# Patient Record
Sex: Female | Born: 1950 | Race: White | Hispanic: No | Marital: Married | Smoking: Former smoker
Health system: Southern US, Community
[De-identification: ages and names within clinical notes are randomized; demographics above are authoritative.]

## PROBLEM LIST (undated history)

## (undated) DIAGNOSIS — J90 Pleural effusion, not elsewhere classified: Secondary | ICD-10-CM

## (undated) DIAGNOSIS — I5042 Chronic combined systolic (congestive) and diastolic (congestive) heart failure: Secondary | ICD-10-CM

## (undated) DIAGNOSIS — I340 Nonrheumatic mitral (valve) insufficiency: Secondary | ICD-10-CM

## (undated) DIAGNOSIS — E119 Type 2 diabetes mellitus without complications: Secondary | ICD-10-CM

## (undated) DIAGNOSIS — N182 Chronic kidney disease, stage 2 (mild): Secondary | ICD-10-CM

## (undated) DIAGNOSIS — I10 Essential (primary) hypertension: Secondary | ICD-10-CM

## (undated) DIAGNOSIS — I272 Pulmonary hypertension, unspecified: Secondary | ICD-10-CM

## (undated) DIAGNOSIS — E785 Hyperlipidemia, unspecified: Secondary | ICD-10-CM

## (undated) DIAGNOSIS — I82409 Acute embolism and thrombosis of unspecified deep veins of unspecified lower extremity: Secondary | ICD-10-CM

## (undated) DIAGNOSIS — F32A Depression, unspecified: Secondary | ICD-10-CM

## (undated) DIAGNOSIS — R062 Wheezing: Secondary | ICD-10-CM

## (undated) DIAGNOSIS — I313 Pericardial effusion (noninflammatory): Secondary | ICD-10-CM

## (undated) DIAGNOSIS — I3139 Other pericardial effusion (noninflammatory): Secondary | ICD-10-CM

## (undated) DIAGNOSIS — I071 Rheumatic tricuspid insufficiency: Secondary | ICD-10-CM

## (undated) DIAGNOSIS — I5043 Acute on chronic combined systolic (congestive) and diastolic (congestive) heart failure: Secondary | ICD-10-CM

## (undated) DIAGNOSIS — D509 Iron deficiency anemia, unspecified: Secondary | ICD-10-CM

## (undated) DIAGNOSIS — F329 Major depressive disorder, single episode, unspecified: Secondary | ICD-10-CM

## (undated) HISTORY — PX: TONSILLECTOMY: SUR1361

## (undated) HISTORY — DX: Type 2 diabetes mellitus without complications: E11.9

## (undated) HISTORY — DX: Acute on chronic combined systolic (congestive) and diastolic (congestive) heart failure: I50.43

## (undated) HISTORY — DX: Essential (primary) hypertension: I10

## (undated) HISTORY — PX: APPENDECTOMY: SHX54

## (undated) HISTORY — PX: OTHER SURGICAL HISTORY: SHX169

## (undated) HISTORY — DX: Pleural effusion, not elsewhere classified: J90

---

## 2003-07-03 ENCOUNTER — Encounter (INDEPENDENT_AMBULATORY_CARE_PROVIDER_SITE_OTHER): Payer: Self-pay | Admitting: *Deleted

## 2003-07-03 ENCOUNTER — Encounter (INDEPENDENT_AMBULATORY_CARE_PROVIDER_SITE_OTHER): Payer: Self-pay | Admitting: Specialist

## 2003-07-03 ENCOUNTER — Ambulatory Visit (HOSPITAL_COMMUNITY): Admission: RE | Admit: 2003-07-03 | Discharge: 2003-07-03 | Payer: Self-pay | Admitting: Obstetrics and Gynecology

## 2004-05-17 ENCOUNTER — Other Ambulatory Visit: Admission: RE | Admit: 2004-05-17 | Discharge: 2004-05-17 | Payer: Self-pay | Admitting: Obstetrics and Gynecology

## 2006-07-17 ENCOUNTER — Encounter (INDEPENDENT_AMBULATORY_CARE_PROVIDER_SITE_OTHER): Payer: Self-pay | Admitting: *Deleted

## 2006-07-17 ENCOUNTER — Inpatient Hospital Stay (HOSPITAL_COMMUNITY): Admission: EM | Admit: 2006-07-17 | Discharge: 2006-07-21 | Payer: Self-pay | Admitting: Emergency Medicine

## 2008-04-14 IMAGING — CT CT ABDOMEN W/ CM
2 of 5 series · 13 of 32 positions shown, 18 images · IV contrast ([ID]/WATER & 100 ML OMNI 300)
Comparison: No comparison films available.

CLINICAL DATA: Abdominal and pelvic pain with right lower quadrant pain.  Nausea, 
ABDOMEN CT WITH CONTRAST:
TECHNIQUE: Multidetector CT imaging of the abdomen was performed following the standard protocol during bolus administration of intravenous contrast.
Contrast:  100 cc Omnipaque 300 and oral contrast.
TECHNIQUE: Multidetector CT imaging of the pelvis was performed following the standard protocol during bolus administration of intravenous contrast.

[Series 2: routine abdomen · axial · 0.75mm/px · z∈[-289,+1]mm · 5 of 88 slices shown, 10 images]
[im 15/88  soft-tissue]
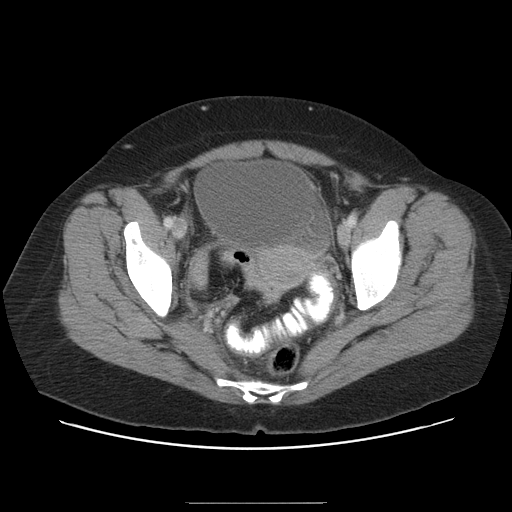
[im 15/88  bone]
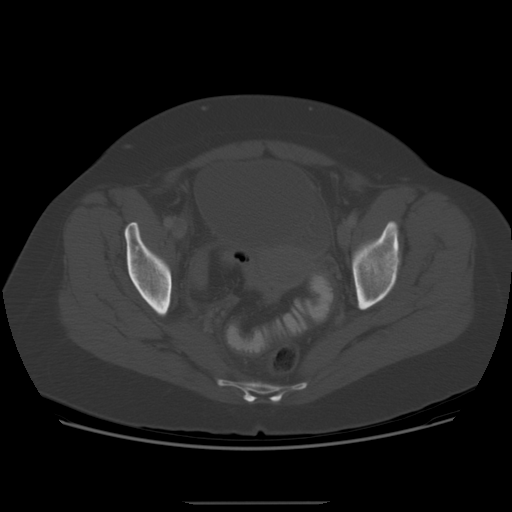
[im 30/88  soft-tissue]
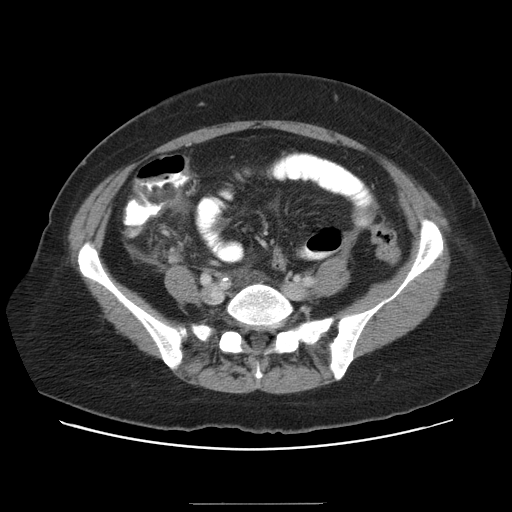
[im 30/88  lung]
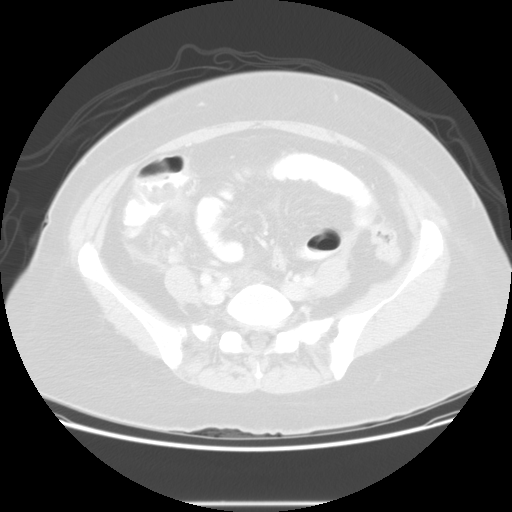
[im 44/88  soft-tissue]
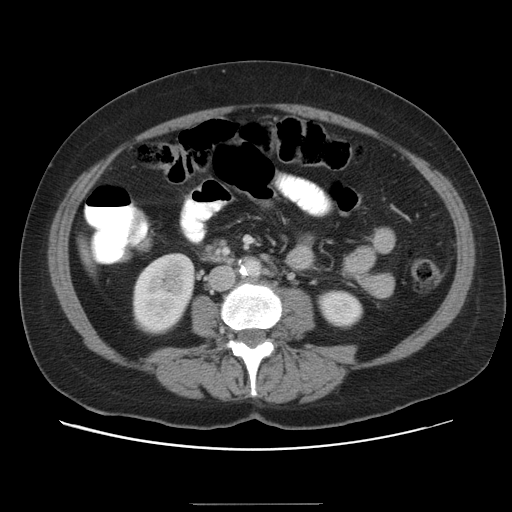
[im 44/88  lung]
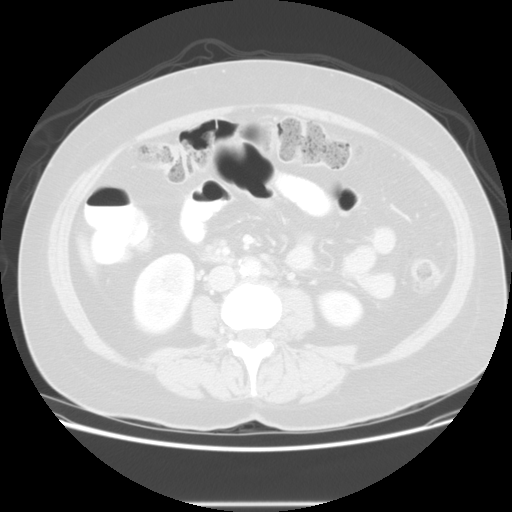
[im 59/88  soft-tissue]
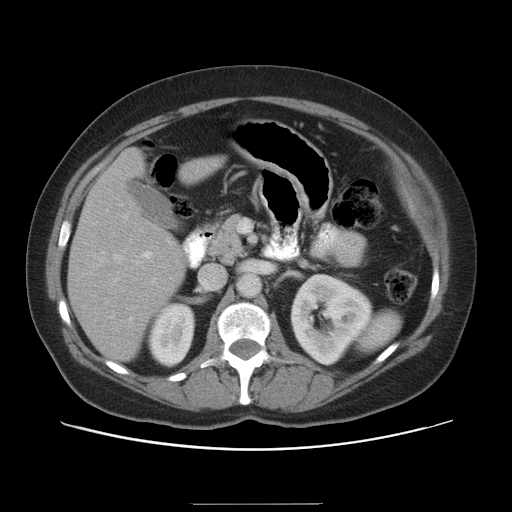
[im 59/88  lung]
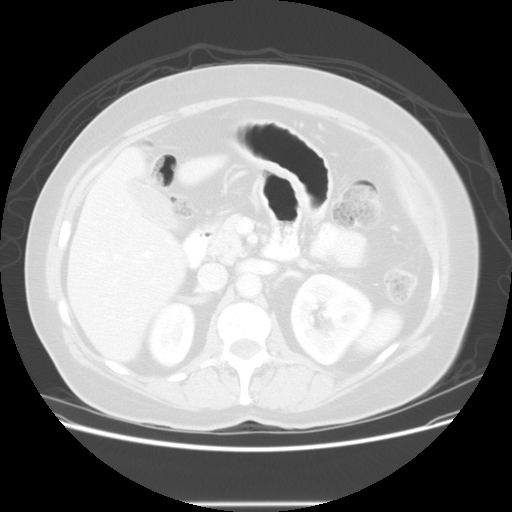
[im 73/88  soft-tissue]
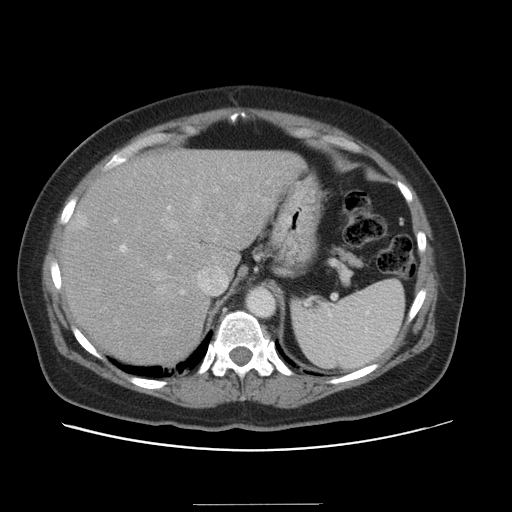
[im 73/88  lung]
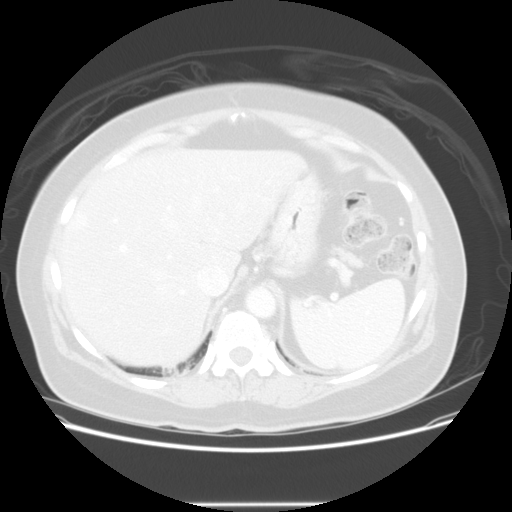

[Series 401: reformatted · sagittal · 0.78mm/px · 8 of 166 slices shown]
[im 16/166  soft-tissue]
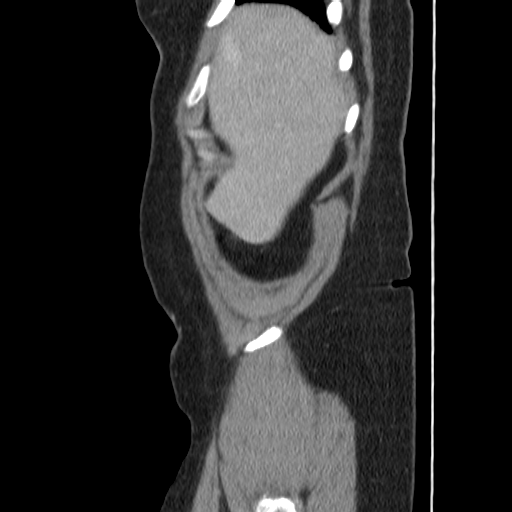
[im 31/166  soft-tissue]
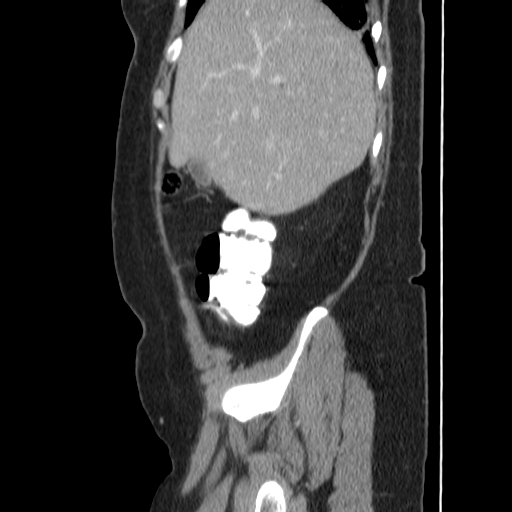
[im 61/166  soft-tissue]
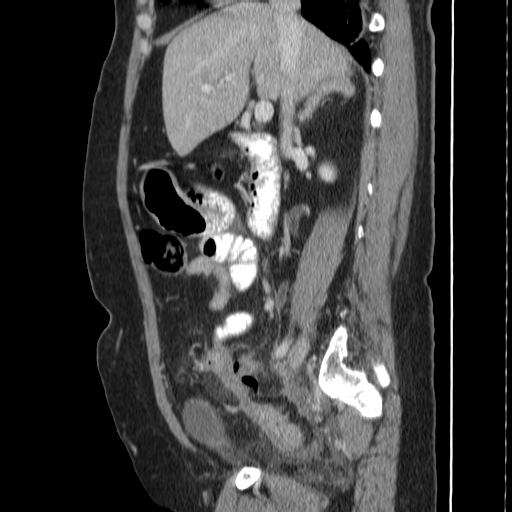
[im 76/166  soft-tissue]
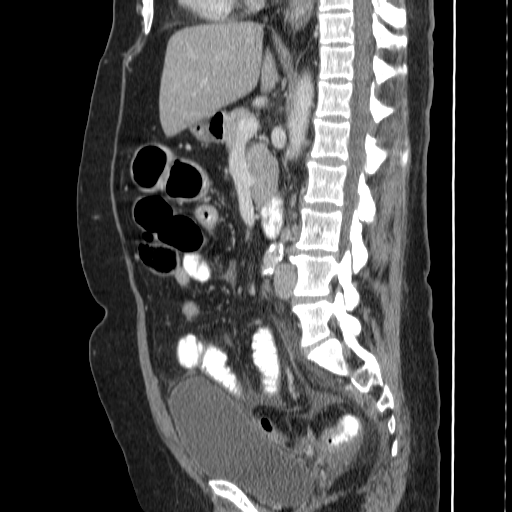
[im 91/166  soft-tissue]
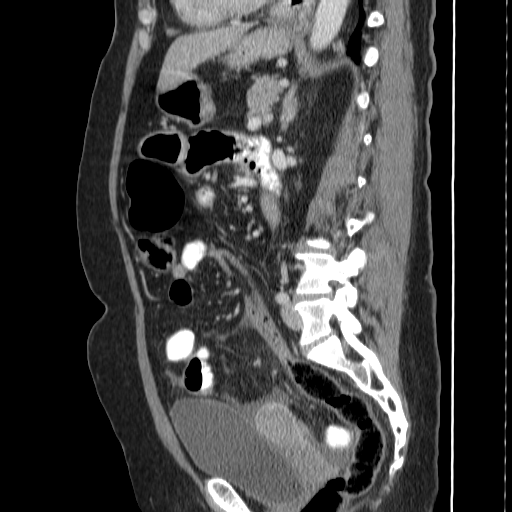
[im 106/166  soft-tissue]
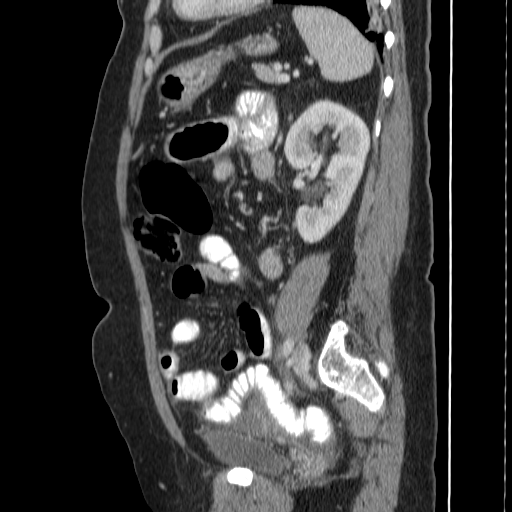
[im 136/166  soft-tissue]
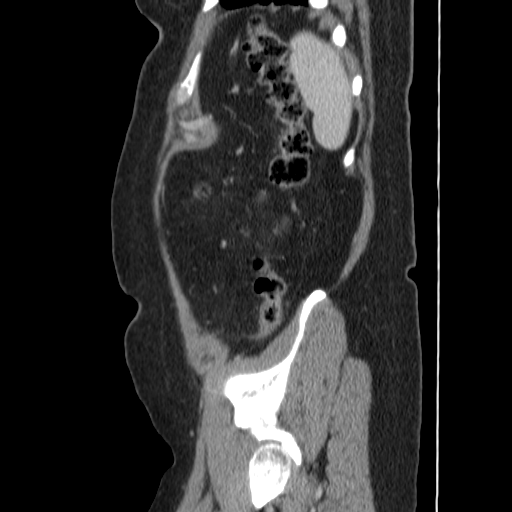
[im 151/166  soft-tissue]
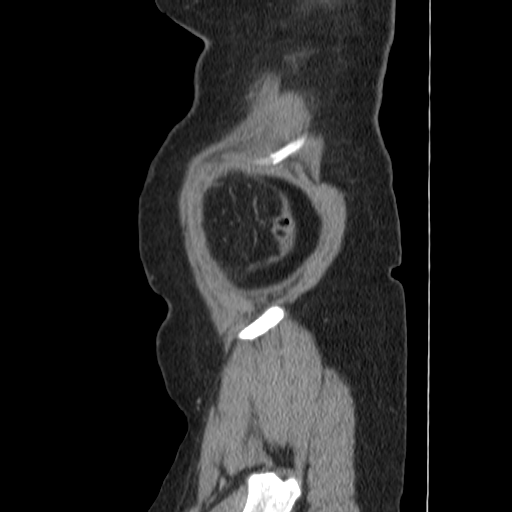

[13 of 32 positions shown; findings below may reference images not displayed]

FINDINGS: A few tiny low density hepatic lesions are too small to characterize, but probably represent cysts.  A 1.2 x 1.4 cm hyperdense/enhancing lesion within the anterior right hepatic segment (image 16) is indeterminate.  The remainder of the liver is unremarkable.  The spleen, adrenal glands, pancreas, gallbladder, and kidneys are unremarkable except for probable bilateral renal cysts.  Visualized bowel within the abdomen is unremarkable.  A small umbilical hernia containing fat is noted.  Moderate hiatal hernia and mild basilar atelectasis is identified.
IMPRESSION: 1.  No acute abnormality within the abdomen. 
2.  Moderate hiatal hernia.  
3.  Hepatic lesions as described.  
PELVIS CT WITH CONTRAST:
FINDINGS: There is inflammation within the right pelvis with enhancement of the appendiceal wall.  There is a small amount of free fluid in the right pelvis as well as a probable focus of pneumoperitoneum.  There is wall thickening of the cecum and distal ileum which may be reactive but nonspecific.  The bowel is within normal limits.
IMPRESSION: Findings compatible with appendicitis with probable perforation.  Wall thickening of the distal ileum and cecum may be reactive.  Dr. Toriso notified of these results at [DATE] a.m. on 07/17/06.

## 2008-04-14 IMAGING — CR DG CHEST 2V
2 series · 2 of 2 positions shown · non-contrast
Comparison: None.

CLINICAL DATA: Abdominal pain.  Preop appendectomy.
 CHEST ? 2 VIEW:

[w chest pa]
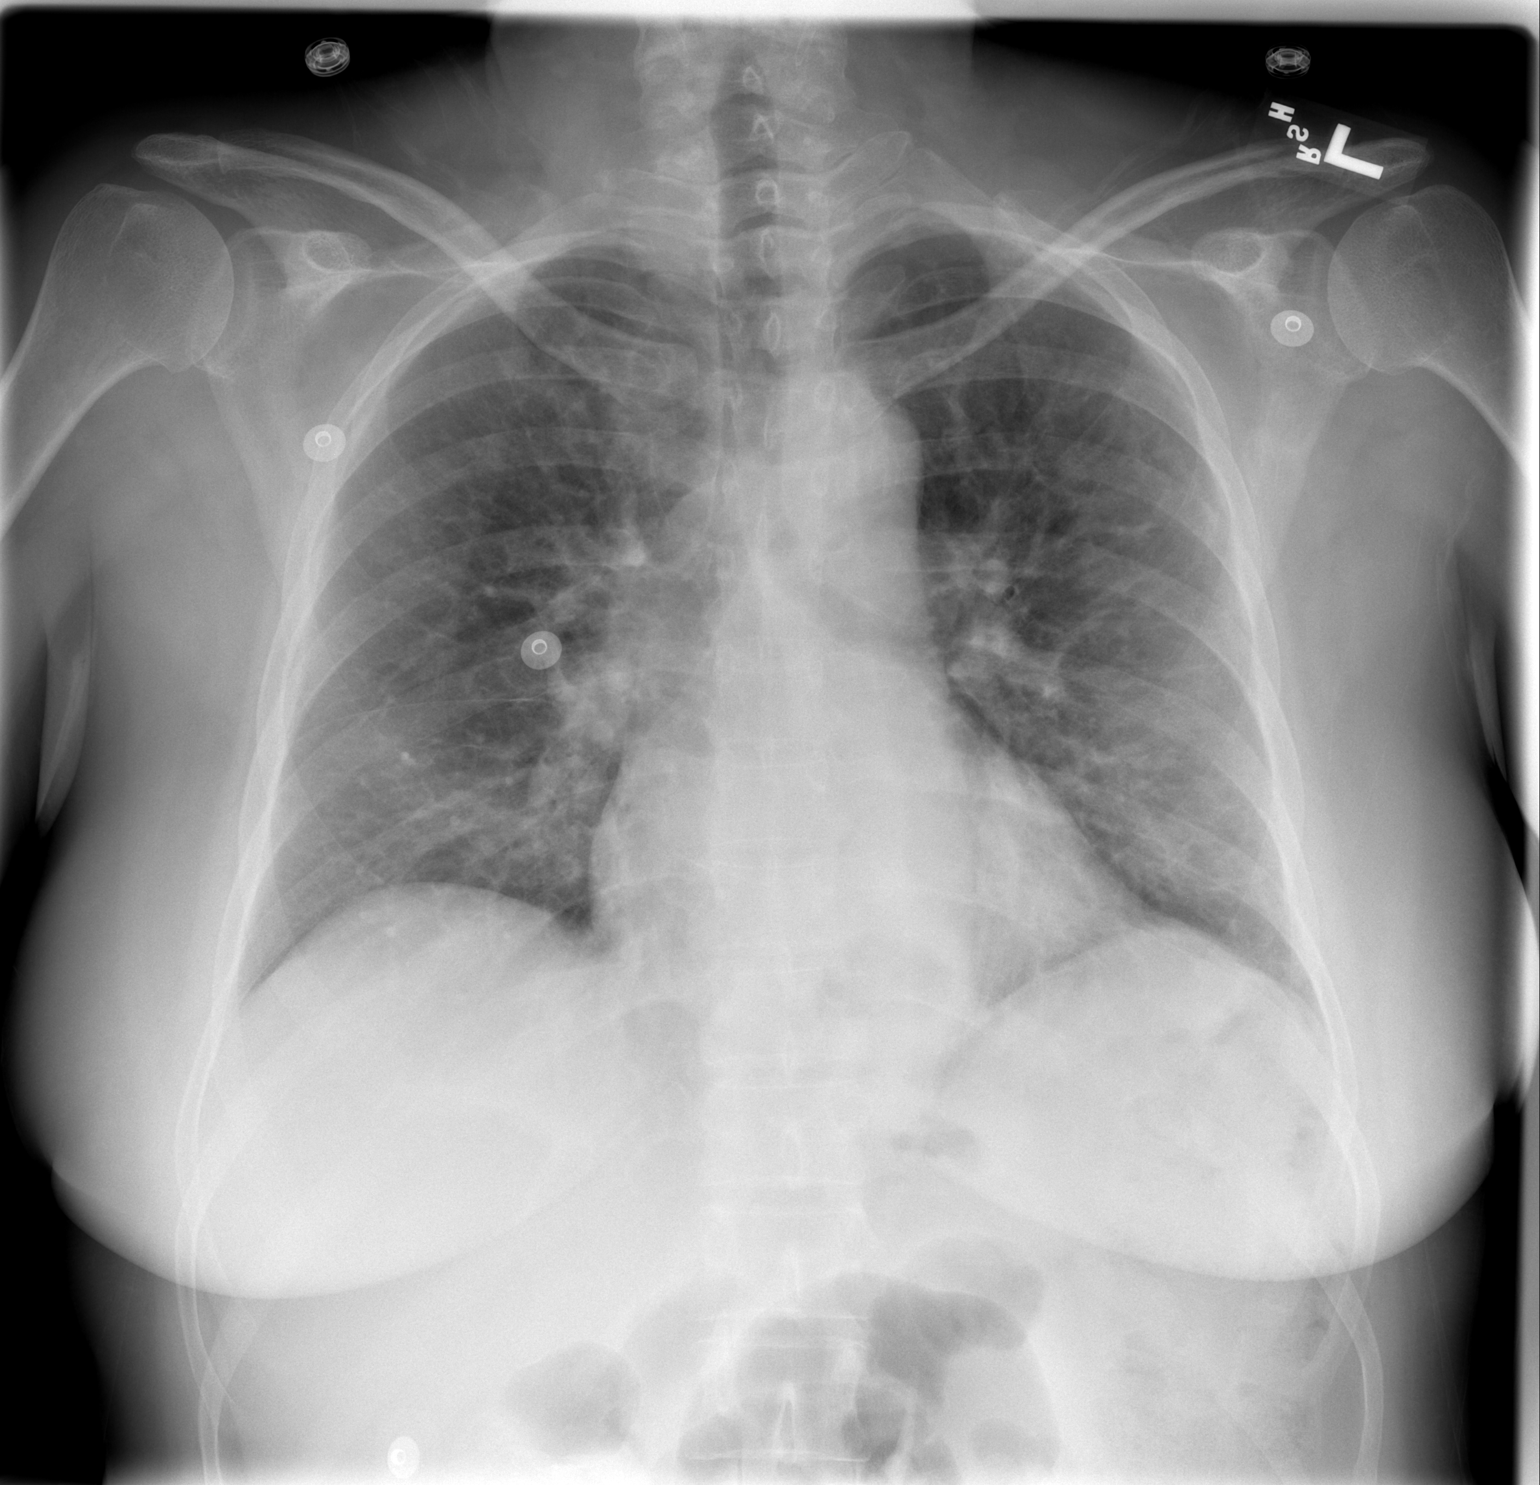

[w chest lat]
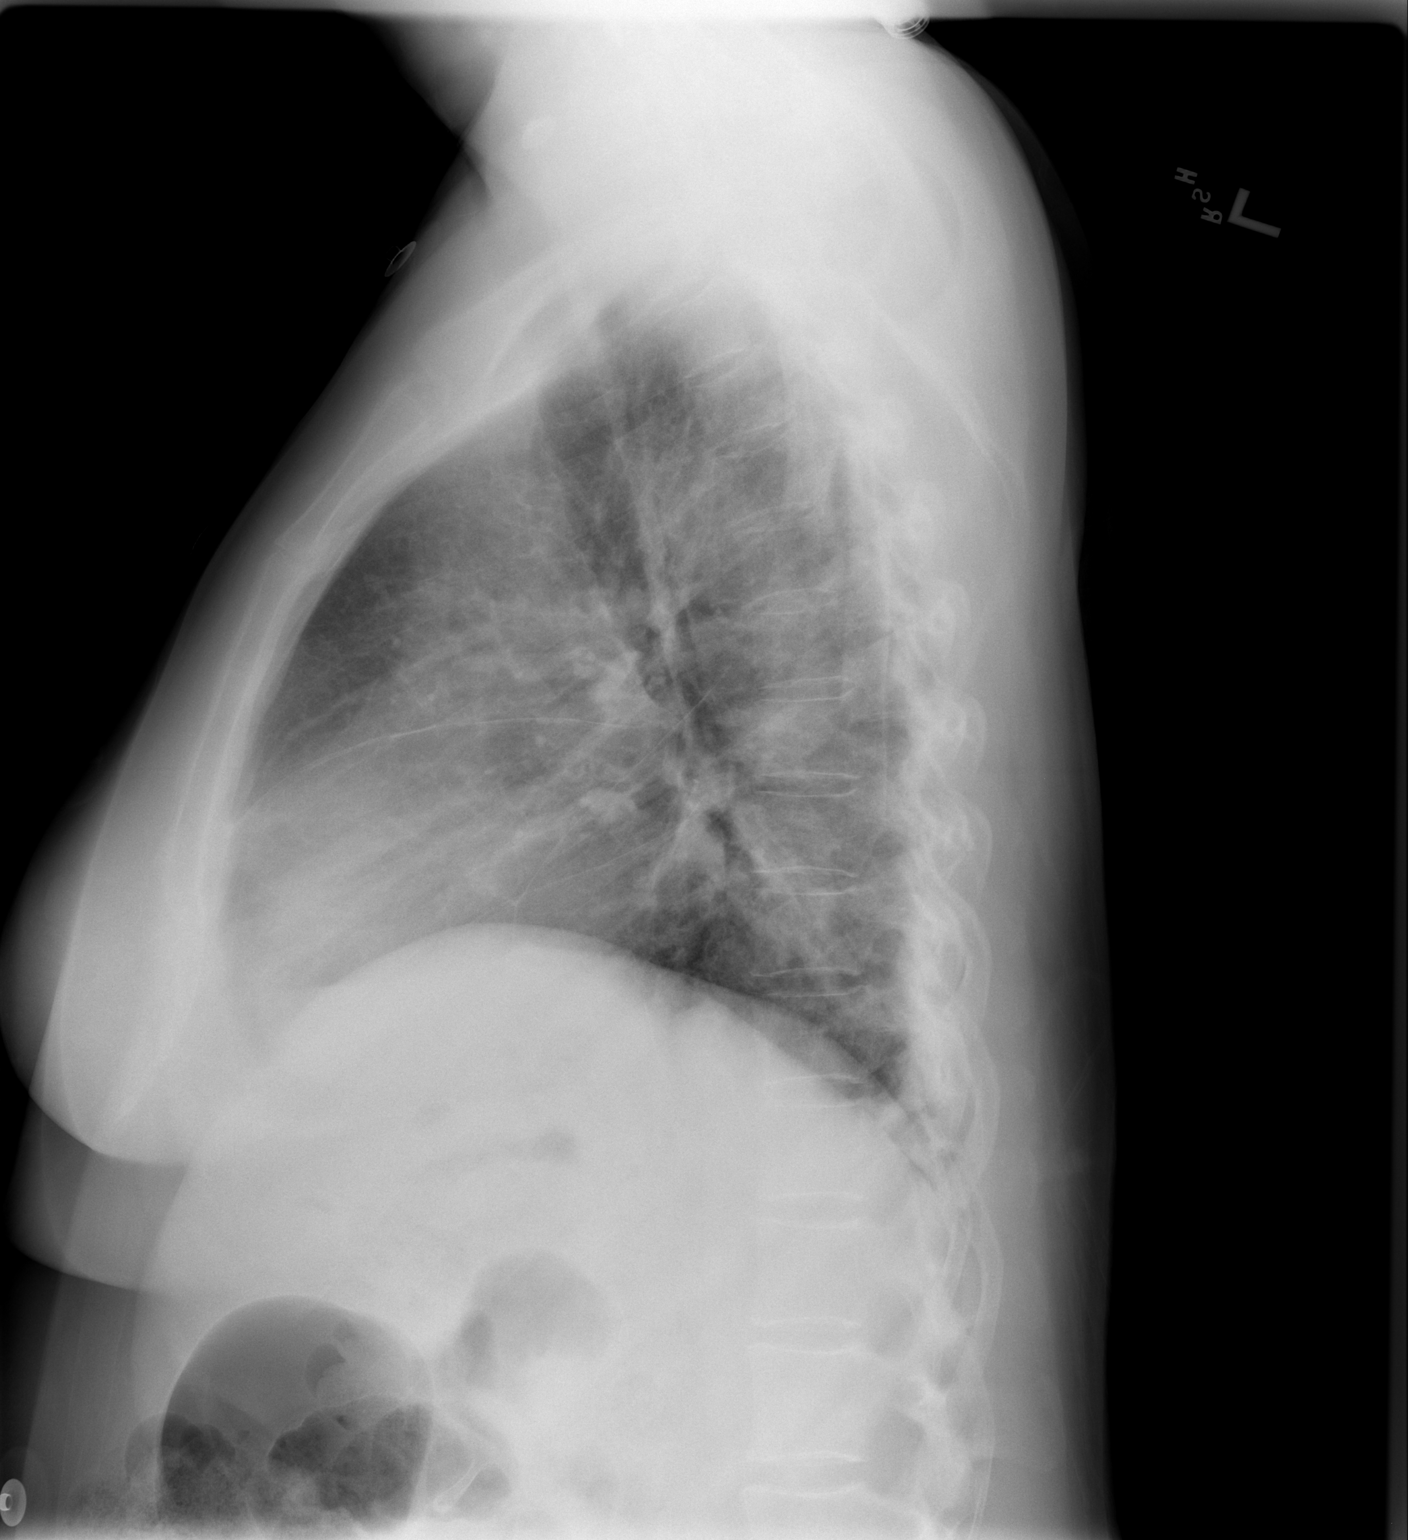

[2 of 2 positions shown; findings below may reference images not displayed]

FINDINGS: Heart size is normal.  Lung markings are prominent.  This could be due to chronic lung disease or possibly acute lung disease such as bronchitis.  No definite heart failure.  There is no effusion.  There is some mild left lower lobe atelectasis or infiltrate.
IMPRESSION: Prominent lung markings with some peribronchial thickening possibly chronic but no prior studies are available.  There is some mild left lower lobe airspace disease.

## 2010-03-23 ENCOUNTER — Encounter (INDEPENDENT_AMBULATORY_CARE_PROVIDER_SITE_OTHER): Payer: Self-pay | Admitting: *Deleted

## 2010-03-24 ENCOUNTER — Encounter: Payer: Self-pay | Admitting: Gastroenterology

## 2010-03-24 ENCOUNTER — Telehealth (INDEPENDENT_AMBULATORY_CARE_PROVIDER_SITE_OTHER): Payer: Self-pay | Admitting: *Deleted

## 2010-03-25 ENCOUNTER — Ambulatory Visit: Payer: Self-pay | Admitting: Gastroenterology

## 2010-03-25 ENCOUNTER — Encounter: Payer: Self-pay | Admitting: Gastroenterology

## 2010-03-25 ENCOUNTER — Encounter (INDEPENDENT_AMBULATORY_CARE_PROVIDER_SITE_OTHER): Payer: Self-pay | Admitting: *Deleted

## 2010-03-25 DIAGNOSIS — D649 Anemia, unspecified: Secondary | ICD-10-CM | POA: Insufficient documentation

## 2010-03-28 LAB — CONVERTED CEMR LAB
Basophils Relative: 0.4 % (ref 0.0–3.0)
Eosinophils Absolute: 0.1 10*3/uL (ref 0.0–0.7)
Eosinophils Relative: 1 % (ref 0.0–5.0)
Ferritin: 3 ng/mL — ABNORMAL LOW (ref 10.0–291.0)
IgA: 118 mg/dL (ref 68–378)
Lymphocytes Relative: 19.7 % (ref 12.0–46.0)
Monocytes Relative: 5.4 % (ref 3.0–12.0)
Neutrophils Relative %: 73.5 % (ref 43.0–77.0)
RBC: 3.84 M/uL — ABNORMAL LOW (ref 3.87–5.11)
Saturation Ratios: 3.3 % — ABNORMAL LOW (ref 20.0–50.0)
WBC: 12.9 10*3/uL — ABNORMAL HIGH (ref 4.5–10.5)

## 2010-03-29 ENCOUNTER — Ambulatory Visit (HOSPITAL_COMMUNITY)
Admission: RE | Admit: 2010-03-29 | Discharge: 2010-03-29 | Payer: Self-pay | Source: Home / Self Care | Attending: Gastroenterology | Admitting: Gastroenterology

## 2010-03-29 ENCOUNTER — Encounter: Payer: Self-pay | Admitting: Gastroenterology

## 2010-05-16 ENCOUNTER — Ambulatory Visit
Admission: RE | Admit: 2010-05-16 | Discharge: 2010-05-16 | Payer: Self-pay | Source: Home / Self Care | Attending: Gastroenterology | Admitting: Gastroenterology

## 2010-05-16 ENCOUNTER — Other Ambulatory Visit: Payer: Self-pay | Admitting: Gastroenterology

## 2010-05-16 LAB — CBC WITH DIFFERENTIAL/PLATELET
Basophils Relative: 0.7 % (ref 0.0–3.0)
HCT: 39.9 % (ref 36.0–46.0)
Hemoglobin: 12.7 g/dL (ref 12.0–15.0)
Lymphocytes Relative: 28 % (ref 12.0–46.0)
Lymphs Abs: 2.6 10*3/uL (ref 0.7–4.0)
Monocytes Relative: 6.3 % (ref 3.0–12.0)
Neutro Abs: 5.9 10*3/uL (ref 1.4–7.7)
RBC: 5.04 Mil/uL (ref 3.87–5.11)

## 2010-05-17 ENCOUNTER — Ambulatory Visit
Admission: RE | Admit: 2010-05-17 | Discharge: 2010-05-17 | Payer: Self-pay | Source: Home / Self Care | Attending: Gastroenterology | Admitting: Gastroenterology

## 2010-05-17 NOTE — Letter (Signed)
Summary: [facility] Instructions  [facility]  [address] [place], [place] [address]   Phone: [phone]  Fax: [phone]       [name]    [birth year]    MRN: [id number]        Procedure Day /Date:03/29/10 TUE     Arrival Time:730 am     Procedure Time:830 am     Location of Procedure:                     X  [facility] ( Outpatient Registration)                        PREPARATION FOR COLONOSCOPY WITH MOVIPREP   Starting 5 days prior to your procedure 12/8/11do not eat nuts, seeds, popcorn, corn, beans, peas,  salads, or any raw vegetables.  Do not take any fiber supplements (e.g. Metamucil, Citrucel, and Benefiber).  THE DAY BEFORE YOUR PROCEDURE         DATE: 03/28/10  DAY: MON  1.  Drink clear liquids the entire day-NO SOLID FOOD  2.  Do not drink anything colored red or purple.  Avoid juices with pulp.  No orange juice.  3.  Drink at least 64 oz. (8 glasses) of fluid/clear liquids during the day to prevent dehydration and help the prep work efficiently.  CLEAR LIQUIDS INCLUDE: Water Jello Ice Popsicles Tea (sugar ok, no milk/cream) Powdered fruit flavored drinks Coffee (sugar ok, no milk/cream) Gatorade Juice: apple, white grape, white cranberry  Lemonade Clear bullion, consomm, broth Carbonated beverages (any kind) Strained chicken noodle soup Hard Candy                             4.  In the morning, mix first dose of MoviPrep solution:    Empty 1 Pouch A and 1 Pouch B into the disposable container    Add lukewarm drinking water to the top line of the container. Mix to dissolve    Refrigerate (mixed solution should be used within 24 hrs)  5.  Begin drinking the prep at 5:00 p.m. The MoviPrep container is divided by 4 marks.   Every 15 minutes drink the solution down to the next mark (approximately 8 oz) until the full liter is complete.   6.  Follow completed prep with 16 oz of clear liquid of your choice (Nothing  red or purple).  Continue to drink clear liquids until bedtime.  7.  Before going to bed, mix second dose of MoviPrep solution:    Empty 1 Pouch A and 1 Pouch B into the disposable container    Add lukewarm drinking water to the top line of the container. Mix to dissolve    Refrigerate  THE DAY OF YOUR PROCEDURE      DATE: TUE DAY:03/29/10  Beginning at 330 a.m. (5 hours before procedure):         1. Every 15 minutes, drink the solution down to the next mark (approx 8 oz) until the full liter is complete.  2. Follow completed prep with 16 oz. of clear liquid of your choice.    3. You may drink clear liquids until 430 am (4 HOURS BEFORE PROCEDURE).   MEDICATION INSTRUCTIONS  Unless otherwise instructed, you should take regular prescription medications with a small sip of water   as early as possible the morning of your procedure.  OTHER INSTRUCTIONS  You will need a responsible adult at least 60 years of age to accompany you and drive you home.   This person must remain in the waiting room during your procedure.  Wear loose fitting clothing that is easily removed.  Leave jewelry and other valuables at home.  However, you may wish to bring a book to read or  an iPod/MP3 player to listen to music as you wait for your procedure to start.  Remove all body piercing jewelry and leave at home.  Total time from sign-in until discharge is approximately 2-3 hours.  You should go home directly after your procedure and rest.  You can resume normal activities the  day after your procedure.  The day of your procedure you should not:   Drive   Make legal decisions   Operate machinery   Drink alcohol   Return to work  You will receive specific instructions about eating, activities and medications before you leave.    The above instructions have been reviewed and explained to me by   _______________________    I fully understand and can verbalize these  instructions _____________________________ Date _________

## 2010-05-17 NOTE — Progress Notes (Signed)
Summary: Colon info needed  Phone Note Outgoing Call Call back at Home Phone [phone]   Call placed by: Chales Abrahams CMA Duncan Dull),  March 24, 2010 11:17 AM Summary of Call: in reviewing records pt states she had colon in 2000, placed a call to her to get the info so that we wil have it for her appt on friday with Dr Christella Hartigan.  message left with family member to have her call us. Initial call taken by: Chales Abrahams CMA Duncan Dull),  March 24, 2010 11:18 AM  Follow-up for Phone Call        pt does not remember who did her colon or where it was  Follow-up by: Chales Abrahams CMA Duncan Dull),  March 24, 2010 2:48 PM

## 2010-05-17 NOTE — Assessment & Plan Note (Signed)
History of Present Illness Visit Type: Initial Consult Primary GI MD: Rob Bunting MD Primary Provider: Dr Cheri Rous Requesting Provider: Dr Cheri Rous Chief Complaint: Patient here for further evaluation of low hemoglobin History of Present Illness:       very pleasant 60 year old woman who was found to have low Hb 7.8, mcv 70.  She was "partying" one night about a month ago and then went shopping the next morning, near syncope.  she eventually went to her primary care office and was found to have anemia.  She had vaginal spotting after intercourse recently.  No clear GI bleeding (no obvious hematemesis or red bleeding or black stools). She had a colonoscoyp  "a few polyps were found" and that things were fine.  She was told to have repeat in 5 years but didnt.    No changes in bowel habits.  No recent sob, no chest, + light headedness a  bit lately.    Overall stable weight lately.           Current Medications (verified): 1)  Atenolol 50 Mg Tabs (Atenolol) .... 1/2 Tab Once Daily 2)  Norvasc 10 Mg Tabs (Amlodipine Besylate) .Marland Kitchen.. 1 By Mouth Once Daily 3)  Zocor 20 Mg Tabs (Simvastatin) .Marland Kitchen.. 1 By Mouth At Bedtime 4)  Celexa 20 Mg Tabs (Citalopram Hydrobromide) .Marland Kitchen.. 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  All other review of systems was otherwise negative.   Vital Signs:  Patient profile:   61 year old female Height:      64  inches Weight:      173 pounds BMI:     29.80 BSA:     1.84 Pulse rate:   72 / minute Pulse rhythm:   regular BP sitting:   124 / 70  (left arm)  Vitals Entered By: Lamona Curl CMA Duncan Dull) (March 25, 2010 1:24 PM)  Physical Exam  Additional Exam:  Constitutional: generally well appearing Psychiatric: alert and oriented times 3 Eyes: extraocular movements intact Mouth: oropharynx moist, no lesions Neck: supple, no lymphadenopathy Cardiovascular: heart regular rate and rythm Lungs: CTA bilaterally Abdomen: soft, non-tender, non-distended, no obvious ascites, no peritoneal signs, normal bowel sounds Extremities: no lower extremity edema bilaterally Skin: no lesions on visible extremities    Impression & Recommendations:  Problem # 1:  significant anemia, microcytic she has been a bit lightheaded when she stands and so I offered blood transfusion of one or 2 units and she declined. She will instead begin taking iron supplements, over-the-counter twice daily. We will repeat some blood testing on her today including iron studies, CBC, celiac sprue testing. She really has had no clear GI bleeding however she should undergo colonoscopy and if that is negative we would proceed with EGD. We will do this early next week for her.  Other Orders: TLB-Iron, (Fe) Total (83540-FE) TLB-IBC Pnl (Iron/FE;Transferrin) (83550-IBC) TLB-Ferritin (82728-FER) T-Sprue Panel (Celiac Disease Aby Eval) (83516x3/[address]) TLB-IgA (Immunoglobulin A) (82784-IGA) TLB-CBC Platelet - w/Differential (  85025-CBCD)  Patient Instructions: 1)  You will be scheduled to have a colonoscopy +/- /EGD at [facility] next week. 2)  You will get lab test(s) done today (iron, tibc, ferritin, celiac panel, total IgA level, CBC). 3)  Since you declined blood transfusion, please start iron supplement  325mg  pills take 1 pill twice a day.  If you get worse dizziness or lightheadedness or SOB, or CP you need to go to closest  ER. 4)  A copy of this information will be sent to Dr. Egbert Garibaldi. 5)  The medication list was reviewed and reconciled.  All changed / newly prescribed medications were explained.  A complete medication list was provided to the patient / caregiver.  Appended Document: Orders Update/movi    Clinical Lists Changes  Medications: Added new medication of MOVIPREP 100 GM  SOLR (PEG-KCL-NACL-NASULF-NA ASC-C) As per prep instructions. - Signed Rx of MOVIPREP 100 GM  SOLR (PEG-KCL-NACL-NASULF-NA ASC-C) As per prep instructions.;  #1 x 0;  Signed;  Entered by: Chales Abrahams CMA (AAMA);  Authorized by: Rachael Fee MD;  Method used: Electronically to [facility]  [address] [phone]*, [address] [place], [place], [place]  [address], Ph: [phone] or [phone], Fax: [phone] Orders: Added new Test order of [facility] (Col/End [place]) - Signed    Prescriptions: MOVIPREP 100 GM  SOLR (PEG-KCL-NACL-NASULF-NA ASC-C) As per prep instructions.  #1 x 0   Entered by:   Chales Abrahams CMA (AAMA)   Authorized by:   Rachael Fee MD   Signed by:   Chales Abrahams CMA (AAMA) on 03/25/2010   Method used:   Electronically to        [facility]  [address] [phone]* (retail)       [address]       [place], [place]  [address]       Ph: [phone] or [phone]       Fax: [phone]   RxID:   [id number]

## 2010-05-17 NOTE — Letter (Signed)
Summary: New Patient letter  [facility]  [address] [place], [place] [address]   Phone: [phone]  Fax: [phone]       03/23/2010 MRN: [id number]  [name] [address] [place], [place]  [address]  Dear Ms. Newcombe,  Welcome to the Gastroenterology Division at [facility].    You are scheduled to see Dr. Christella Hartigan on 03/26/2011 at 1:30PM on the 3rd floor at [facility], [address]  We ask that you try to arrive at our office 15 minutes prior to your appointment time to allow for check-in.  We would like you to complete the enclosed self-administered evaluation form prior to your visit and bring it with you on the day of your appointment.  We will review it with you.  Also, please bring a complete list of all your medications or, if you prefer, bring the medication bottles and we will list them.  Please bring your insurance card so that we may make a copy of it.  If your insurance requires a referral to see a specialist, please bring your referral form from your primary care physician.  Co-payments are due at the time of your visit and may be paid by cash, check or credit card.     Your office visit will consist of a consult with your physician (includes a physical exam), any laboratory testing he/she may order, scheduling of any necessary diagnostic testing (e.g. x-ray, ultrasound, CT-scan), and scheduling of a procedure (e.g. Endoscopy, Colonoscopy) if required.  Please allow enough time on your schedule to allow for any/all of these possibilities.    If you cannot keep your appointment, please call [phone] to cancel or reschedule prior to your appointment date.  This allows [place] the opportunity to schedule an appointment for another patient in need of care.  If you do not cancel or reschedule by 5 p.m. the business day prior to your appointment date, you will be charged a $50.00 late cancellation/no-show fee.    Thank you for choosing  [facility] for your medical needs.  We appreciate the opportunity to care for you.  Please visit [place] at our website  to learn more about our practice.                     Sincerely,                                                             The Gastroenterology Division

## 2010-05-19 NOTE — Procedures (Signed)
Summary: Upper Endoscopy  Patient: [name] Note: All result statuses are Final unless otherwise noted.  Tests: (1) Upper Endoscopy (EGD)   EGD Upper Endoscopy       DONE     [facility]     [address] [place], [place]  [address]           ENDOSCOPY PROCEDURE REPORT           PATIENT:  [name], [name]  MR#:  [id number]     BIRTHDATE:  [birth year], 59 yrs. old  GENDER:  female     ENDOSCOPIST:  Rachael Fee, MD     Referred by:  Cheri Rous, M.D.     PROCEDURE DATE:  03/29/2010     PROCEDURE:  EGD with biopsy, [address]     ASA CLASS:  Class II     INDICATIONS:  iron def anemia     MEDICATIONS:   Fentanyl 25 mcg IV, Versed 2 mg IV     TOPICAL ANESTHETIC:  Cetacaine Spray           DESCRIPTION OF PROCEDURE:   After the risks benefits and     alternatives of the procedure were thoroughly explained, informed     consent was obtained.  The  endoscope was introduced through the     mouth and advanced to the second portion of the duodenum, without     limitations.  The instrument was slowly withdrawn as the mucosa     was fully examined.     <<PROCEDUREIMAGES>>     There was a 4cm hiatal hernia and several linear Cameron's type     erosions at "lip" of hernia (see image3, image2, and image9).     Otherwise the examination was normal. The duodenum was biopsied to     check for celiac sprue (sent to pathology) (see image7, image6,     and image5).    Retroflexed views revealed no abnormalities.     The scope was then withdrawn from the patient and the procedure     completed.     COMPLICATIONS:  None           ENDOSCOPIC IMPRESSION:     1) 4cm hiatal hernia with several linear Cameron's erosions     nearby.     2) Otherwise normal examination.  Duodenal mucosa was biopsied     to check for Celiac Sprue.           RECOMMENDATIONS:     Await pathology report.     The Cameron's type erosions MAY account for her iron def anemia.     She will continue  twice daily iron supplements for now and will     also start one daily OTC prilosec           ______________________________     Rachael Fee, MD           n.     eSIGNED:   Rachael Fee at 03/29/2010 09:24 AM           Jordan Hawks, [phone]  Note: An exclamation mark (!) indicates a result that was not dispersed into the flowsheet. Document Creation Date: 03/29/2010 9:24 AM _______________________________________________________________________  (1) Order result status: Final Collection or observation date-time: 03/29/2010 09:17 Requested date-time:  Receipt date-time:  Reported date-time:  Referring Physician:   Ordering Physician: Rob Bunting [phone]) Specimen Source:  Source: Launa Grill Order Number: [id number] Lab site:

## 2010-05-19 NOTE — Procedures (Signed)
Summary: Colonoscopy  Patient: [name] Note: All result statuses are Final unless otherwise noted.  Tests: (1) Colonoscopy (COL)   COL Colonoscopy           DONE     [facility]     [address] [place], [place]  [address]           COLONOSCOPY PROCEDURE REPORT           PATIENT:  [name], [name]  MR#:  [id number]     BIRTHDATE:  06-01-1950, 59 yrs. old  GENDER:  female     ENDOSCOPIST:  Rachael Fee, MD     REF. BY:  Cheri Rous, M.D.     PROCEDURE DATE:  03/29/2010     PROCEDURE:  Colonoscopy with snare polypectomy     ASA CLASS:  Class II     INDICATIONS:  microcytic anemia, no overt GI bleeding     MEDICATIONS:   Fentanyl 75 mcg IV, Versed 8 mg IV           DESCRIPTION OF PROCEDURE:   After the risks benefits and     alternatives of the procedure were thoroughly explained, informed     consent was obtained.  Digital rectal exam was performed and     revealed no rectal masses.   The Pentax Colonoscope [id number]     endoscope was introduced through the anus and advanced to the     terminal ileum which was intubated for a short distance, without     limitations.  The quality of the prep was good, using MoviPrep.     The instrument was then slowly withdrawn as the colon was fully     examined.     <<PROCEDUREIMAGES>>     FINDINGS:  A sessile polyp was found in the mid transverse colon.     This was 1cm across, removed with cold snare in piecemeal fashion     (see image4).  The terminal ileum appeared normal (see image3).     This was otherwise a normal examination of the colon (see image2,     image1, and image5).   Retroflexed views in the rectum revealed no     abnormalities.   The scope was then withdrawn from the patient and     the procedure completed.     COMPLICATIONS:  None           ENDOSCOPIC IMPRESSION:     1) Sessile polyp in the mid transverse colon, removed and sent     to pathology     2) Normal terminal ileum     3) Otherwise  normal examination; these findings do not explain     her IDA           RECOMMENDATIONS:     1) If the polyp removed today is proven to be adenomatous     (pre-cancerous) polyps, you will need a colonoscopy in 1 year     given piecmeal resection. Otherwise you should continue to follow     colorectal cancer screening guidelines for "routine risk" patients     with a colonoscopy in 10 years.     2) You will receive a letter within 1-2 weeks with the results     of your biopsy as well as final recommendations. Please call my     office if you have not received a letter after 3 weeks.  ______________________________     Rachael Fee, MD           n.     eSIGNED:   Rachael Fee at 03/29/2010 09:17 AM           Jordan Hawks, [id number]  Note: An exclamation mark (!) indicates a result that was not dispersed into the flowsheet. Document Creation Date: 03/29/2010 9:17 AM _______________________________________________________________________  (1) Order result status: Final Collection or observation date-time: 03/29/2010 09:00 Requested date-time:  Receipt date-time:  Reported date-time:  Referring Physician:   Ordering Physician: Rob Bunting [phone]) Specimen Source:  Source: Launa Grill Order Number: [id number] Lab site:

## 2010-05-19 NOTE — Procedures (Signed)
Summary: Instructions for procedure/[place]  Instructions for procedure/[place]   Imported By: Sherian Rein 03/30/2010 14:30:31  _____________________________________________________________________  External Attachment:    Type:   Image     Comment:   External Document

## 2010-05-25 NOTE — Assessment & Plan Note (Signed)
  Review of gastrointestinal problems: 1. Adenomatous colon polyps, colonoscopy December 2011, 1 cm, next colonoscopy at 3 year interval 2. iron deficiency anemia 3. hiatal hernia with Cameron's erosions, possibly causing her anemia, anemia improved with iron supplements and proton pump inhibitor    History of Present Illness Visit Type: Follow-up Visit Primary GI MD: Rob Bunting MD Primary Provider: Dr Cheri Rous Requesting Provider: Dr Cheri Rous Chief Complaint: follow-up review labs History of Present Illness:     very pleasant 60 year old woman whom I last saw about 6 weeks ago the time of upper and lower endoscopies. See those results summarized above. She has been on iron once daily and proton pump inhibitor once daily. Her repeat CBC shows normal hemoglobin. She feels better overall as well.           Current Medications (verified): 1)  Atenolol 50 Mg Tabs (Atenolol) .... 1/2 Tab Once Daily 2)  Norvasc 10 Mg Tabs (Amlodipine Besylate) .Marland Kitchen.. 1 By Mouth Once Daily 3)  Zocor 20 Mg Tabs (Simvastatin) .Marland Kitchen.. 1 By Mouth At Bedtime 4)  Celexa 20 Mg Tabs (Citalopram Hydrobromide) .Marland Kitchen.. 1 By Mouth Once Daily 5)  Ferrous Sulfate 325 (65 Fe) Mg Tabs (Ferrous Sulfate) .Marland Kitchen.. 1 By Mouth Once Daily 6)  Prilosec Otc 20 Mg Tbec (Omeprazole Magnesium) .Marland Kitchen.. 1 By Mouth Once Daily  Allergies (verified): No Known Drug Allergies  Vital Signs:  Patient profile:   61 year old female Height:      64 inches Weight:      171 pounds BMI:     29.46 Pulse rate:   84 / minute Pulse rhythm:   regular BP sitting:   108 / 66  (left arm)  Vitals Entered By: Milford Cage NCMA (May 17, 2010 3:55 PM)  Physical Exam  Additional Exam:  Constitutional: generally well appearing Psychiatric: alert and oriented times 3 Abdomen: soft, non-tender, non-distended, normal bowel sounds    Impression & Recommendations:  Problem # 1:  Cameron erosions, iron deficiency anemia her anemia is much  improved on iron replacement and proton pump inhibitor. We will repeat her CBC, iron studies in 3 months time and if her iron stores are normal then I will have her stop iron and observe her only on proton pump inhibitor with a repeat CBC 6 months from that point.  Patient Instructions: 1)  Stay on iron supplements once daily, take it at bedtime. 2)  Stay on prilosec once daily. 3)  Repeat cbc, iron, TIBC, ferritin, in 3 months. 4)  Will change colonoscopy recall to 3 years (not 5). 5)  A copy of this information will be sent to Dr. Egbert Garibaldi. 6)  The medication list was reviewed and reconciled.  All changed / newly prescribed medications were explained.  A complete medication list was provided to the patient / caregiver.

## 2010-07-18 ENCOUNTER — Other Ambulatory Visit: Payer: Self-pay | Admitting: Gastroenterology

## 2010-07-18 ENCOUNTER — Other Ambulatory Visit (INDEPENDENT_AMBULATORY_CARE_PROVIDER_SITE_OTHER): Payer: Self-pay

## 2010-07-18 DIAGNOSIS — D649 Anemia, unspecified: Secondary | ICD-10-CM

## 2010-07-18 LAB — IBC PANEL
Iron: 61 ug/dL (ref 42–145)
Saturation Ratios: 14.1 % — ABNORMAL LOW (ref 20.0–50.0)
Transferrin: 308.5 mg/dL (ref 212.0–360.0)

## 2010-07-18 LAB — FERRITIN: Ferritin: 10.9 ng/mL (ref 10.0–291.0)

## 2010-07-19 ENCOUNTER — Telehealth: Payer: Self-pay

## 2010-07-19 DIAGNOSIS — D649 Anemia, unspecified: Secondary | ICD-10-CM

## 2010-07-19 NOTE — Telephone Encounter (Signed)
Left message with a family member to have her call at her earliest convenience

## 2010-07-19 NOTE — Telephone Encounter (Signed)
Message copied by Chales Abrahams on Tue Jul 19, 2010  4:19 PM ------      Message from: Rob Bunting      Created: Tue Jul 19, 2010 12:53 PM                   Please call the patient.  Iron studies improved but still on low side, she needs to stay on iron once daily.  Should have cbc, iron, ferritin checked in 6 months.

## 2010-07-19 NOTE — Progress Notes (Signed)
See 07/19/10 phone note for additional info

## 2010-07-19 NOTE — Progress Notes (Signed)
See phone note 07/19/10 

## 2010-07-20 ENCOUNTER — Telehealth: Payer: Self-pay

## 2010-07-20 NOTE — Telephone Encounter (Signed)
error 

## 2010-07-20 NOTE — Telephone Encounter (Signed)
Pt aware of labs and recall labs have been entered into EPIC

## 2010-09-02 NOTE — Consult Note (Signed)
[name], STEPPE NO.:  [id number]   MEDICAL RECORD NO.:  [id number]          PATIENT TYPE:  EMS   LOCATION:  MAJO                         FACILITY:  [facility]   PHYSICIAN:  Sandria Bales. Ezzard Standing, M.D.  DATE OF BIRTH:  March 27, 1951   DATE OF CONSULTATION:  DATE OF DISCHARGE:                                 CONSULTATION   HISTORY OF ILLNESS:  Ms. Soler is a 60 year old white female who is a  patient of Dr. Egbert Garibaldi of [place], [place].  She developed abdominal pain,  which began on Saturday, July 14, 2006.  She tried taking some  medicines at home and even went to work yesterday.  However, after going  to work her daughter brought her to the [facility] emergency room where she  has been evaluated for worsening abdominal pain.  She tried taking some  hydrocodone at home, which helped her pain maybe just a little bit.   She denied any history of peptic ulcer disease or liver disease,  gallbladder disease, pancreatic disease, or colon disease.  Her only  prior abdominal surgery was a removal of both ovaries by Dr. Lonell Face approximately 2005.   PAST MEDICAL HISTORY:   ALLERGIES:  She has no allergies.   CURRENT MEDICATIONS:  1. Celexa, she takes 5 mg daily.  2. Norvasc daily.  3. Zocor daily.   REVIEW OF SYSTEMS:  NEUROLOGIC:  There is no seizure or loss of  consciousness.  PULMONARY:  No history of pneumonia.  She has had some bronchitis in the  past. She smokes about a pack of cigarettes a day and knows that this is  bad for her health.  CARDIAC:  She has been hypertensive for about ten years but she has  never had any cardiac evaluation, chest pain, or angina.  GASTROINTESTINAL:  See history of present illness.  UROLOGIC:  No signs of kidney infections.  GYN:  Again, she underwent a bilateral laparoscopic oophorectomy by Dr.  Lonell Face, in March of 2005.   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 She is by herself in the  room.  Her husband apparently  works the 2nd shift and her daughter is  not here either.   PHYSICAL EXAMINATION:  VITAL SIGNS:  Her temperature is 98.5.  Blood  pressure is 114/73.  Pulse is 85.  GENERAL: She is a well-nourished, pleasant, white female, alert and  cooperative.  HEENT:  Unremarkable.  No obvious oral lesions.  NECK:  Supple.  No mass, no thyromegaly.  LYMPH NODES:  She has no supraclavicular or inguinal adenopathy.  BREASTS:  I did not examine her breasts. She has had a mammogram within  the last month.  LUNGS:  Clear to auscultation with symmetric breath sounds.  HEART:  Regular rate and rhythm.  I hear no murmur or rub.  ABDOMEN:  Distended.  She is diffusely tender but more so in the lower  abdomen with guarding and rebound. She actually has her right leg flexed  because it hurts to straighten out her leg.  EXTREMITIES:  She has good strength in her upper and  lower extremities  though again her right leg is flexed because of the pain in her abdomen.   LABORATORY DATA:  Labs that I have show a white blood count of 17,100  with 89% neutrophils.  Her hemoglobin 16.  Hematocrit 46.  Her sodium is  135.  Potassium 3.6.  Chloride of 102.  CO2 of 23.  Glucose of 194.  Creatinine of 0.9.  Her blood alcohol is less than 5.  Her lipase is 14.  Her PT is 14.9 with an INR of 1.1.   Her CT scan shows fluid around her appendix with some questionable free  air consistent with possible rupture appendicitis.   IMPRESSION:  1. Probable rupture appendicitis.  Discussed with the patient and      indications for potential complication of surgery that if she needs      to have an appendectomy, that I can do this laparoscopically.  I discussed that about 50% of the time in the case of ruptured appendix  that she may need open surgery and that will be determined at the time  or surgery.  The risks include bleeding, partial bowel resection, and  open surgery.  1. Smokes cigarettes.  2. Hypertension.  3.  Depression.  4. Hypercholesterolemia.      Sandria Bales. Ezzard Standing, M.D.  Electronically Signed     DHN/MEDQ  D:  07/17/2006  T:  07/17/2006  Job:  [id number]   cc:   Cheri Rous

## 2010-09-02 NOTE — Op Note (Signed)
[name], [name]            ACCOUNT NO.:  [id number]   MEDICAL RECORD NO.:  [id number]          Chung TYPE:  INP   LOCATION:  2550                         FACILITY:  [facility]   PHYSICIAN:  Sandria Bales. Ezzard Standing, M.D.  DATE OF BIRTH:  Apr 11, 1951   DATE OF PROCEDURE:  07/17/2006  DATE OF DISCHARGE:                               OPERATIVE REPORT   PREOP DIAGNOSIS:  Appendicitis   POSTOP DIAGNOSIS:  Ruptured Appendicitis, significant contamination   SURGERY:  Laparoscopic Appendectomy   SURGEON:  Ovidio Kin   ANESTHESIA:  General Endotracheal   ESTIMATED BLOOD LOSS:  Minimal   HISTORY OF ILLNESS:  This is a 60 year old white female who had about a  3-day history of abdominal pain and had presented to [facility]  Emergency Room where a CT scan has ruled a probable ruptured  appendicitis.  Discussion carried out with [name] and her husband  with Carol indication and potential complication of surgery.   Potential complications include bleeding, infection which she already  has, Carol possibility of bowel resection, possibility of open surgery.   OPERATIVE NOTE:  [name] placed in supine position.  She was given  Zosyn at Carol beginning of Carol operation.  She had a Foley catheter in  place, PAS stockings in place.  Her abdomen was prepped with Betadine  solution and sterilely draped.   An infraumbilical incision was made with sharp dissection carried down  to Carol abdominal cavity.  A 0-degree 10-mm laparoscope was inserted  through a 12-mm Hasson trocar.  I then placed a 5-mm trocar in Carol right  upper quadrant and a 5-mm trocar in Carol right lower quadrant, and a 10-  11 in Carol left lower quadrant.  I explored Carol lower abdomen, where I  identified purulent material consistent with a ruptured appendix.   She had had a prior oophorectomy by Dr. Lonell Face approximately 3  years ago and because of this, her cecum was plastered along her right  pelvic brim.  Her small  bowel was plastered along Carol former location of  tubo-ovarian complex around her right pelvis.  However, I was able to  tease this small bowel off Carol pelvic wall and behind this was a  ruptured appendix, which had been walled off in part by Carol appendix  with Carol tip all Carol way down to Carol bottom of Carol pelvis.   After mobilizing Carol small bowel, I was able to free up Carol base of Carol  appendix.  I took Carol mesentery of Carol appendix down with a harmonic  scalpel down to Carol base of Carol appendix, where I fired a Endo GIA 45-mm  vascular stapler and removed Carol appendix and sent it to pathology.   [name] had a lot of pus in Carol pelvis.  I was able to free up all  Carol loops of bowel.  I was able to irrigate this with about 5 liters of  saline.  Most interloop abscesses were down to Carol pelvis, but there  were some more mid abdomen and Carol omentum was sort of stuck  on this  also.  I tried to move and separate each loop of bowel, cleaning it out  with irrigation and trying to leave no loculated abscesses left behind.   Carol right and left lobes of Carol liver were unremarkable.  Carol stomach  was unremarkable.  Carol gallbladder appeared normal.  Most of her upper  abdomen was pretty much spared from this infection.   I did obtain aerobic and anaerobic cultures from Carol pelvic fluid and  these are pending at Carol time of this dictation.  After irrigating Carol  abdomen with 5 liters and rolling [name] in all directions to break  up these interloop abscesses, I then removed Carol trocars in turn.  There  is no bleeding at any trocar site.  Carol umbilical port was closed with 0  Vicryl suture.  Carol appendix port was closed with a 5-0 Vicryl suture,  painted with tincture of benzoin and Steri-Stripped.   Sponge and needle count were correct at Carol end of Carol case.  Carol  Chung will probably be in Carol hospital several days for this ruptured  appendix and certainly is at increased risk for  forming intraabdominal  abscesses.  Her sponge and needle count were correct at Carol end of Carol  case.      Sandria Bales. Ezzard Standing, M.D.  Electronically Signed     DHN/MEDQ  D:  07/17/2006  T:  07/17/2006  Job:  [id number]   cc:   Cheri Rous

## 2010-09-02 NOTE — Discharge Summary (Signed)
[name], [name]            ACCOUNT NO.:  [id number]   MEDICAL RECORD NO.:  [id number]          PATIENT TYPE:  INP   LOCATION:  5705                         FACILITY:  [facility]   PHYSICIAN:  Dorisann Frames, M.D.   DATE OF BIRTH:  [birth year]   DATE OF ADMISSION:  07/17/2006  DATE OF DISCHARGE:  07/21/2006                               DISCHARGE SUMMARY   CHIEF COMPLAINT/REASON FOR ADMISSION:  [name] is a 60 year old  female patient who developed abdominal pain, several days prior to  admission.  She presented to the ER where she was afebrile, vital signs  were stable.  Her abdomen was distended and diffusely tender, worse on  the lower abdomen with guarding and rebound, increased pain with  straightening her legs.  White count was 17,100 with neutrophil count of  89%.  CT scan was performed which showed fluid around the appendix and  questionable free air consistent with possible rupture of the appendix;  therefore, surgical services was consulted, and she was admitted with  the following diagnoses.  1. Possible perforated appendicitis.  2. Tobacco abuse.  3. Hypertension.  4. Depression.  5. Hypercholesterolemia.   HOSPITAL COURSE:  The patient was taken directly from the ER to the OR  by Dr. Ezzard Standing, where she underwent laparoscopic appendectomy for  ruptured appendicitis and drainage of multiple interloop abscesses.  She  is in stable condition and sent to the general floor to recover.   Post-op day #1, abdomen was soft and distended.  She had bowel sounds,  but she was diffusely tender without guarding.  She was also somewhat  congested on pulmonary exam, was performing incentive spirometry up to  1000 mL; therefore, an aggressive pulmonary toileting was initiated.  In  addition, by the afternoon of post-op day #1, where she had previously  been having good bowel sounds in the morning.  By Dr. Allene Pyo  evaluation, she had developed an ileus.  Her abdomen was quiet  and  distended.   By post-op day #2, her abdomen was softer, less distended.  She did have  bowel sounds, and she reported feeling much better with decreased pain.  She had improved on incentive spirometer up to 1500 mL.  She was  tolerating clear liquids, so she was advanced to full liquids with the  possibility of advancing to regular food at supper, if tolerated.  She  was continued on IV Zosyn.  She did have a urinalysis which was positive  for E-coli resistant to ampicillin, Cipro, Levaquin, Septra, but  sensitive to Rocephin, cefazolin.  This was discussed with PharmD, and  they recommended continuing the Zosyn, since she was already on that.  She was also on DVT prophylaxis.  Her blood pressure was well  controlled.   Subsequent pathology returned back with no malignancy.  Her white count  had now normalized to 10,208, hemoglobin 12.6.  She was tolerating a  solid diet but having early satiety.  Her abdomen soft and slightly  distended, but she still had positive bowel sounds, and her incision was  clean, dry and intact.   By post-op day #4,  she was deemed appropriate for discharge home on pain  medications and on Augmentin therapy.   FINAL DISCHARGE DIAGNOSES:  1. Abdominal pain secondary to perforated appendix.  2. Status post laparoscopic appendectomy by Dr. Ezzard Standing.  3. Hypertension controlled.  4. Tobacco abuse.   DISCHARGE MEDICATIONS:  1. Vicodin 5/325, 1-2 tablets every 4 hours as needed for pain.  2. Celexa 5 mg daily.  3. Norvasc 10 mg daily.  4. Zocor as before.  5. Ibuprofen 600 mg every 8 hours.  6. Protonix 40 mg daily while taking ibuprofen.  7. Return to work in 2 weeks.   ACTIVITY:  Increase activity slowly.  May walk up steps, may shower, no  lifting for 2 weeks greater than 15 pounds, no driving while taking  Vicodin.   DIET:  Low sodium heart-healthy.   WOUND CARE:  Allow Steri-Strips to fall off.   FOLLOW-UP APPOINTMENT:  She will need to see  Dr. Ezzard Standing in 2 weeks,  needs to call for that appointment.   OTHER INSTRUCTIONS:  Call the surgeon if:  a.  Fever greater than 101 degrees Fahrenheit.  b.  New or increased belly pain.  c.  Nausea, vomiting or diarrhea.  d.  Redness or drainage from wounds.      Sandria Bales. Ezzard Standing, M.D.  Electronically Signed     ______________________________  Dorisann Frames, M.D.    DHN/MEDQ  D:  09/04/2006  T:  09/04/2006  Job:  [id number]   cc:   Sandria Bales. Ezzard Standing, M.D.  Cheri Rous

## 2010-09-02 NOTE — Op Note (Signed)
NAME:  [name], [name]                      ACCOUNT NO.:  [id number]   MEDICAL RECORD NO.:  [id number]                   PATIENT TYPE:  AMB   LOCATION:  SDC                                  FACILITY:  [facility]   PHYSICIAN:  James A. Ashley Royalty, M.D.             DATE OF BIRTH:  January 02, 1951   DATE OF PROCEDURE:  07/03/2003  DATE OF DISCHARGE:                                 OPERATIVE REPORT   PREOPERATIVE DIAGNOSIS:  Bilateral ovarian cyst -- persistent.   POSTOPERATIVE DIAGNOSIS:  Bilateral ovarian cyst, persistent.  Pap is  pending.   PROCEDURE:  Diagnostic/operative laparoscopy with bilateral salpingo-  oophorectomy.   SURGEON:  Rudy Jew. Ashley Royalty, M.D.   ANESTHESIA:  General.   ESTIMATED BLOOD LOSS:  25 cc.   COMPLICATIONS:  None.   PACKS AND DRAINS:  None.   DESCRIPTION OF PROCEDURE:  The patient was taken to the operating room and  placed in the dorsal supine position.  After adequate general anesthesia was  administered, she was placed in the lithotomy position and prepped and  draped in the usual manner for abdominal and vaginal surgery.  A posterior  weighted retractor was placed per vagina.  The anterior lip of the cervix  was grasped with a single-toothed tenaculum.  Jarcho uterine manipulator was  placed by the cervix, and nailed in place with a tenaculum.  The bladder was  drained with a red rubber catheter.   A 1.5 cm infraumbilical incision was made in the longitudinal plane.  A size  10-11 disposable laparoscopic trocar was placed in the abdominal cavity  without difficulty.  The location was verified by placement of the  laparoscope.  There was no evidence of any trauma.  Pneumoperitoneum was  created with CO2 and maintained throughout the procedure.  Next, 5 mm  suprapubic trocars were placed in the left and right lower quadrants  respectively.  Enhanced illumination and direct visualization techniques  were employed.  The pelvis was then thoroughly  surveyed.   The uterus was normal size, shape and contour; without evidence of any  fibroids or endometriosis.  The left ovary was 6-7 cm in greatest diameter,  with a huge cyst present on it.  The left fallopian tube was normal size,  shape, contour and length; intimately associated with the left ovary.  The  right ovary was approximately 4 cm in greatest diameter, with a 2+ cm cyst  present on it.  The right fallopian tube was normal in size, shape, contour  and length, as were fimbria.  It was intimately associated with the right  ovary.  The anterior and posterior cul-de-sacs were clean.  The remainder of  the peritoneal surfaces were smooth and glistening.  Cytologic washings were  obtained with a Najat suction irrigator, and submitted separately to  Cytology.   Due to the patient's age and persistence of the cysts bilaterally, decision  was made to proceed with bilateral salpingo-oophorectomy.  The left  infundibulopelvic ligament was isolated and coagulated, then cut with the  tripolar cautery.  The mesosalpinx was similarly coagulated and cut.  _________ for releasing the left tube and ovary from its attachments.  The  left tube was coagulated and the uterine cornua excised.  The left tube and  ovary was thus completely released from its attachments and placed in the  cul-de-sac for later retrieval.  Then, the identical procedure was employed  on the right, to release the ovary and fallopian tube from its attachments.  The infundibulopelvic ligament was coagulated with the bipolar and divided  with a tripolar cutting vice. The fallopian tube was released from the  uterine cornua and the attached specimen released from its attachments and  placed in the cul-de-sac for later retrieval.  Hemostasis was noted.   At this point the left inferior port was enlarged to a 10 mm size, in order  to accommodate the Endo catch bag.  The right tube and ovary was placed in  the Endo catch bag  and brought out through the left inferior incision.  Submitted then for pathology and histologic studies.  Next, the left tube  and ovary was reduced in size by aspirating fluid with a needle.  It was  placed in the Endo catch bag and removed without difficulty.  Submitted to  pathology for histologic studies.   After this point the patient was felt to have benefited maximally from the  surgical procedure.  The abdominal instruments were removed and  pneumoperitoneum evacuated.  Fascial defects were closed with 0 Vicryl in a  running fashion on the left inferior port, and in an interrupted fashion at  the other sites.  The skin was closed with 3-0 Monocryl in a subcuticular  fashion.  The vaginal instruments were removed.  The procedure then  terminated.   Approximately 10 cc of 0.5% Marcaine with 1:200,000 epinephrine was  instilled into the surgical incisions to aid in postoperative analgesia.  The procedure was terminated. The patient was then taken to the recovery  room in excellent condition.                                               James A. Ashley Royalty, M.D.    JAM/MEDQ  D:  07/03/2003  T:  07/06/2003  Job:  [id number]

## 2010-09-02 NOTE — H&P (Signed)
NAMEMERLY, HINKSON NO.:  [id number]   MEDICAL RECORD NO.:  [id number]                   PATIENT TYPE:   LOCATION:                                       FACILITY:   PHYSICIAN:  Rudy Jew. Ashley Royalty, M.D.             DATE OF BIRTH:   DATE OF ADMISSION:  DATE OF DISCHARGE:                                HISTORY & PHYSICAL   DATE OF SCHEDULED PROCEDURE:  July 03, 2003   HISTORY OF PRESENT ILLNESS:  This is a 60 year old gravida 3 para 2 AB 1 who  presented to me April 30, 2003 for evaluation of an apparent ovarian cyst.  She is some 5 years postmenopausal.  She brought in with her records of a CT  scan performed at [facility] February 18, 2003 revealing a 5.4 cm cyst  in the left adnexa and a 2.1 cm cyst in the right adnexa.  Subsequent  ultrasound was performed also at [facility] on or about April 22, 2003.  The ultrasound revealed a 5.9 cm cyst in the left ovary.  There was  a right ovarian cyst measuring 2.7 cm in greatest diameter.  The uterus was  felt to be unremarkable.  The patient was evaluated through this office and  tumor markers were obtained including at CA125 which returned with a value  of 16.5.  There was also an AFP tumor marker obtained which returned with a  value of 5.0 which was also considered normal.  Subsequent ultrasound  performed in my office May 14, 2003 revealed the continued presence of a  left adnexal cyst of approximately 5.6 cm in greatest diameter, as well as a  2.2 cm right adnexal cyst.  At this time the patient was encouraged to under  diagnostic/operative laparoscopy with removal of these cysts.  She needed  some time to process the information and returned on or about June 18, 2003  stating that she would agree to having the aforementioned procedure.  Hence,  she is for diagnostic/operative laparoscopy with ovarian cystectomies and  indicated procedures.   MEDICATIONS:  Norvasc.   PAST  MEDICAL HISTORY:  1. Medical:  Hypertension.  2. Surgical:  Tonsillectomy in 1971, colon polypectomy in 2000.   ALLERGIES:  None.   FAMILY HISTORY:  Positive for uterine and breast cancer as well as diabetes.   SOCIAL HISTORY:  The patient smokes approximately three-quarter of a pack of  cigarettes per day.  She also drinks approximately 12 alcoholic beverages  per week.   REVIEW OF SYSTEMS:  Noncontributory.   PHYSICAL EXAMINATION:  GENERAL:  Well-developed, well-nourished, pleasant  white female in no acute distress.  VITAL SIGNS:  Afebrile with vital signs stable.  SKIN:  Warm and dry without lesions.  LYMPHATIC:  There is no supraclavicular, cervical, or inguinal adenopathy.  HEENT:  Normocephalic.  NECK:  Supple without thyromegaly.  CHEST:  Lungs are clear.  CARDIAC:  Regular  rate and rhythm without murmurs, gallops, or rubs.  BREAST:  Exam is deferred.  ABDOMEN:  Soft, nontender, without masses or organomegaly.  Bowel sounds are  active.  MUSCULOSKELETAL:  Reveals full range of motion without edema, cyanosis, or  CVA tenderness.  PELVIC:  (January 13) External genitalia within normal limits.  The vagina  and cervix were without gross lesions.  Bimanual examination revealed a  fullness in the left adnexa.  I could appreciate no obvious mass.  I could  appreciate no fullness or mass in the right adnexa.  Rectovaginal  examination confirmed.   IMPRESSION:  1. Bilateral ovarian cysts on recent CT scan, ultrasound at [facility] and subsequently confirmed in my office May 14, 2003.  2. Climacteric.  3. Smoker.  4. Hypertension.   PLAN:  Diagnostic/operative laparoscopy with bilateral ovarian cystectomies.  She understands the possibility of unilateral or bilateral salpingo-  oophorectomy and agrees to same.  She also understands the possibility of  exploratory laparotomy and agrees to same.  She understands she may at some  point be a candidate for  hormonal replacement therapy and agrees to same.  Questions invited and answered.                                               James A. Ashley Royalty, M.D.    JAM/MEDQ  D:  07/02/2003  T:  07/02/2003  Job:  [id number]

## 2011-01-16 ENCOUNTER — Telehealth: Payer: Self-pay

## 2011-01-16 NOTE — Telephone Encounter (Signed)
Pt reminded to have labs  

## 2011-01-16 NOTE — Telephone Encounter (Signed)
Message copied by Donata Duff on Mon Jan 16, 2011 10:47 AM ------      Message from: Donata Duff      Created: Wed Jul 20, 2010 10:22 AM       Pt to get labs

## 2011-07-17 DIAGNOSIS — I82409 Acute embolism and thrombosis of unspecified deep veins of unspecified lower extremity: Secondary | ICD-10-CM

## 2011-07-17 HISTORY — DX: Acute embolism and thrombosis of unspecified deep veins of unspecified lower extremity: I82.409

## 2012-05-08 ENCOUNTER — Observation Stay (HOSPITAL_COMMUNITY)
Admission: EM | Admit: 2012-05-08 | Discharge: 2012-05-09 | Disposition: A | Discharge status: Home / Self Care | Payer: BC Managed Care – PPO | Attending: Internal Medicine | Admitting: Internal Medicine

## 2012-05-08 ENCOUNTER — Encounter (HOSPITAL_COMMUNITY): Payer: Self-pay | Admitting: Emergency Medicine

## 2012-05-08 ENCOUNTER — Observation Stay (HOSPITAL_COMMUNITY): Payer: BC Managed Care – PPO

## 2012-05-08 DIAGNOSIS — E785 Hyperlipidemia, unspecified: Secondary | ICD-10-CM | POA: Diagnosis present

## 2012-05-08 DIAGNOSIS — D649 Anemia, unspecified: Secondary | ICD-10-CM

## 2012-05-08 DIAGNOSIS — Z23 Encounter for immunization: Secondary | ICD-10-CM | POA: Insufficient documentation

## 2012-05-08 DIAGNOSIS — Z720 Tobacco use: Secondary | ICD-10-CM | POA: Diagnosis present

## 2012-05-08 DIAGNOSIS — I959 Hypotension, unspecified: Secondary | ICD-10-CM

## 2012-05-08 DIAGNOSIS — Z86718 Personal history of other venous thrombosis and embolism: Secondary | ICD-10-CM | POA: Insufficient documentation

## 2012-05-08 DIAGNOSIS — D72829 Elevated white blood cell count, unspecified: Secondary | ICD-10-CM

## 2012-05-08 DIAGNOSIS — D509 Iron deficiency anemia, unspecified: Principal | ICD-10-CM | POA: Diagnosis present

## 2012-05-08 DIAGNOSIS — I071 Rheumatic tricuspid insufficiency: Secondary | ICD-10-CM | POA: Insufficient documentation

## 2012-05-08 DIAGNOSIS — F17200 Nicotine dependence, unspecified, uncomplicated: Secondary | ICD-10-CM | POA: Insufficient documentation

## 2012-05-08 DIAGNOSIS — F329 Major depressive disorder, single episode, unspecified: Secondary | ICD-10-CM | POA: Diagnosis present

## 2012-05-08 DIAGNOSIS — Z79899 Other long term (current) drug therapy: Secondary | ICD-10-CM | POA: Insufficient documentation

## 2012-05-08 DIAGNOSIS — E876 Hypokalemia: Secondary | ICD-10-CM | POA: Insufficient documentation

## 2012-05-08 DIAGNOSIS — F3289 Other specified depressive episodes: Secondary | ICD-10-CM | POA: Insufficient documentation

## 2012-05-08 DIAGNOSIS — I10 Essential (primary) hypertension: Secondary | ICD-10-CM | POA: Insufficient documentation

## 2012-05-08 HISTORY — DX: Major depressive disorder, single episode, unspecified: F32.9

## 2012-05-08 HISTORY — DX: Hyperlipidemia, unspecified: E78.5

## 2012-05-08 HISTORY — DX: Iron deficiency anemia, unspecified: D50.9

## 2012-05-08 HISTORY — DX: Hypokalemia: E87.6

## 2012-05-08 HISTORY — DX: Depression, unspecified: F32.A

## 2012-05-08 HISTORY — DX: Hypotension, unspecified: I95.9

## 2012-05-08 HISTORY — DX: Wheezing: R06.2

## 2012-05-08 HISTORY — DX: Acute embolism and thrombosis of unspecified deep veins of unspecified lower extremity: I82.409

## 2012-05-08 HISTORY — DX: Essential (primary) hypertension: I10

## 2012-05-08 HISTORY — DX: Elevated white blood cell count, unspecified: D72.829

## 2012-05-08 HISTORY — DX: Tobacco use: Z72.0

## 2012-05-08 LAB — URINALYSIS, ROUTINE W REFLEX MICROSCOPIC
Bilirubin Urine: NEGATIVE
Hgb urine dipstick: NEGATIVE
Ketones, ur: NEGATIVE mg/dL
Nitrite: NEGATIVE
pH: 6 (ref 5.0–8.0)

## 2012-05-08 LAB — IRON AND TIBC
Iron: 13 ug/dL — ABNORMAL LOW (ref 42–135)
Saturation Ratios: 3 % — ABNORMAL LOW (ref 20–55)

## 2012-05-08 LAB — CBC WITH DIFFERENTIAL/PLATELET
Basophils Absolute: 0.2 10*3/uL — ABNORMAL HIGH (ref 0.0–0.1)
Lymphs Abs: 2.6 10*3/uL (ref 0.7–4.0)
MCV: 72.5 fL — ABNORMAL LOW (ref 78.0–100.0)
Monocytes Relative: 7 % (ref 3–12)
Platelets: 234 10*3/uL (ref 150–400)
RDW: 21.5 % — ABNORMAL HIGH (ref 11.5–15.5)
WBC: 15.2 10*3/uL — ABNORMAL HIGH (ref 4.0–10.5)

## 2012-05-08 LAB — HEPATIC FUNCTION PANEL
Alkaline Phosphatase: 110 U/L (ref 39–117)
Total Bilirubin: 0.3 mg/dL (ref 0.3–1.2)

## 2012-05-08 LAB — BASIC METABOLIC PANEL
BUN: 13 mg/dL (ref 6–23)
Creatinine, Ser: 0.65 mg/dL (ref 0.50–1.10)
GFR calc Af Amer: >90 mL/min (ref >90)
GFR calc non Af Amer: >90 mL/min (ref >90)
Potassium: 3.2 mEq/L — ABNORMAL LOW (ref 3.5–5.1)

## 2012-05-08 LAB — VITAMIN B12: Vitamin B-12: 754 pg/mL (ref 211–911)

## 2012-05-08 LAB — APTT: aPTT: 26 seconds (ref 24–37)

## 2012-05-08 LAB — FOLATE: Folate: 13.5 ng/mL

## 2012-05-08 LAB — RETICULOCYTES
RBC.: 3.24 MIL/uL — ABNORMAL LOW (ref 3.87–5.11)
Retic Count, Absolute: 152.3 10*3/uL (ref 19.0–186.0)

## 2012-05-08 LAB — OCCULT BLOOD, POC DEVICE: Fecal Occult Bld: NEGATIVE

## 2012-05-08 LAB — PREPARE RBC (CROSSMATCH)

## 2012-05-08 MED ORDER — ONDANSETRON HCL 4 MG PO TABS: 4.0000 mg | ORAL_TABLET | Freq: Four times a day (QID) | ORAL | Status: DC | PRN

## 2012-05-08 MED ORDER — SODIUM CHLORIDE 0.9 % IV SOLN
Freq: Once | INTRAVENOUS | Status: AC
  Administered 2012-05-08: 14:00:00 via INTRAVENOUS

## 2012-05-08 MED ORDER — SIMVASTATIN 20 MG PO TABS
20.0000 mg | ORAL_TABLET | Freq: Every day | ORAL | Status: DC
  Administered 2012-05-09: 20 mg via ORAL
  Filled 2012-05-08: qty 1

## 2012-05-08 MED ORDER — ACETAMINOPHEN 650 MG RE SUPP: 650.0000 mg | Freq: Four times a day (QID) | RECTAL | Status: DC | PRN

## 2012-05-08 MED ORDER — NICOTINE 14 MG/24HR TD PT24
14.0000 mg | MEDICATED_PATCH | Freq: Every day | TRANSDERMAL | Status: DC
  Administered 2012-05-08 – 2012-05-09 (×2): 14 mg via TRANSDERMAL
  Filled 2012-05-08 (×2): qty 1

## 2012-05-08 MED ORDER — ALBUTEROL SULFATE HFA 108 (90 BASE) MCG/ACT IN AERS
2.0000 | INHALATION_SPRAY | Freq: Four times a day (QID) | RESPIRATORY_TRACT | Status: DC | PRN
  Filled 2012-05-08: qty 6.7

## 2012-05-08 MED ORDER — FERROUS SULFATE 325 (65 FE) MG PO TABS
325.0000 mg | ORAL_TABLET | Freq: Two times a day (BID) | ORAL | Status: DC
  Administered 2012-05-09: 325 mg via ORAL
  Filled 2012-05-08 (×4): qty 1

## 2012-05-08 MED ORDER — ONDANSETRON HCL 4 MG/2ML IJ SOLN: 4.0000 mg | Freq: Four times a day (QID) | INTRAMUSCULAR | Status: DC | PRN

## 2012-05-08 MED ORDER — CITALOPRAM HYDROBROMIDE 10 MG PO TABS
10.0000 mg | ORAL_TABLET | Freq: Every day | ORAL | Status: DC
  Administered 2012-05-09: 10 mg via ORAL
  Filled 2012-05-08: qty 1

## 2012-05-08 MED ORDER — PNEUMOCOCCAL VAC POLYVALENT 25 MCG/0.5ML IJ INJ
0.5000 mL | INJECTION | INTRAMUSCULAR | Status: AC
  Administered 2012-05-09: 0.5 mL via INTRAMUSCULAR
  Filled 2012-05-08 (×2): qty 0.5

## 2012-05-08 MED ORDER — ALUM & MAG HYDROXIDE-SIMETH 200-200-20 MG/5ML PO SUSP: 30.0000 mL | Freq: Four times a day (QID) | ORAL | Status: DC | PRN

## 2012-05-08 MED ORDER — POTASSIUM CHLORIDE IN NACL 40-0.9 MEQ/L-% IV SOLN
INTRAVENOUS | Status: DC
  Administered 2012-05-08 (×2): via INTRAVENOUS
  Filled 2012-05-08 (×3): qty 1000

## 2012-05-08 MED ORDER — ACETAMINOPHEN 325 MG PO TABS: 650.0000 mg | ORAL_TABLET | Freq: Four times a day (QID) | ORAL | Status: DC | PRN

## 2012-05-08 NOTE — ED Provider Notes (Signed)
Medical screening examination/treatment/procedure(s) were conducted as a shared visit with non-physician practitioner(s) and myself.  I personally evaluated the patient during the encounter  Symptomatic anemia.  We'll transfuse now.  Anemia panel ordered.  Patient be admitted to the hospital for additional workup and blood transfusion  Lyanne Co, MD 05/08/12 1555

## 2012-05-08 NOTE — H&P (Addendum)
[facility] History and Physical  GORDON CARLSON ZOX:[id number] DOB: 1951-01-04 DOA: 05/08/2012  Referring physician: Dr. Azalia Bilis PCP: [name], MD  Gastroenterologist: Dr. Rob Bunting  Chief Complaint: Weakness   History of Present Illness: [name] is an 62 y.o. female with a PMH of iron deficiency anemia, but no obvious cause by colonoscopy/EGD upon work up 03/2010, has been on iron supplements in the past but stopped these 6-9 months ago, re-started 1 week ago, who saw her PCP this week with complaints of dizziness and weakness.  A CBC was done, and she was found to be anemic, so she was instructed to come to the ER for further evaluation.  Hemoglobin here is 5.8 mg/dL.  No complaints of black or bloody stools, hematuria or blood loss from other obvious sources.  She is symptomatic, however, with shortness of breath upon exertion and weakness as well as dizziness and cold intolerance. Her symptoms have been gradually getting worse over the past 2 months. No aggravating or alleviating factors.  Review of Systems: Constitutional: No fever, no chills;  Appetite normal; No weight loss, no weight gain.  HEENT: No blurry vision, no diplopia, no pharyngitis, no dysphagia CV: No chest pain, no palpitations.  Resp: + SOB with exertion x 1-2 months, no cough. GI: No nausea, no vomiting, no diarrhea, no melena, no hematochezia.  GU: No dysuria, no hematuria.  MSK: no myalgias, no arthralgias.  Neuro:  No headache, no focal neurological deficits, no history of seizures.  Psych: No depression, no anxiety.  Endo: No thyroid disease, no DM, no heat intolerance, + cold intolerance, no polyuria, no polydipsia  Skin: No rashes, no skin lesions.  Heme: No easy bruising, + h/o anemia and DVT.  Past Medical History Past Medical History  Diagnosis Date  . Iron deficiency anemia   . DVT (deep venous thrombosis) 07/2011    S/P coumadin x 6 months  . Depression   . Hyperlipidemia     . HTN (hypertension)   . Wheezing     Recently put on albuterol, no history of asthma     Past Surgical History Past Surgical History  Procedure Date  . Appendectomy   . Tonsillectomy   . Bilateral oophrectomy     Secondary to benign cysts     Social History: History   Social History  . Marital Status: Married    Spouse Name: Gurpreet Mariani    Number of Children: 2  . Years of Education: N/A   Occupational History  . Buyer for a company    Social History Main Topics  . Smoking status: Current Every Day Smoker -- 0.5 packs/day for 40 years    Types: Cigarettes  . Smokeless tobacco: Never Used  . Alcohol Use: 9.0 oz/week    15 Cans of beer per week  . Drug Use: No  . Sexually Active: No   Other Topics Concern  . Not on file   Social History Narrative   Married.  Lives in husband here in [place].  Normally independent of ADLs.    Family History:  Family History  Problem Relation Age of Onset  . Uterine cancer Mother   . Alcoholism Father   . Breast cancer Mother   . Liver disease Father     Allergies: Review of patient's allergies indicates no known allergies.  Meds: Prior to Admission medications   Medication Sig Start Date End Date Taking? Authorizing Provider  albuterol (PROVENTIL HFA;VENTOLIN HFA) 108 (90 BASE)  MCG/ACT inhaler Inhale 2 puffs into the lungs every 6 (six) hours as needed. For shortness of breath   Yes Historical Provider, MD  amLODipine (NORVASC) 10 MG tablet Take 10 mg by mouth daily.   Yes Historical Provider, MD  atenolol (TENORMIN) 50 MG tablet Take 25 mg by mouth daily.   Yes Historical Provider, MD  citalopram (CELEXA) 20 MG tablet Take 10 mg by mouth daily.   Yes Historical Provider, MD  ferrous sulfate 325 (65 FE) MG tablet Take 325 mg by mouth daily with breakfast.   Yes Historical Provider, MD  simvastatin (ZOCOR) 20 MG tablet Take 20 mg by mouth daily.   Yes Historical Provider, MD    Physical Exam: Filed Vitals:    05/08/12 1257 05/08/12 1314 05/08/12 1606 05/08/12 1635  BP: 70/56 131/59 127/65 131/60  Pulse: 72 76 73 71  Temp: 98.8 F (37.1 C)  98.1 F (36.7 C) 98.1 F (36.7 C)  TempSrc: Oral  Oral Oral  Resp: 18 16 18 19   Height:    5\' 4"  (1.626 m)  Weight:    80.74 kg (178 lb)  SpO2: 100% 100% 98% 99%     Physical Exam: Blood pressure 131/60, pulse 71, temperature 98.1 F (36.7 C), temperature source Oral, resp. rate 19, height 5\' 4"  (1.626 m), weight 80.74 kg (178 lb), SpO2 99.00%. Gen: No acute distress. Head: Normocephalic, atraumatic. Eyes: PERRL, EOMI, sclerae nonicteric. Mouth: Oropharynx clear with dry mucous membranes. Neck: Supple, no thyromegaly, no lymphadenopathy, no jugular venous distention. Chest: Lungs clear to auscultation bilaterally. CV: Heart sounds regular rate, and rhythm. No murmurs, rubs, or gallops. Abdomen: Soft, nontender, nondistended with normal active bowel sounds. Rectal: Done by emergency department physician. Stool Hemoccult negative. Extremities: Extremities without clubbing, edema, or cyanosis. Skin: Warm and dry. No hematomas or ecchymosis observed. Neuro: Alert and oriented times 3; cranial nerves II through XII grossly intact. Psych: Mood and affect normal.  Labs on Admission:  Basic Metabolic Panel:  Lab 05/08/12 1610  NA 141  K 3.2*  CL 108  CO2 23  GLUCOSE 134*  BUN 13  CREATININE 0.65  CALCIUM 8.5  MG --  PHOS --   CBC:  Lab 05/08/12 1326  WBC 15.2*  NEUTROABS 11.1*  HGB 5.8*  HCT 23.5*  MCV 72.5*  PLT 234    Radiological Exams on Admission: No results found.  EKG: Independently reviewed. Normal sinus rhythm at 75 beats per minute. No ST or T wave abnormalities.  Assessment/Plan Principal Problem:  *Iron deficiency anemia, unspecified, symptomatic with dyspnea, dizziness, and shortness of breath with activity  Recurrent problem, status post GI evaluation 03/2010. With Hemoccult-negative stools, doubt this represents  GI bleeding.  Check haptoglobin, LDH, and bilirubin to rule out hemolysis.  Followup anemia panel.  Transfuse 2 units of blood.  May need a bone marrow biopsy if hemolysis workup negative. This can be scheduled as an outpatient. Active Problems:  Hypotension  History of hypertension, hold antihypertensives for now.  Hypokalemia  Replace in IV fluids.  Hyperlipidemia  Continue Zocor.  Depression  Continue Celexa.  Tobacco abuse  Counseled. Tobacco cessation information per nursing staff. Nicotine patch ordered.  Leukocytosis  Check chest x-ray given history of smoking and recent onset of wheezing.  Check urinalysis.  Code Status: Full. Family Communication: Jalah Warmuth (husband) [phone]. Disposition Plan: Home when stable.  Time spent: 1 hour.  Rodrickus Min [facility] Pager [phone]  If 7PM-7AM, please contact night-coverage [email or web address] Password [facility] 05/08/2012, 5:04 PM

## 2012-05-08 NOTE — ED Provider Notes (Signed)
History     CSN: [id number]  Arrival date & time 05/08/12  1202   First MD Initiated Contact with Patient 05/08/12 1303      Chief Complaint  Patient presents with  . Anemia  . Weakness    (Consider location/radiation/quality/duration/timing/severity/associated sxs/prior treatment) Patient is a 62 y.o. female presenting with anemia and weakness. The history is provided by the patient.  Anemia This is a recurrent problem. The current episode started 1 to 4 weeks ago. Associated symptoms include weakness. Pertinent negatives include no abdominal pain, chest pain, fever, myalgias or vomiting. Associated symptoms comments: She was seen at her PCP office Monday of this week with complaint of tired and weak without syncope. She was rechecked yesterday and blood was drawn. She was contacted today by her doctor informing her that her hemoglobin was 5.6 and to come to the hospital for further evaluation. She reports a history of anemia 2 years ago, found after an episode of syncope. She states at that time her EGD/colonoscopy was negative. She denies hematochezia or melena now. No pain, fever, N, V. .  Weakness Primary symptoms do not include fever or vomiting.  Additional symptoms include weakness.    Past Medical History  Diagnosis Date  . Anemia     Past Surgical History  Procedure Date  . Appendectomy   . Tonsillectomy     No family history on file.  History  Substance Use Topics  . Smoking status: Current Every Day Smoker    Types: Cigarettes  . Smokeless tobacco: Not on file  . Alcohol Use:     OB History    Grav Para Term Preterm Abortions TAB SAB Ect Mult Living                  Review of Systems  Constitutional: Negative for fever.  Eyes: Negative for visual disturbance.  Respiratory: Positive for shortness of breath.   Cardiovascular: Negative for chest pain and palpitations.  Gastrointestinal: Negative for vomiting, abdominal pain, diarrhea and blood in  stool.  Genitourinary: Negative for dysuria.  Musculoskeletal: Negative for myalgias.  Skin: Negative for pallor.  Neurological: Positive for weakness and light-headedness. Negative for syncope.  Hematological: Does not bruise/bleed easily.  Psychiatric/Behavioral: Negative for confusion.    Allergies  Review of patient's allergies indicates not on file.  Home Medications  No current outpatient prescriptions on file.  BP 131/59  Pulse 76  Temp 98.8 F (37.1 C) (Oral)  Resp 16  SpO2 100%  Physical Exam  Constitutional: She is oriented to person, place, and time. She appears well-developed and well-nourished.  HENT:  Head: Normocephalic.  Eyes:       Mild conjunctival pallor.   Neck: Normal range of motion. Neck supple.  Cardiovascular: Normal rate and regular rhythm.   No murmur heard. Pulmonary/Chest: Effort normal and breath sounds normal. She has no wheezes. She has no rales. She exhibits no tenderness.  Abdominal: Soft. Bowel sounds are normal. There is no tenderness. There is no rebound and no guarding.  Musculoskeletal: Normal range of motion. She exhibits no edema.  Neurological: She is alert and oriented to person, place, and time.  Skin: Skin is warm and dry. No rash noted. There is pallor.  Psychiatric: She has a normal mood and affect.    ED Course  Procedures (including critical care time)   Labs Reviewed  CBC WITH DIFFERENTIAL  BASIC METABOLIC PANEL  TYPE AND SCREEN  PROTIME-INR  APTT  VITAMIN B12  FOLATE  IRON AND TIBC  FERRITIN  RETICULOCYTES   Results for orders placed during the hospital encounter of 05/08/12  CBC WITH DIFFERENTIAL      Component Value Range   WBC 15.2 (*) 4.0 - 10.5 K/uL   RBC 3.24 (*) 3.87 - 5.11 MIL/uL   Hemoglobin 5.8 (*) 12.0 - 15.0 g/dL   HCT 96.0 (*) 45.4 - 09.8 %   MCV 72.5 (*) 78.0 - 100.0 fL   MCH 17.9 (*) 26.0 - 34.0 pg   MCHC 24.7 (*) 30.0 - 36.0 g/dL   RDW 11.9 (*) 14.7 - 82.9 %   Platelets 234  150 - 400  K/uL   Neutrophils Relative 74  43 - 77 %   Lymphocytes Relative 17  12 - 46 %   Monocytes Relative 7  3 - 12 %   Eosinophils Relative 1  0 - 5 %   Basophils Relative 1  0 - 1 %   Neutro Abs 11.1 (*) 1.7 - 7.7 K/uL   Lymphs Abs 2.6  0.7 - 4.0 K/uL   Monocytes Absolute 1.1 (*) 0.1 - 1.0 K/uL   Eosinophils Absolute 0.2  0.0 - 0.7 K/uL   Basophils Absolute 0.2 (*) 0.0 - 0.1 K/uL   RBC Morphology POLYCHROMASIA PRESENT     Smear Review LARGE PLATELETS PRESENT    BASIC METABOLIC PANEL      Component Value Range   Sodium 141  135 - 145 mEq/L   Potassium 3.2 (*) 3.5 - 5.1 mEq/L   Chloride 108  96 - 112 mEq/L   CO2 23  19 - 32 mEq/L   Glucose, Bld 134 (*) 70 - 99 mg/dL   BUN 13  6 - 23 mg/dL   Creatinine, Ser 5.62  0.50 - 1.10 mg/dL   Calcium 8.5  8.4 - 13.0 mg/dL   GFR calc non Af Amer >90  >90 mL/min   GFR calc Af Amer >90  >90 mL/min  TYPE AND SCREEN      Component Value Range   ABO/RH(D) O POS     Antibody Screen NEG     Sample Expiration 05/11/2012     Unit Number [id number]     Blood Component Type RBC LR PHER1     Unit division 00     Status of Unit ALLOCATED     Transfusion Status OK TO TRANSFUSE     Crossmatch Result Compatible     Unit Number [id number]     Blood Component Type RED CELLS,LR     Unit division 00     Status of Unit ALLOCATED     Transfusion Status OK TO TRANSFUSE     Crossmatch Result Compatible    PROTIME-INR      Component Value Range   Prothrombin Time 13.1  11.6 - 15.2 seconds   INR 1.00  0.00 - 1.49  APTT      Component Value Range   aPTT 26  24 - 37 seconds  RETICULOCYTES      Component Value Range   Retic Ct Pct 4.7 (*) 0.4 - 3.1 %   RBC. 3.24 (*) 3.87 - 5.11 MIL/uL   Retic Count, Manual 152.3  19.0 - 186.0 K/uL  ABO/RH      Component Value Range   ABO/RH(D) O POS    PREPARE RBC (CROSSMATCH)      Component Value Range   Order Confirmation ORDER PROCESSED BY BLOOD BANK      No results found.  No diagnosis found.  1.  Symptomatic anemia   MDM  Recheck of BP 131. Patient denies lightheadedness or pain at present. Appears stable. Labs ordered. Lab studies patient reports done by her MD yesterday unavailable for review in EPIC.   3:00 - Hbg less than 6. Guaiac negative and patient is stable. Ambulatory without lightheadedness. Still feels generally weak and fatigued. Transfusion ordered, patient updated. Discussed with Dr. Molinda Bailiff office and records to be faxed.   Admit.        Arnoldo Hooker, PA-C 05/08/12 1520

## 2012-05-08 NOTE — ED Notes (Signed)
Pt states that she has been feeling weak and tired since before christmas.  Went to her doctor.  Was told that her hemoglobin was 5.6.  Pt's BP is 70/56.

## 2012-05-08 NOTE — ED Notes (Signed)
Pt c/o of HGB 5.6 yesterday per primary physician. Also c/o of weakness, fatigue, and dizziness. NAD at this time.

## 2012-05-09 LAB — TYPE AND SCREEN

## 2012-05-09 LAB — CBC
HCT: 27.8 % — ABNORMAL LOW (ref 36.0–46.0)
MCH: 20.9 pg — ABNORMAL LOW (ref 26.0–34.0)
MCV: 75.3 fL — ABNORMAL LOW (ref 78.0–100.0)
Platelets: 216 10*3/uL (ref 150–400)
RDW: 20.9 % — ABNORMAL HIGH (ref 11.5–15.5)

## 2012-05-09 LAB — BASIC METABOLIC PANEL
CO2: 24 mEq/L (ref 19–32)
Calcium: 7.8 mg/dL — ABNORMAL LOW (ref 8.4–10.5)
Creatinine, Ser: 0.62 mg/dL (ref 0.50–1.10)
Glucose, Bld: 132 mg/dL — ABNORMAL HIGH (ref 70–99)

## 2012-05-09 MED ORDER — POLYSACCHARIDE IRON COMPLEX 150 MG PO CAPS: 150.0000 mg | ORAL_CAPSULE | Freq: Two times a day (BID) | ORAL | Status: DC

## 2012-05-09 NOTE — Progress Notes (Signed)
Pt assessment unchanged from this morning; pt medically stable for DC per MD order.  Reviewed DC instructions and medications.  Provide Smoking Cessation and Smoking Cessation, Tips for Success, Celiac Disease handouts.  Pt taken to ED entrance in wheelchair by [employer] student Hughes Better; pt driving herself home; MD aware/

## 2012-05-09 NOTE — Progress Notes (Signed)
UR complete  Correna Meacham,RN,BSN [phone] 

## 2012-05-09 NOTE — Discharge Summary (Signed)
Physician Discharge Summary  [name] NWG:[id number] DOB: 03/16/1951 DOA: 05/08/2012  PCP: [name], MD  Admit date: 05/08/2012 Discharge date: 05/09/2012  Recommendations for Outpatient Follow-up:  1. Recommend weekly CBC checks until hemoglobin stable. 2. Recommend repeat blood pressure check in one week. Note: The patient's metoprolol has been placed on hold. She is being discharged on Norvasc. 3. Recommend outpatient referral back to Dr. Christella Hartigan of gastroenterology for evaluation of malabsorption syndromes such as celiac disease.  Discharge Diagnoses:  Principal Problem:  *Iron deficiency anemia, unspecified, symptomatic with dyspnea, dizziness, and shortness of breath with activity Active Problems:   Hypotension   Hypokalemia   Hyperlipidemia   Depression   Tobacco abuse   Leukocytosis   Discharge Condition: Improved.  Diet recommendation: Low-sodium, heart healthy.  History of present illness:  [name] is an 62 y.o. female with a PMH of iron deficiency anemia, but no obvious cause by colonoscopy/EGD upon work up 03/2010, has been on iron supplements in the past but stopped these 6-9 months ago, re-started 1 week ago, who saw her PCP this week with complaints of dizziness and weakness. A CBC was done, and she was found to be anemic, so she was instructed to come to the ER for further evaluation. Hemoglobin here was 5.8 mg/dL. No complaints of black or bloody stools, hematuria or blood loss from other obvious sources. She was symptomatic, however, with shortness of breath upon exertion and weakness as well as dizziness and cold intolerance. Her symptoms have been gradually getting worse over the past 2 months. No aggravating or alleviating factors. She received 2 units of packed red blood cells while in the hospital with an appropriate rise in her hemoglobin. Diagnostic testing confirmed iron deficiency with no evidence of hemolysis or GI bleeding.  We  have recommended that she followup with her primary care physician for further evaluation of possible malabsorption syndromes such as celiac disease.  Hospital Course by problem:  Principal Problem:  *Iron deficiency anemia, unspecified, symptomatic with dyspnea, dizziness, and shortness of breath with activity   Recurrent problem, status post GI evaluation 03/2010. Stool was negative for microscopic bleeding.   Haptoglobin, LDH, and bilirubin were checked and not indicative of hemolysis.   Anemia panel showed normal B12 and serum folate acid levels. She was extremely iron deficient with low iron and ferritin.   Transfused 2 units of packed red blood cells with an appropriate rise in her hemoglobin.  Given normal other cell lines, bone marrow biopsy not currently indicated, but would recommend followup with GI for duodenal biopsy to rule out celiac disease. Active Problems:  Hypotension   History of hypertension. Norvasc and metoprolol held. Blood pressure improved and became hypertensive. Resume Norvasc at discharge but would continue to hold metoprolol until she sees her primary care physician in followup. Hypokalemia   Replaced. Hyperlipidemia   Continue Zocor. Depression   Continue Celexa. Tobacco abuse   Counseled. Tobacco cessation information per nursing staff. Nicotine patch provided. Leukocytosis   No evidence of pneumonia on chest radiography. No evidence of UTI.  White blood cell count normalized on recheck.  Procedures:  None.  Consultations:  None.  Discharge Exam: Filed Vitals:   05/09/12 0445  BP: 136/59  Pulse: 86  Temp: 98.4 F (36.9 C)  Resp: 18   Filed Vitals:   05/08/12 2130 05/08/12 2230 05/08/12 2257 05/09/12 0445  BP: 115/54 117/52 121/51 136/59  Pulse: 73 73 75 86  Temp: 98.3 F (36.8 C) 98.5  F (36.9 C) 98.3 F (36.8 C) 98.4 F (36.9 C)  TempSrc: Oral Oral Oral Oral  Resp: 16 16 16 18   Height:      Weight:      SpO2:   100%  98%    Gen:  NAD Cardiovascular:  RRR, No M/R/G Respiratory: Lungs CTAB Gastrointestinal: Abdomen soft, NT/ND with normal active bowel sounds. Extremities: No C/E/C   Discharge Instructions  Discharge Orders    Future Orders Please Complete By Expires   Diet - low sodium heart healthy      Increase activity slowly      Discharge instructions      Comments:   Follow up with your PCP in 1 week.  Have him re-check your blood counts and your blood pressure.  Hold metoprolol until you see him in follow up for a blood pressure check.    You may return to work 05/14/12.   Call MD for:  persistant dizziness or light-headedness      Call MD for:  extreme fatigue          Medication List     As of 05/09/2012  8:31 AM    STOP taking these medications         albuterol 108 (90 BASE) MCG/ACT inhaler   Commonly known as: PROVENTIL HFA;VENTOLIN HFA      atenolol 50 MG tablet   Commonly known as: TENORMIN      ferrous sulfate 325 (65 FE) MG tablet      TAKE these medications         amLODipine 10 MG tablet   Commonly known as: NORVASC   Take 10 mg by mouth daily.      citalopram 20 MG tablet   Commonly known as: CELEXA   Take 10 mg by mouth daily.      iron polysaccharides 150 MG capsule   Commonly known as: NIFEREX   Take 1 capsule (150 mg total) by mouth 2 (two) times daily.      simvastatin 20 MG tablet   Commonly known as: ZOCOR   Take 20 mg by mouth daily.           Follow-up Information    Follow up with SLATOSKY,JOHN J., MD. Schedule an appointment as soon as possible for a visit in 1 week.      Follow up with Rob Bunting, MD. (Evaluate you for possible celiac disease / malabsorption)    Contact information:   [address] [place] [address] [phone]           The results of significant diagnostics from this hospitalization (including imaging, microbiology, ancillary and laboratory) are listed below for reference.     Significant Diagnostic Studies: Portable Chest 1 View  05/08/2012  *RADIOLOGY REPORT*  Clinical Data: History smoking, now with wheezing and leukocytosis  PORTABLE CHEST - 1 VIEW  Comparison: 07/17/2006; CT abdomen pelvis - 07/17/2006  Findings:  Grossly unchanged borderline enlarged cardiac silhouette and mediastinal contours with atherosclerotic calcifications within the thoracic aorta.  Apparent retrocardiac heterogeneous / consolidative opacity is favored to represent the hiatal hernia demonstrated a remote abdominal CT.  There is grossly unchanged mild diffuse thickening of the pulmonary interstitium.  Unchanged left basilar heterogeneous opacity.  No new focal airspace opacity. No definite pleural effusion or pneumothorax.  Unchanged bones.  IMPRESSION:  1.  Bronchitic change without acute cardiopulmonary disease. Further evaluation with PA and lateral chest radiograph performed as clinically  indicated. 2.  Retrocardiac opacity favored to represent a hiatal hernia seen on remote abdominal CT.   Original Report Authenticated By: Tacey Ruiz, MD     Microbiology: No results found for this or any previous visit (from the past 240 hour(s)).   Labs: Basic Metabolic Panel:  Lab 05/09/12 1610 05/08/12 1326  NA 139 141  K 4.0 3.2*  CL 109 108  CO2 24 23  GLUCOSE 132* 134*  BUN 9 13  CREATININE 0.62 0.65  CALCIUM 7.8* 8.5  MG -- --  PHOS -- --   Liver Function Tests:  Lab 05/08/12 1355  AST 17  ALT 9  ALKPHOS 110  BILITOT 0.3  PROT 6.3  ALBUMIN 3.4*   Hemolysis Evaluation:  Ref. Range 05/08/2012 13:55  Haptoglobin Latest Range: 45-215 mg/dL 960 (H)    Ref. Range 05/08/2012 13:55  LDH Latest Range: 94-250 U/L 248   CBC:  Lab 05/09/12 0420 05/08/12 1326  WBC 9.5 15.2*  NEUTROABS -- 11.1*  HGB 7.7* 5.8*  HCT 27.8* 23.5*  MCV 75.3* 72.5*  PLT 216 234   Thyroid Tests:  Ref. Range 05/08/2012 13:55  TSH Latest Range: 0.350-4.500 uIU/mL 1.644   Anemia Studies:  Ref.  Range 05/08/2012 13:26  Iron Latest Range: 42-135 ug/dL 13 (L)  UIBC Latest Range: 125-400 ug/dL 454 (H)  TIBC Latest Range: 250-470 ug/dL 098  Saturation Ratios Latest Range: 20-55 % 3 (L)  Ferritin Latest Range: 10-291 ng/mL 6 (L)  Folate No range found 13.5    Ref. Range 05/08/2012 13:26  Vitamin B-12 Latest Range: 211-911 pg/mL 754   Time coordinating discharge: 35 minutes.  Signed:  RAMA,CHRISTINA  Pager [phone] [facility] 05/09/2012, 8:31 AM

## 2012-06-11 ENCOUNTER — Encounter: Payer: Self-pay | Admitting: Gastroenterology

## 2012-06-11 ENCOUNTER — Ambulatory Visit (INDEPENDENT_AMBULATORY_CARE_PROVIDER_SITE_OTHER): Payer: BC Managed Care – PPO | Admitting: Gastroenterology

## 2012-06-11 VITALS — BP 136/68 | HR 72 | Ht 64.0 in | Wt 176.0 lb

## 2012-06-11 DIAGNOSIS — D649 Anemia, unspecified: Secondary | ICD-10-CM

## 2012-06-11 NOTE — Patient Instructions (Addendum)
One of your biggest health concerns is your smoking.  This increases your risk for most cancers and serious cardiovascular diseases such as strokes, heart attacks.  You should try your best to stop.  If you need assistance, please contact your PCP or Smoking Cessation Class at [facility] [phone]) or [facility] Quit-Line (1-800-QUIT-NOW). You should take PPI (antiacid medicine) daily. Prilosec or prevacid or their generic equivalent.  Best taken 20-30 min prior to a meal.  You need this for your daily apirin needs as well as the previous erosions noted in your stomach. Stay on iron 1-2 pills per day. Repeat labs in two months (cbc, iron, ferritin, tibc level). You do not need to avoid Vit K containing anymore since you are off coumadin. Try tylenol for pains.

## 2012-06-11 NOTE — Progress Notes (Signed)
Review of gastrointestinal problems:  1. Adenomatous colon polyps, colonoscopy December 2011, 1 cm, next colonoscopy at 3 year interval  2. iron deficiency anemia  3. hiatal hernia with Cameron's erosions, EGD dec 2011, also biopsies of dudoenum wre normal;  Cameron's erosions possibly causing [name] anemia (microcytic, hb 7.8 at the time), anemia improved with iron supplements and proton pump inhibitor   HPI: This is a  pleasant 62 year old woman whom I last saw about 2 years ago.  [name] was recently seen and admitted at the hospital for significant iron deficiency anemia January 2014. Hemoccult testing was negative. [name] had been off [name] iron supplements for several months. [name] was recommended to come see me to consider EGD and biopsy for sprue however I have already  done that 2-3 years ago, see the above. [name] was transfused 2 units.  Hb was 5.8.  [name] was feeling pretty bad at the time, very SOB, fatigued.  [name] was Hemoccult negative  [name] had stopped taking iron a long time ago, was very rough on [name] stomach.  Was put back on iron, newer formulation and it agrees much better.  Was told to take 2 per day, usually remembers only one per day.  Dr. Egbert Garibaldi checked Hb 2 weeks ago, up to 10.  [name] was concerned about [name] "mental state."  [name] was tearful about what to eat.  [name] has gained weight recently.    Up about 6 pounds since 2012.  [name] is not on any PPI daily.  Takes asa 325mg  once daily for 2 weeks.   Past Medical History  Diagnosis Date  . Iron deficiency anemia   . DVT (deep venous thrombosis) 07/2011    S/P coumadin x 6 months  . Depression   . Hyperlipidemia   . HTN (hypertension)   . Wheezing     Recently put on albuterol, no history of asthma    Past Surgical History  Procedure Laterality Date  . Appendectomy    . Tonsillectomy    . Bilateral oophrectomy      Secondary to benign cysts    Current Outpatient Prescriptions  Medication Sig Dispense Refill  . amLODipine  (NORVASC) 10 MG tablet Take 10 mg by mouth daily.      Marland Kitchen aspirin 325 MG tablet Take 325 mg by mouth daily.      . citalopram (CELEXA) 20 MG tablet Take 10 mg by mouth daily.      . iron polysaccharides (NIFEREX) 150 MG capsule Take 1 capsule (150 mg total) by mouth 2 (two) times daily.  60 capsule  3  . simvastatin (ZOCOR) 20 MG tablet Take 20 mg by mouth daily.       No current facility-administered medications for this visit.    Allergies as of 06/11/2012  . (No Known Allergies)    Family History  Problem Relation Age of Onset  . Uterine cancer Mother   . Alcoholism Father   . Breast cancer Mother   . Liver disease Father     History   Social History  . Marital Status: Married    Spouse Name: Selda Jalbert    Number of Children: 2  . Years of Education: N/A   Occupational History  . Buyer for a company    Social History Main Topics  . Smoking status: Current Every Day Smoker -- 0.50 packs/day for 40 years    Types: Cigarettes  . Smokeless tobacco: Never Used  . Alcohol Use: 9.0 oz/week  15 Cans of beer per week  . Drug Use: No  . Sexually Active: No   Other Topics Concern  . Not on file   Social History Narrative   Married.  Lives in husband here in [place].  Normally independent of ADLs.      Physical Exam: BP 136/68  Pulse 72  Ht 5\' 4"  (1.626 m)  Wt 176 lb (79.833 kg)  BMI 30.2 kg/m2 Constitutional: generally well-appearing Psychiatric: alert and oriented x3 Abdomen: soft, nontender, nondistended, no obvious ascites, no peritoneal signs, normal bowel sounds     Assessment and plan: 62 y.o. female with iron deficiency anemia  [name] had a colonoscopy and upper endoscopy a little over 2 years ago finding Cameron's erosions in [name] stomach as well as a 1 cm polyp in [name] colon. [name] anemia, hemoglobin at time of 7.8, responded to PPI and iron supplements. [name] has been off of both iron supplements and PPI for the past many many months. [name] restart iron  supplements and is tolerating it much better. [name] takes aspirin daily for leg discomforts and I recommended [name] try Tylenol instead but either way [name] should be taking proton pump inhibitor daily as recommended previously. [name] will have a repeat set of labs including CBC, iron testing in about 2 months time. I see no reason to repeat upper or lower endoscopy since [name] had both 2 years ago.

## 2012-08-09 ENCOUNTER — Telehealth: Payer: Self-pay

## 2012-08-09 NOTE — Telephone Encounter (Signed)
Pt has been notified to have labs next week

## 2012-08-09 NOTE — Telephone Encounter (Signed)
Message copied by Donata Duff on Fri Aug 09, 2012  8:01 AM ------      Message from: Donata Duff      Created: Tue Jun 11, 2012 10:21 AM       Pt to get labs  ------

## 2012-08-20 ENCOUNTER — Other Ambulatory Visit (INDEPENDENT_AMBULATORY_CARE_PROVIDER_SITE_OTHER): Payer: BC Managed Care – PPO

## 2012-08-20 DIAGNOSIS — D649 Anemia, unspecified: Secondary | ICD-10-CM

## 2012-08-20 LAB — CBC WITH DIFFERENTIAL/PLATELET
Basophils Relative: 0.6 % (ref 0.0–3.0)
Eosinophils Absolute: 0.2 10*3/uL (ref 0.0–0.7)
HCT: 29.1 % — ABNORMAL LOW (ref 36.0–46.0)
Lymphs Abs: 1.8 10*3/uL (ref 0.7–4.0)
MCHC: 29.1 g/dL — ABNORMAL LOW (ref 30.0–36.0)
MCV: 64.7 fl — ABNORMAL LOW (ref 78.0–100.0)
Monocytes Absolute: 0.6 10*3/uL (ref 0.1–1.0)
Neutro Abs: 8.5 10*3/uL — ABNORMAL HIGH (ref 1.4–7.7)
Neutrophils Relative %: 76.5 % (ref 43.0–77.0)
RBC: 4.49 Mil/uL (ref 3.87–5.11)

## 2012-08-20 LAB — IBC PANEL
Iron: 8 ug/dL — ABNORMAL LOW (ref 42–145)
Saturation Ratios: 1.8 % — ABNORMAL LOW (ref 20.0–50.0)
Transferrin: 320.8 mg/dL (ref 212.0–360.0)

## 2012-08-27 ENCOUNTER — Other Ambulatory Visit: Payer: Self-pay

## 2012-08-27 DIAGNOSIS — D649 Anemia, unspecified: Secondary | ICD-10-CM

## 2012-09-24 ENCOUNTER — Other Ambulatory Visit (INDEPENDENT_AMBULATORY_CARE_PROVIDER_SITE_OTHER): Payer: BC Managed Care – PPO

## 2012-09-24 DIAGNOSIS — D649 Anemia, unspecified: Secondary | ICD-10-CM

## 2012-09-24 LAB — CBC WITH DIFFERENTIAL/PLATELET
Basophils Relative: 0.8 % (ref 0.0–3.0)
Eosinophils Absolute: 0.3 10*3/uL (ref 0.0–0.7)
Eosinophils Relative: 2.5 % (ref 0.0–5.0)
HCT: 33.6 % — ABNORMAL LOW (ref 36.0–46.0)
Hemoglobin: 9.9 g/dL — ABNORMAL LOW (ref 12.0–15.0)
Lymphs Abs: 2.1 10*3/uL (ref 0.7–4.0)
MCHC: 29.4 g/dL — ABNORMAL LOW (ref 30.0–36.0)
MCV: 72.1 fl — ABNORMAL LOW (ref 78.0–100.0)
Monocytes Absolute: 0.7 10*3/uL (ref 0.1–1.0)
Neutro Abs: 7.7 10*3/uL (ref 1.4–7.7)
RBC: 4.67 Mil/uL (ref 3.87–5.11)
WBC: 10.9 10*3/uL — ABNORMAL HIGH (ref 4.5–10.5)

## 2012-09-25 ENCOUNTER — Encounter: Payer: Self-pay | Admitting: Gastroenterology

## 2012-09-25 ENCOUNTER — Ambulatory Visit (INDEPENDENT_AMBULATORY_CARE_PROVIDER_SITE_OTHER): Payer: BC Managed Care – PPO | Admitting: Gastroenterology

## 2012-09-25 VITALS — BP 130/80 | HR 88 | Ht 64.0 in | Wt 181.0 lb

## 2012-09-25 DIAGNOSIS — D649 Anemia, unspecified: Secondary | ICD-10-CM

## 2012-09-25 NOTE — Progress Notes (Signed)
Review of gastrointestinal problems:  1. Adenomatous colon polyps, colonoscopy December 2011, 1 cm, next colonoscopy at 3 year interval  2. iron deficiency anemia; from #3?  3. hiatal hernia with Cameron's erosions, EGD dec 2011, also biopsies of dudoenum wre normal; Cameron's erosions possibly causing her anemia (microcytic, hb 7.8 at the time), anemia improved with iron supplements and proton pump inhibitor .  February 2014:  significant iron deficiency anemia January 2014. Hemoccult testing was negative. She had been off her iron supplements for several months. She was recommended to come see me to consider EGD and biopsy for sprue however I have already done that 2-3 years ago, see the above. She was transfused 2 units. Hb was 5.8. She was feeling pretty bad at the time, very SOB, fatigued. She was Hemoccult negative.  Restarted iron and PPI and blood counts slowly improved.  Hb 09/2012 9.9.   HPI: This is a   very pleasant 62 year old woman whom I last saw few months ago.  Since her last visit here 3-4 months ago, she has been taking her iron supplements regularly as well as proton pump inhibitor regularly. I have followed her blood counts and they show a nice gradual upward trend. Yesterday's hemoglobin was 9.9. Her hemoglobin was 5.8 several months ago.  She has been on iron daily.  Omeprazole daily.    No GI symptoms at all.  Weight is holding stable.  Has vertigo laying back for 1-2 seconds.  Then better.    Past Medical History  Diagnosis Date  . Iron deficiency anemia   . DVT (deep venous thrombosis) 07/2011    S/P coumadin x 6 months  . Depression   . Hyperlipidemia   . HTN (hypertension)   . Wheezing     Recently put on albuterol, no history of asthma    Past Surgical History  Procedure Laterality Date  . Appendectomy    . Tonsillectomy    . Bilateral oophrectomy      Secondary to benign cysts    Current Outpatient Prescriptions  Medication Sig Dispense Refill   . FERROUS SULFATE PO Take 65 mg by mouth daily.      . Omeprazole Magnesium (PRILOSEC OTC PO) Take by mouth daily.      Marland Kitchen amLODipine (NORVASC) 10 MG tablet Take 10 mg by mouth daily.      . citalopram (CELEXA) 20 MG tablet Take 10 mg by mouth daily.      . simvastatin (ZOCOR) 20 MG tablet Take 20 mg by mouth daily.       No current facility-administered medications for this visit.    Allergies as of 09/25/2012  . (No Known Allergies)    Family History  Problem Relation Age of Onset  . Uterine cancer Mother   . Alcoholism Father   . Breast cancer Mother   . Liver disease Father     History   Social History  . Marital Status: Married    Spouse Name: Mahasin Riviere    Number of Children: 2  . Years of Education: N/A   Occupational History  . Buyer for a company    Social History Main Topics  . Smoking status: Current Every Day Smoker -- 0.50 packs/day for 40 years    Types: Cigarettes  . Smokeless tobacco: Never Used  . Alcohol Use: 9.0 oz/week    15 Cans of beer per week  . Drug Use: No  . Sexually Active: No   Other Topics Concern  .  Not on file   Social History Narrative   Married.  Lives in husband here in [place].  Normally independent of ADLs.      Physical Exam: BP 130/80  Pulse 88  Ht 5\' 4"  (1.626 m)  Wt 181 lb (82.101 kg)  BMI 31.05 kg/m2 Constitutional: generally well-appearing Psychiatric: alert and oriented x3 Abdomen: soft, nontender, nondistended, no obvious ascites, no peritoneal signs, normal bowel sounds     Assessment and plan: 62 y.o. female with  improving iron deficiency anemia that I believe is due to Cameron's erosions in her hiatal hernia  She will continue on iron therapy once daily. She'll continue proton pump inhibitor once daily. She'll have a CBC in 3 months.

## 2012-09-25 NOTE — Patient Instructions (Addendum)
One of your biggest health concerns is your smoking.  This increases your risk for most cancers and serious cardiovascular diseases such as strokes, heart attacks.  You should try your best to stop.  If you need assistance, please contact your PCP or Smoking Cessation Class at [facility] [phone]) or [facility] Quit-Line (1-800-QUIT-NOW). Consider talking with your PCP about vertigo symptoms laying down. Continue iron daily. Continue omeprazole daily. CBC in 3 months.

## 2013-01-21 ENCOUNTER — Encounter: Payer: Self-pay | Admitting: Gastroenterology

## 2013-09-13 ENCOUNTER — Encounter: Payer: Self-pay | Admitting: Gastroenterology

## 2014-02-04 IMAGING — CR DG CHEST 1V PORT
1 series · 1 of 1 positions shown · non-contrast
Comparison: 07/17/2006; CT abdomen pelvis - 07/17/2006

CLINICAL DATA: History smoking, now with wheezing and leukocytosis

PORTABLE CHEST - 1 VIEW

[AP]
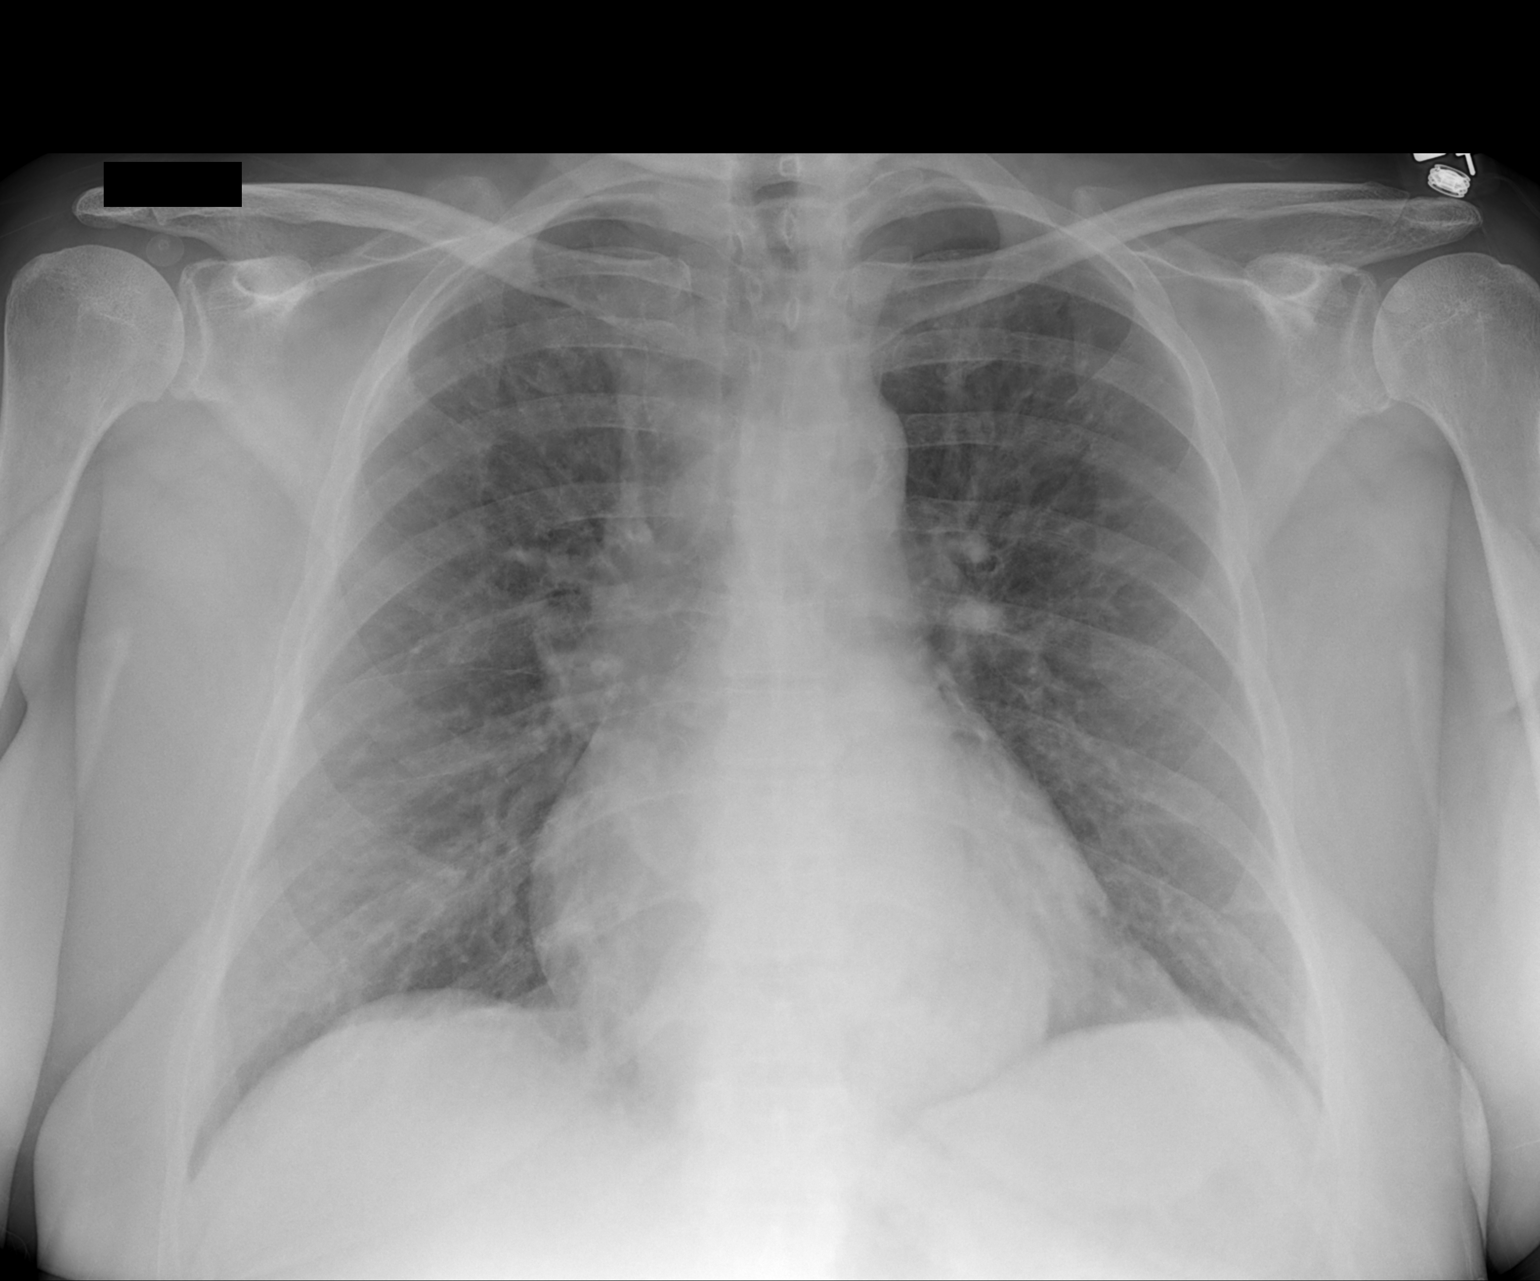

[1 of 1 positions shown; findings below may reference images not displayed]

FINDINGS: Grossly unchanged borderline enlarged cardiac silhouette and
mediastinal contours with atherosclerotic calcifications within the
thoracic aorta.  Apparent retrocardiac heterogeneous /
consolidative opacity is favored to represent the hiatal hernia
demonstrated a remote abdominal CT.  There is grossly unchanged
mild diffuse thickening of the pulmonary interstitium.  Unchanged
left basilar heterogeneous opacity.  No new focal airspace opacity.
No definite pleural effusion or pneumothorax.  Unchanged bones.
IMPRESSION: 1.  Bronchitic change without acute cardiopulmonary disease.
Further evaluation with PA and lateral chest radiograph performed
as clinically indicated.
2.  Retrocardiac opacity favored to represent a hiatal hernia seen
on remote abdominal CT.

## 2020-03-08 ENCOUNTER — Other Ambulatory Visit: Payer: Self-pay

## 2020-03-08 DIAGNOSIS — I10 Essential (primary) hypertension: Secondary | ICD-10-CM | POA: Insufficient documentation

## 2020-03-08 DIAGNOSIS — R062 Wheezing: Secondary | ICD-10-CM | POA: Insufficient documentation

## 2020-03-08 DIAGNOSIS — D509 Iron deficiency anemia, unspecified: Secondary | ICD-10-CM | POA: Insufficient documentation

## 2020-03-09 ENCOUNTER — Other Ambulatory Visit: Payer: Self-pay

## 2020-03-09 ENCOUNTER — Ambulatory Visit (INDEPENDENT_AMBULATORY_CARE_PROVIDER_SITE_OTHER): Payer: Medicare Other | Admitting: Cardiology

## 2020-03-09 VITALS — BP 142/90 | HR 103 | Ht 64.0 in | Wt 142.6 lb

## 2020-03-09 DIAGNOSIS — I509 Heart failure, unspecified: Secondary | ICD-10-CM | POA: Diagnosis not present

## 2020-03-09 DIAGNOSIS — I10 Essential (primary) hypertension: Secondary | ICD-10-CM

## 2020-03-09 DIAGNOSIS — E088 Diabetes mellitus due to underlying condition with unspecified complications: Secondary | ICD-10-CM

## 2020-03-09 DIAGNOSIS — R0600 Dyspnea, unspecified: Secondary | ICD-10-CM

## 2020-03-09 DIAGNOSIS — I5041 Acute combined systolic (congestive) and diastolic (congestive) heart failure: Secondary | ICD-10-CM

## 2020-03-09 DIAGNOSIS — E119 Type 2 diabetes mellitus without complications: Secondary | ICD-10-CM | POA: Insufficient documentation

## 2020-03-09 DIAGNOSIS — F17210 Nicotine dependence, cigarettes, uncomplicated: Secondary | ICD-10-CM

## 2020-03-09 DIAGNOSIS — E782 Mixed hyperlipidemia: Secondary | ICD-10-CM

## 2020-03-09 DIAGNOSIS — Z720 Tobacco use: Secondary | ICD-10-CM

## 2020-03-09 DIAGNOSIS — R0609 Other forms of dyspnea: Secondary | ICD-10-CM

## 2020-03-09 HISTORY — DX: Diabetes mellitus due to underlying condition with unspecified complications: E08.8

## 2020-03-09 HISTORY — DX: Acute combined systolic (congestive) and diastolic (congestive) heart failure: I50.41

## 2020-03-09 NOTE — H&P (View-Only) (Signed)
Cardiology Office Note:    Date:  03/09/2020   ID:  [name], DOB [birth year], MRN [id number]  PCP:  [name], MD  Cardiologist:  Garwin Brothers, MD   Referring MD: [name], MD    ASSESSMENT:    1. Primary hypertension   2. Tobacco abuse   3. Mixed hyperlipidemia   4. Diabetes mellitus due to underlying condition with unspecified complications (HCC)   5. Congestive heart failure, unspecified HF chronicity, unspecified heart failure type (HCC)    PLAN:    In order of problems listed above:  1. Primary prevention stressed with the patient.  Importance of compliance with diet medication stressed and she vocalized understanding 2. Dyspnea on exertion: This could possibly be suggestive of congestive heart failure.  Patient has bilateral pedal edema and this has been better with diuretic therapy.  I will do a Chem-7 for the same reason today.  To assess this I will do a Lexiscan sestamibi to see if there is any indicator of ischemia as a cause of congestive heart failure.  Also echocardiogram will be done to assess murmur on auscultation and left ventricular systolic function.  I discussed this with the patient at extensive length and she vocalized understanding. 3. Essential hypertension: Blood pressure stable and diet was emphasized.  Salt intake issues and diet emphasized extensively and she vocalized understanding.  Questions were answered to her satisfaction. 4. Diabetes mellitus: Diet emphasized. 5. Mixed dyslipidemia: On statin therapy and she is doing well with compliance with therapy. 6. Cigarette smoker: I spent 5 minutes with the patient discussing solely about smoking. Smoking cessation was counseled. I suggested to the patient also different medications and pharmacological interventions. Patient is keen to try stopping on its own at this time. He will get back to me if he needs any further assistance in this matter. 7. Patient will be seen in  follow-up appointment in 2 months or earlier if the patient has any concerns    Medication Adjustments/Labs and Tests Ordered: Current medicines are reviewed at length with the patient today.  Concerns regarding medicines are outlined above.  No orders of the defined types were placed in this encounter.  No orders of the defined types were placed in this encounter.    History of Present Illness:    [name] is a 69 y.o. female who is being seen today for the evaluation of shortness of breath on exertion and abnormal EKG at the request of Sl[place]ky, Excell Seltzer., MD.  Patient is a pleasant 69 year old female.  She has past medical history of essential hypertension dyslipidemia diabetes mellitus and heavy cigarette smoking in the past.  Patient mentions to me that she is referred here for abnormal EKG.  Chest x-ray revealed aortic atherosclerosis and she has been noticing shortness of breath on exertion.  No orthopnea or PND.  No chest tightness.  No radiation to the neck or to the arm or any such symptoms.  At the time of my evaluation, the patient is alert awake oriented and in no distress.  This is been happening for the past 2 to 3 weeks.  No syncope.  Past Medical History:  Diagnosis Date  . Depression   . Diabetes mellitus, type 2 (HCC)   . DVT (deep venous thrombosis) (HCC) 07/2011   S/P coumadin x 6 months  . HTN (hypertension)   . Hyperlipidemia   . Hypertension   . Hypokalemia 05/08/2012  . Hypotension 05/08/2012  .  Iron deficiency anemia   . Iron deficiency anemia, unspecified, symptomatic with dyspnea, dizziness, and shortness of breath with activity 05/08/2012  . Leukocytosis 05/08/2012  . Tobacco abuse 05/08/2012  . Wheezing    Recently put on albuterol, no history of asthma    Past Surgical History:  Procedure Laterality Date  . APPENDECTOMY    . Bilateral oophrectomy     Secondary to benign cysts  . colonoscopy     with polypectomy  . TONSILLECTOMY       Current Medications: Current Meds  Medication Sig  . amLODipine (NORVASC) 10 MG tablet Take 10 mg by mouth daily.  Marland Kitchen aspirin 81 MG EC tablet Take 81 mg by mouth daily.  . Ferrous Sulfate (IRON PO) Take 65 mg by mouth. 4-5 times a week  . furosemide (LASIX) 20 MG tablet Take 20 mg by mouth daily.  Marland Kitchen lisinopril (ZESTRIL) 2.5 MG tablet Take 2.5 mg by mouth daily.  . metFORMIN (GLUCOPHAGE-XR) 500 MG 24 hr tablet Take 500 mg by mouth daily.  . Multiple Vitamin (MULTIVITAMIN) tablet Take 1 tablet by mouth daily. PreserVision  . simvastatin (ZOCOR) 20 MG tablet Take 20 mg by mouth daily.     Allergies:   Patient has no known allergies.   Social History   Socioeconomic History  . Marital status: Married    Spouse name: [name]  . Number of children: 2  . Years of education: Not on file  . Highest education level: Not on file  Occupational History  . Occupation: Production manager for a company    Employer: [employer]  Tobacco Use  . Smoking status: Current Every Day Smoker    Packs/day: 0.50    Years: 40.00    Pack years: 20.00    Types: Cigarettes  . Smokeless tobacco: Never Used  Substance and Sexual Activity  . Alcohol use: Yes    Alcohol/week: 15.0 standard drinks    Types: 15 Cans of beer per week  . Drug use: No  . Sexual activity: Never  Other Topics Concern  . Not on file  Social History Narrative   Married.  Lives in husband here in [place].  Normally independent of ADLs.   Social Determinants of Health   Financial Resource Strain:   . Difficulty of Paying Living Expenses: Not on file  Food Insecurity:   . Worried About Programme researcher, broadcasting/film/video in the Last Year: Not on file  . Ran Out of Food in the Last Year: Not on file  Transportation Needs:   . Lack of Transportation (Medical): Not on file  . Lack of Transportation (Non-Medical): Not on file  Physical Activity:   . Days of Exercise per Week: Not on file  . Minutes of Exercise per Session: Not on file   Stress:   . Feeling of Stress : Not on file  Social Connections:   . Frequency of Communication with Friends and Family: Not on file  . Frequency of Social Gatherings with Friends and Family: Not on file  . Attends Religious Services: Not on file  . Active Member of Clubs or Organizations: Not on file  . Attends Banker Meetings: Not on file  . Marital Status: Not on file     Family History: The patient's family history includes Alcoholism in her father; Breast cancer in her mother; CAD in her paternal grandmother; COPD in her mother; Diabetes in her maternal grandmother and paternal grandmother; Liver disease in her father; Stroke in her  maternal grandfather and paternal grandfather; Uterine cancer in her mother.  ROS:   Please see the history of present illness.    All other systems reviewed and are negative.  EKGs/Labs/Other Studies Reviewed:    The following studies were reviewed today: EKG reveals sinus rhythm and nonspecific ST-T changes and poor anterior forces.  Septal infarction of undetermined age.   Recent Labs: No results found for requested labs within last 8760 hours.  Recent Lipid Panel No results found for: CHOL, TRIG, HDL, CHOLHDL, VLDL, LDLCALC, LDLDIRECT  Physical Exam:    VS:  BP (!) 142/90   Pulse (!) 103   Ht 5\' 4"  (1.626 m)   Wt 142 lb 9.6 oz (64.7 kg)   SpO2 93%   BMI 24.48 kg/m     Wt Readings from Last 3 Encounters:  03/09/20 142 lb 9.6 oz (64.7 kg)  09/25/12 181 lb (82.1 kg)  06/11/12 176 lb (79.8 kg)     GEN: Patient is in no acute distress HEENT: Normal NECK: No JVD; No carotid bruits LYMPHATICS: No lymphadenopathy CARDIAC: S1 S2 regular, 2/6 systolic murmur at the apex. RESPIRATORY:  Clear to auscultation without rales, wheezing or rhonchi  ABDOMEN: Soft, non-tender, non-distended MUSCULOSKELETAL:  No edema; No deformity  SKIN: Warm and dry NEUROLOGIC:  Alert and oriented x 3 PSYCHIATRIC:  Normal affect     Signed, 06/13/12, MD  03/09/2020 3:08 PM    [facility]

## 2020-03-09 NOTE — Patient Instructions (Signed)
Medication Instructions:  No medication changes. *If you need a refill on your cardiac medications before your next appointment, please call your pharmacy*   Lab Work:  Your physician recommends that you have a BMET done today in the office.  If you have labs (blood work) drawn today and your tests are completely normal, you will receive your results only by: Marland Kitchen MyChart Message (if you have MyChart) OR . A paper copy in the mail If you have any lab test that is abnormal or we need to change your treatment, we will call you to review the results.   Testing/Procedures: Your physician has requested that you have an echocardiogram. Echocardiography is a painless test that uses sound waves to create images of your heart. It provides your doctor with information about the size and shape of your heart and how well your heart's chambers and valves are working. This procedure takes approximately one hour. There are no restrictions for this procedure.  Your physician has requested that you have a lexiscan myoview. For further information please visit [email or web address] Please follow instruction sheet, as given.  The test will take approximately 3 to 4 hours to complete; you may bring reading material.  If someone comes with you to your appointment, they will need to remain in the main lobby due to limited space in the testing area. **If you are pregnant or breastfeeding, please notify the nuclear lab prior to your appointment**  How to prepare for your Myocardial Perfusion Test: . Do not eat or drink 3 hours prior to your test, except you may have water. . Do not consume products containing caffeine (regular or decaffeinated) 12 hours prior to your test. (ex: coffee, chocolate, sodas, tea). . Do bring a list of your current medications with you.  If not listed below, you may take your medications as normal. . Do wear comfortable clothes (no dresses or overalls) and walking shoes, tennis shoes  preferred (No heels or open toe shoes are allowed). . Do NOT wear cologne, perfume, aftershave, or lotions (deodorant is allowed). . If these instructions are not followed, your test will have to be rescheduled.    Follow-Up: At [facility], you and your health needs are our priority.  As part of our continuing mission to provide you with exceptional heart care, we have created designated Provider Care Teams.  These Care Teams include your primary Cardiologist (physician) and Advanced Practice Providers (APPs -  Physician Assistants and Nurse Practitioners) who all work together to provide you with the care you need, when you need it.  We recommend signing up for the patient portal called "MyChart".  Sign up information is provided on this After Visit Summary.  MyChart is used to connect with patients for Virtual Visits (Telemedicine).  Patients are able to view lab/test results, encounter notes, upcoming appointments, etc.  Non-urgent messages can be sent to your provider as well.   To learn more about what you can do with MyChart, go to [email or web address].    Your next appointment:   1 month(s)  The format for your next appointment:   In Person  Provider:   Belva Crome, MD   Other Instructions  Cardiac Nuclear Scan  A cardiac nuclear scan is a test that is done to check the flow of blood to your heart. It is done when you are resting and when you are exercising. The test looks for problems such as:  Not enough blood reaching a portion of the heart.  The  heart muscle not working as it should. You may need this test if:  You have heart disease.  You have had lab results that are not normal.  You have had heart surgery or a balloon procedure to open up blocked arteries (angioplasty).  You have chest pain.  You have shortness of breath. In this test, a special dye (tracer) is put into your bloodstream. The tracer will travel to your heart. A camera will then take  pictures of your heart to see how the tracer moves through your heart. This test is usually done at a hospital and takes 2-4 hours. Tell a doctor about:  Any allergies you have.  All medicines you are taking, including vitamins, herbs, eye drops, creams, and over-the-counter medicines.  Any problems you or family members have had with anesthetic medicines.  Any blood disorders you have.  Any surgeries you have had.  Any medical conditions you have.  Whether you are pregnant or may be pregnant. What are the risks? Generally, this is a safe test. However, problems may occur, such as:  Serious chest pain and heart attack. This is only a risk if the stress portion of the test is done.  Rapid heartbeat.  A feeling of warmth in your chest. This feeling usually does not last long.  Allergic reaction to the tracer. What happens before the test?  Ask your doctor about changing or stopping your normal medicines. This is important.  Follow instructions from your doctor about what you cannot eat or drink.  Remove your jewelry on the day of the test. What happens during the test? 1. An IV tube will be inserted into one of your veins. 2. Your doctor will give you a small amount of tracer through the IV tube. 3. You will wait for 20-40 minutes while the tracer moves through your bloodstream. 4. Your heart will be monitored with an electrocardiogram (ECG). 5. You will lie down on an exam table. 6. Pictures of your heart will be taken for about 15-20 minutes. 7. You may also have a stress test. For this test, one of these things may be done: ? You will be asked to exercise on a treadmill or a stationary bike. ? You will be given medicines that will make your heart work harder. This is done if you are unable to exercise. 8. When blood flow to your heart has peaked, a tracer will again be given through the IV tube. 9. After 20-40 minutes, you will get back on the exam table. More pictures  will be taken of your heart. 10. Depending on the tracer that is used, more pictures may need to be taken 3-4 hours later. 11. Your IV tube will be removed when the test is over. The test may vary among doctors and hospitals. What happens after the test? 1. Ask your doctor: ? Whether you can return to your normal schedule, including diet, activities, and medicines. ? Whether you should drink more fluids. This will help to remove the tracer from your body. Drink enough fluid to keep your pee (urine) pale yellow. 2. Ask your doctor, or the department that is doing the test: ? When will my results be ready? ? How will I get my results? Summary  A cardiac nuclear scan is a test that is done to check the flow of blood to your heart.  Tell your doctor whether you are pregnant or may be pregnant.  Before the test, ask your doctor about changing or stopping  your normal medicines. This is important.  Ask your doctor whether you can return to your normal activities. You may be asked to drink more fluids. This information is not intended to replace advice given to you by your health care provider. Make sure you discuss any questions you have with your health care provider. Document Revised: 07/24/2018 Document Reviewed: 09/17/2017 Elsevier Patient Education  2020 [employer].  Echocardiogram An echocardiogram is a procedure that uses painless sound waves (ultrasound) to produce an image of the heart. Images from an echocardiogram can provide important information about:  Signs of coronary artery disease (CAD).  Aneurysm detection. An aneurysm is a weak or damaged part of an artery wall that bulges out from the normal force of blood pumping through the body.  Heart size and shape. Changes in the size or shape of the heart can be associated with certain conditions, including heart failure, aneurysm, and CAD.  Heart muscle function.  Heart valve function.  Signs of a past heart  attack.  Fluid buildup around the heart.  Thickening of the heart muscle.  A tumor or infectious growth around the heart valves. Tell a health care provider about:  Any allergies you have.  All medicines you are taking, including vitamins, herbs, eye drops, creams, and over-the-counter medicines.  Any blood disorders you have.  Any surgeries you have had.  Any medical conditions you have.  Whether you are pregnant or may be pregnant. What are the risks? Generally, this is a safe procedure. However, problems may occur, including:  Allergic reaction to dye (contrast) that may be used during the procedure. What happens before the procedure? No specific preparation is needed. You may eat and drink normally. What happens during the procedure?    An IV tube may be inserted into one of your veins.  You may receive contrast through this tube. A contrast is an injection that improves the quality of the pictures from your heart.  A gel will be applied to your chest.  A wand-like tool (transducer) will be moved over your chest. The gel will help to transmit the sound waves from the transducer.  The sound waves will harmlessly bounce off of your heart to allow the heart images to be captured in real-time motion. The images will be recorded on a computer. The procedure may vary among health care providers and hospitals. What happens after the procedure?  You may return to your normal, everyday life, including diet, activities, and medicines, unless your health care provider tells you not to do that. Summary  An echocardiogram is a procedure that uses painless sound waves (ultrasound) to produce an image of the heart.  Images from an echocardiogram can provide important information about the size and shape of your heart, heart muscle function, heart valve function, and fluid buildup around your heart.  You do not need to do anything to prepare before this procedure. You may eat and  drink normally.  After the echocardiogram is completed, you may return to your normal, everyday life, unless your health care provider tells you not to do that. This information is not intended to replace advice given to you by your health care provider. Make sure you discuss any questions you have with your health care provider. Document Revised: 07/25/2018 Document Reviewed: 05/06/2016 Elsevier Patient Education  2020 [employer].

## 2020-03-09 NOTE — Progress Notes (Signed)
Cardiology Office Note:    Date:  03/09/2020   ID:  [name], DOB [birth year], MRN [id number]  PCP:  [name], MD  Cardiologist:  Garwin Brothers, MD   Referring MD: [name], MD    ASSESSMENT:    1. Primary hypertension   2. Tobacco abuse   3. Mixed hyperlipidemia   4. Diabetes mellitus due to underlying condition with unspecified complications (HCC)   5. Congestive heart failure, unspecified HF chronicity, unspecified heart failure type (HCC)    PLAN:    In order of problems listed above:  1. Primary prevention stressed with the patient.  Importance of compliance with diet medication stressed and she vocalized understanding 2. Dyspnea on exertion: This could possibly be suggestive of congestive heart failure.  Patient has bilateral pedal edema and this has been better with diuretic therapy.  I will do a Chem-7 for the same reason today.  To assess this I will do a Lexiscan sestamibi to see if there is any indicator of ischemia as a cause of congestive heart failure.  Also echocardiogram will be done to assess murmur on auscultation and left ventricular systolic function.  I discussed this with the patient at extensive length and she vocalized understanding. 3. Essential hypertension: Blood pressure stable and diet was emphasized.  Salt intake issues and diet emphasized extensively and she vocalized understanding.  Questions were answered to her satisfaction. 4. Diabetes mellitus: Diet emphasized. 5. Mixed dyslipidemia: On statin therapy and she is doing well with compliance with therapy. 6. Cigarette smoker: I spent 5 minutes with the patient discussing solely about smoking. Smoking cessation was counseled. I suggested to the patient also different medications and pharmacological interventions. Patient is keen to try stopping on its own at this time. He will get back to me if he needs any further assistance in this matter. 7. Patient will be seen in  follow-up appointment in 2 months or earlier if the patient has any concerns    Medication Adjustments/Labs and Tests Ordered: Current medicines are reviewed at length with the patient today.  Concerns regarding medicines are outlined above.  No orders of the defined types were placed in this encounter.  No orders of the defined types were placed in this encounter.    History of Present Illness:    [name] is a 69 y.o. female who is being seen today for the evaluation of shortness of breath on exertion and abnormal EKG at the request of [place], Excell Seltzer., MD.  Patient is a pleasant 69 year old female.  She has past medical history of essential hypertension dyslipidemia diabetes mellitus and heavy cigarette smoking in the past.  Patient mentions to me that she is referred here for abnormal EKG.  Chest x-ray revealed aortic atherosclerosis and she has been noticing shortness of breath on exertion.  No orthopnea or PND.  No chest tightness.  No radiation to the neck or to the arm or any such symptoms.  At the time of my evaluation, the patient is alert awake oriented and in no distress.  This is been happening for the past 2 to 3 weeks.  No syncope.  Past Medical History:  Diagnosis Date  . Depression   . Diabetes mellitus, type 2 (HCC)   . DVT (deep venous thrombosis) (HCC) 07/2011   S/P coumadin x 6 months  . HTN (hypertension)   . Hyperlipidemia   . Hypertension   . Hypokalemia 05/08/2012  . Hypotension 05/08/2012  .  Iron deficiency anemia   . Iron deficiency anemia, unspecified, symptomatic with dyspnea, dizziness, and shortness of breath with activity 05/08/2012  . Leukocytosis 05/08/2012  . Tobacco abuse 05/08/2012  . Wheezing    Recently put on albuterol, no history of asthma    Past Surgical History:  Procedure Laterality Date  . APPENDECTOMY    . Bilateral oophrectomy     Secondary to benign cysts  . colonoscopy     with polypectomy  . TONSILLECTOMY       Current Medications: Current Meds  Medication Sig  . amLODipine (NORVASC) 10 MG tablet Take 10 mg by mouth daily.  Marland Kitchen aspirin 81 MG EC tablet Take 81 mg by mouth daily.  . Ferrous Sulfate (IRON PO) Take 65 mg by mouth. 4-5 times a week  . furosemide (LASIX) 20 MG tablet Take 20 mg by mouth daily.  Marland Kitchen lisinopril (ZESTRIL) 2.5 MG tablet Take 2.5 mg by mouth daily.  . metFORMIN (GLUCOPHAGE-XR) 500 MG 24 hr tablet Take 500 mg by mouth daily.  . Multiple Vitamin (MULTIVITAMIN) tablet Take 1 tablet by mouth daily. PreserVision  . simvastatin (ZOCOR) 20 MG tablet Take 20 mg by mouth daily.     Allergies:   Patient has no known allergies.   Social History   Socioeconomic History  . Marital status: Married    Spouse name: Dariann Huckaba  . Number of children: 2  . Years of education: Not on file  . Highest education level: Not on file  Occupational History  . Occupation: Production manager for a company    Employer: [employer]  Tobacco Use  . Smoking status: Current Every Day Smoker    Packs/day: 0.50    Years: 40.00    Pack years: 20.00    Types: Cigarettes  . Smokeless tobacco: Never Used  Substance and Sexual Activity  . Alcohol use: Yes    Alcohol/week: 15.0 standard drinks    Types: 15 Cans of beer per week  . Drug use: No  . Sexual activity: Never  Other Topics Concern  . Not on file  Social History Narrative   Married.  Lives in husband here in [place].  Normally independent of ADLs.   Social Determinants of Health   Financial Resource Strain:   . Difficulty of Paying Living Expenses: Not on file  Food Insecurity:   . Worried About Programme researcher, broadcasting/film/video in the Last Year: Not on file  . Ran Out of Food in the Last Year: Not on file  Transportation Needs:   . Lack of Transportation (Medical): Not on file  . Lack of Transportation (Non-Medical): Not on file  Physical Activity:   . Days of Exercise per Week: Not on file  . Minutes of Exercise per Session: Not on file   Stress:   . Feeling of Stress : Not on file  Social Connections:   . Frequency of Communication with Friends and Family: Not on file  . Frequency of Social Gatherings with Friends and Family: Not on file  . Attends Religious Services: Not on file  . Active Member of Clubs or Organizations: Not on file  . Attends Banker Meetings: Not on file  . Marital Status: Not on file     Family History: The patient's family history includes Alcoholism in her father; Breast cancer in her mother; CAD in her paternal grandmother; COPD in her mother; Diabetes in her maternal grandmother and paternal grandmother; Liver disease in her father; Stroke in her  maternal grandfather and paternal grandfather; Uterine cancer in her mother.  ROS:   Please see the history of present illness.    All other systems reviewed and are negative.  EKGs/Labs/Other Studies Reviewed:    The following studies were reviewed today: EKG reveals sinus rhythm and nonspecific ST-T changes and poor anterior forces.  Septal infarction of undetermined age.   Recent Labs: No results found for requested labs within last 8760 hours.  Recent Lipid Panel No results found for: CHOL, TRIG, HDL, CHOLHDL, VLDL, LDLCALC, LDLDIRECT  Physical Exam:    VS:  BP (!) 142/90   Pulse (!) 103   Ht 5\' 4"  (1.626 m)   Wt 142 lb 9.6 oz (64.7 kg)   SpO2 93%   BMI 24.48 kg/m     Wt Readings from Last 3 Encounters:  03/09/20 142 lb 9.6 oz (64.7 kg)  09/25/12 181 lb (82.1 kg)  06/11/12 176 lb (79.8 kg)     GEN: Patient is in no acute distress HEENT: Normal NECK: No JVD; No carotid bruits LYMPHATICS: No lymphadenopathy CARDIAC: S1 S2 regular, 2/6 systolic murmur at the apex. RESPIRATORY:  Clear to auscultation without rales, wheezing or rhonchi  ABDOMEN: Soft, non-tender, non-distended MUSCULOSKELETAL:  No edema; No deformity  SKIN: Warm and dry NEUROLOGIC:  Alert and oriented x 3 PSYCHIATRIC:  Normal affect     Signed, 06/13/12, MD  03/09/2020 3:08 PM    [facility]

## 2020-03-10 ENCOUNTER — Telehealth (HOSPITAL_COMMUNITY): Payer: Self-pay | Admitting: *Deleted

## 2020-03-10 LAB — BASIC METABOLIC PANEL
BUN/Creatinine Ratio: 14 (ref 12–28)
BUN: 11 mg/dL (ref 8–27)
CO2: 27 mmol/L (ref 20–29)
Calcium: 9.1 mg/dL (ref 8.7–10.3)
Chloride: 101 mmol/L (ref 96–106)
Creatinine, Ser: 0.78 mg/dL (ref 0.57–1.00)
GFR calc Af Amer: 90 mL/min/{1.73_m2} (ref >59)
GFR calc non Af Amer: 78 mL/min/{1.73_m2} (ref >59)
Glucose: 124 mg/dL — ABNORMAL HIGH (ref 65–99)
Potassium: 3.6 mmol/L (ref 3.5–5.2)
Sodium: 145 mmol/L — ABNORMAL HIGH (ref 134–144)

## 2020-03-10 NOTE — Telephone Encounter (Signed)
Patient given detailed instructions per Myocardial Perfusion Study Information Sheet for the test on 03/17/20 at 11:15. Patient notified to arrive 15 minutes early and that it is imperative to arrive on time for appointment to keep from having the test rescheduled.  If you need to cancel or reschedule your appointment, please call the office within 24 hours of your appointment. . Patient verbalized understanding.[name]

## 2020-03-17 ENCOUNTER — Ambulatory Visit (INDEPENDENT_AMBULATORY_CARE_PROVIDER_SITE_OTHER): Payer: Medicare Other

## 2020-03-17 ENCOUNTER — Other Ambulatory Visit: Payer: Self-pay

## 2020-03-17 DIAGNOSIS — E088 Diabetes mellitus due to underlying condition with unspecified complications: Secondary | ICD-10-CM | POA: Diagnosis not present

## 2020-03-17 DIAGNOSIS — I509 Heart failure, unspecified: Secondary | ICD-10-CM

## 2020-03-17 DIAGNOSIS — R0600 Dyspnea, unspecified: Secondary | ICD-10-CM

## 2020-03-17 DIAGNOSIS — R0609 Other forms of dyspnea: Secondary | ICD-10-CM

## 2020-03-17 LAB — MYOCARDIAL PERFUSION IMAGING
LV dias vol: 199 mL (ref 46–106)
LV sys vol: 158 mL
Peak HR: 95 {beats}/min
Rest HR: 92 {beats}/min
SDS: 1
SRS: 9
SSS: 10
TID: 1.02

## 2020-03-17 MED ORDER — TECHNETIUM TC 99M TETROFOSMIN IV KIT
31.3000 | PACK | Freq: Once | INTRAVENOUS | Status: AC | PRN
  Administered 2020-03-17: 31.3 via INTRAVENOUS

## 2020-03-17 MED ORDER — TECHNETIUM TC 99M TETROFOSMIN IV KIT
10.3000 | PACK | Freq: Once | INTRAVENOUS | Status: AC | PRN
  Administered 2020-03-17: 10.3 via INTRAVENOUS

## 2020-03-17 MED ORDER — REGADENOSON 0.4 MG/5ML IV SOLN
0.4000 mg | Freq: Once | INTRAVENOUS | Status: AC
  Administered 2020-03-17: 0.4 mg via INTRAVENOUS

## 2020-03-19 ENCOUNTER — Encounter: Payer: Self-pay | Admitting: Cardiology

## 2020-03-19 ENCOUNTER — Other Ambulatory Visit: Payer: Self-pay

## 2020-03-19 ENCOUNTER — Ambulatory Visit (INDEPENDENT_AMBULATORY_CARE_PROVIDER_SITE_OTHER): Payer: Medicare Other | Admitting: Cardiology

## 2020-03-19 VITALS — BP 128/77 | HR 96 | Ht 64.0 in | Wt 141.6 lb

## 2020-03-19 DIAGNOSIS — I209 Angina pectoris, unspecified: Secondary | ICD-10-CM

## 2020-03-19 DIAGNOSIS — R0600 Dyspnea, unspecified: Secondary | ICD-10-CM

## 2020-03-19 DIAGNOSIS — Z720 Tobacco use: Secondary | ICD-10-CM | POA: Diagnosis not present

## 2020-03-19 DIAGNOSIS — E782 Mixed hyperlipidemia: Secondary | ICD-10-CM

## 2020-03-19 DIAGNOSIS — R0609 Other forms of dyspnea: Secondary | ICD-10-CM | POA: Insufficient documentation

## 2020-03-19 DIAGNOSIS — E088 Diabetes mellitus due to underlying condition with unspecified complications: Secondary | ICD-10-CM | POA: Diagnosis not present

## 2020-03-19 DIAGNOSIS — I5022 Chronic systolic (congestive) heart failure: Secondary | ICD-10-CM | POA: Diagnosis not present

## 2020-03-19 HISTORY — DX: Dyspnea, unspecified: R06.00

## 2020-03-19 HISTORY — DX: Angina pectoris, unspecified: I20.9

## 2020-03-19 HISTORY — DX: Other forms of dyspnea: R06.09

## 2020-03-19 MED ORDER — NITROGLYCERIN 0.4 MG SL SUBL: 0.4000 mg | SUBLINGUAL_TABLET | SUBLINGUAL | 6 refills | Status: DC | PRN

## 2020-03-19 MED ORDER — FUROSEMIDE 20 MG PO TABS
20.0000 mg | ORAL_TABLET | Freq: Every day | ORAL | 6 refills | Status: DC
Start: 2020-03-19 — End: 2020-04-21

## 2020-03-19 NOTE — Progress Notes (Signed)
Cardiology Office Note:    Date:  03/19/2020   ID:  [name], DOB 1950-08-05, MRN [id number]  PCP:  [name], MD  Cardiologist:  Garwin Brothers, MD   Referring MD: [name], MD    ASSESSMENT:    1. Mixed hyperlipidemia   2. Chronic systolic congestive heart failure (HCC)   3. Tobacco abuse   4. Diabetes mellitus due to underlying condition with unspecified complications (HCC)   5. Angina pectoris (HCC)   6. Dyspnea on exertion    PLAN:    In order of problems listed above:  1. Abnormal nuclear stress test: I discussed my findings with the patient in extensive length.  Her stress test significantly abnormal and her ejection fraction is significantly depressed.  I discussed coronary angiography and left heart catheterization.  Her symptoms are concerning.I discussed coronary angiography and left heart catheterization with the patient at extensive length. Procedure, benefits and potential risks were explained. Patient had multiple questions which were answered to the patient's satisfaction. Patient agreed and consented for the procedure. Further recommendations will be made based on the findings of the coronary angiography. In the interim. The patient has any significant symptoms he knows to go to the nearest emergency room. 2. Essential hypertension: Blood pressure stable and diet was emphasized. 3. Mixed dyslipidemia: Diet was emphasized and she is on statin therapy. 4. She was advised to take a coated aspirin daily 81 mg.  Sublingual nitroglycerin prescription was sent, its protocol and 911 protocol explained and the patient vocalized understanding questions were answered to the patient's satisfaction.  Further recommendations will be made based on the findings of the coronary angiography.  She was advised to quit smoking and she promises to do so.   Medication Adjustments/Labs and Tests Ordered: Current medicines are reviewed at length with the patient  today.  Concerns regarding medicines are outlined above.  Orders Placed This Encounter  Procedures  . Basic metabolic panel  . CBC with Differential/Platelet   Meds ordered this encounter  Medications  . nitroGLYCERIN (NITROSTAT) 0.4 MG SL tablet    Sig: Place 1 tablet (0.4 mg total) under the tongue every 5 (five) minutes as needed.    Dispense:  25 tablet    Refill:  6  . furosemide (LASIX) 20 MG tablet    Sig: Take 1 tablet (20 mg total) by mouth daily.    Dispense:  30 tablet    Refill:  6     No chief complaint on file.    History of Present Illness:    [name] is a 69 y.o. female patient was evaluated for shortness of breath on exertion.  She has history of hypertension dyslipidemia diabetes mellitus and is an active cigarette smoker.  Her stress test is abnormal and very significant abnormal with significantly depressed ejection fraction.  The results are mentioned in detail below.  I called her to discuss these issues.  She mentions to me that she has shortness of breath on exertion.  Unfortunately she continues to smoke.  Past Medical History:  Diagnosis Date  . Depression   . Diabetes mellitus, type 2 (HCC)   . DVT (deep venous thrombosis) (HCC) 07/2011   S/P coumadin x 6 months  . HTN (hypertension)   . Hyperlipidemia   . Hypertension   . Hypokalemia 05/08/2012  . Hypotension 05/08/2012  . Iron deficiency anemia   . Iron deficiency anemia, unspecified, symptomatic with dyspnea, dizziness, and shortness of breath  with activity 05/08/2012  . Leukocytosis 05/08/2012  . Tobacco abuse 05/08/2012  . Wheezing    Recently put on albuterol, no history of asthma    Past Surgical History:  Procedure Laterality Date  . APPENDECTOMY    . Bilateral oophrectomy     Secondary to benign cysts  . colonoscopy     with polypectomy  . TONSILLECTOMY      Current Medications: Current Meds  Medication Sig  . amLODipine (NORVASC) 10 MG tablet Take 10 mg by mouth  daily.  Marland Kitchen aspirin 81 MG EC tablet Take 81 mg by mouth daily.  . Ferrous Sulfate (IRON PO) Take 65 mg by mouth. 4-5 times a week  . furosemide (LASIX) 20 MG tablet Take 1 tablet (20 mg total) by mouth daily.  Marland Kitchen lisinopril (ZESTRIL) 2.5 MG tablet Take 2.5 mg by mouth daily.  . metFORMIN (GLUCOPHAGE-XR) 500 MG 24 hr tablet Take 500 mg by mouth daily.  . Multiple Vitamin (MULTIVITAMIN) tablet Take 1 tablet by mouth daily. PreserVision  . simvastatin (ZOCOR) 20 MG tablet Take 20 mg by mouth daily.  . [DISCONTINUED] furosemide (LASIX) 20 MG tablet Take 20 mg by mouth daily.     Allergies:   Patient has no known allergies.   Social History   Socioeconomic History  . Marital status: Married    Spouse name: Namiko Pritts  . Number of children: 2  . Years of education: Not on file  . Highest education level: Not on file  Occupational History  . Occupation: Production manager for a company    Employer: [employer]  Tobacco Use  . Smoking status: Current Every Day Smoker    Packs/day: 0.50    Years: 40.00    Pack years: 20.00    Types: Cigarettes  . Smokeless tobacco: Never Used  Substance and Sexual Activity  . Alcohol use: Yes    Alcohol/week: 15.0 standard drinks    Types: 15 Cans of beer per week  . Drug use: No  . Sexual activity: Never  Other Topics Concern  . Not on file  Social History Narrative   Married.  Lives in husband here in [place].  Normally independent of ADLs.   Social Determinants of Health   Financial Resource Strain:   . Difficulty of Paying Living Expenses: Not on file  Food Insecurity:   . Worried About Programme researcher, broadcasting/film/video in the Last Year: Not on file  . Ran Out of Food in the Last Year: Not on file  Transportation Needs:   . Lack of Transportation (Medical): Not on file  . Lack of Transportation (Non-Medical): Not on file  Physical Activity:   . Days of Exercise per Week: Not on file  . Minutes of Exercise per Session: Not on file  Stress:   . Feeling of  Stress : Not on file  Social Connections:   . Frequency of Communication with Friends and Family: Not on file  . Frequency of Social Gatherings with Friends and Family: Not on file  . Attends Religious Services: Not on file  . Active Member of Clubs or Organizations: Not on file  . Attends Banker Meetings: Not on file  . Marital Status: Not on file     Family History: The patient's family history includes Alcoholism in her father; Breast cancer in her mother; CAD in her paternal grandmother; COPD in her mother; Diabetes in her maternal grandmother and paternal grandmother; Liver disease in her father; Stroke in her maternal  grandfather and paternal grandfather; Uterine cancer in her mother.  ROS:   Please see the history of present illness.    All other systems reviewed and are negative.  EKGs/Labs/Other Studies Reviewed:    The following studies were reviewed today: Study Highlights   Nuclear stress EF: 20%.  The left ventricular ejection fraction is severely decreased (<30%).  There was no ST segment deviation noted during stress.  T wave inversion was noted during stress in the II, III, aVF, V5 and V6 leads.  Defect 1: There is a large defect with mild reversibility of severe severity present in the basal inferior, mid inferior and apical inferior location with wall hypokinesis of the inferior wall.  Defect 2: There is a medium reversible defect of mild severity present in the mid anteroseptal location, with normal wall motion.  Findings is consistent with two seperate defects: ischemia the LAD territory and prior myocardial infarction with peri-infarct ischemia in the RCA territory.  This is a high risk study.    Recent Labs: 03/09/2020: BUN 11; Creatinine, Ser 0.78; Potassium 3.6; Sodium 145  Recent Lipid Panel No results found for: CHOL, TRIG, HDL, CHOLHDL, VLDL, LDLCALC, LDLDIRECT  Physical Exam:    VS:  BP 128/77   Pulse 96   Ht 5\' 4"  (1.626 m)    Wt 141 lb 9.6 oz (64.2 kg)   SpO2 94%   BMI 24.31 kg/m     Wt Readings from Last 3 Encounters:  03/19/20 141 lb 9.6 oz (64.2 kg)  03/09/20 142 lb 9.6 oz (64.7 kg)  09/25/12 181 lb (82.1 kg)     GEN: Patient is in no acute distress HEENT: Normal NECK: No JVD; No carotid bruits LYMPHATICS: No lymphadenopathy CARDIAC: Hear sounds regular, 2/6 systolic murmur at the apex. RESPIRATORY:  Clear to auscultation without rales, wheezing or rhonchi  ABDOMEN: Soft, non-tender, non-distended MUSCULOSKELETAL:  No edema; No deformity  SKIN: Warm and dry NEUROLOGIC:  Alert and oriented x 3 PSYCHIATRIC:  Normal affect   Signed, 11/25/12, MD  03/19/2020 10:45 AM    [facility]

## 2020-03-19 NOTE — Patient Instructions (Addendum)
Medication Instructions:  Your physician has recommended you make the following change in your medication:   Take nitroglycerin as needed for chest pain.  *If you need a refill on your cardiac medications before your next appointment, please call your pharmacy*   Lab Work: You had your labs done today in the office for your upcoming cath. If you have labs (blood work) drawn today and your tests are completely normal, you will receive your results only by: Marland Kitchen MyChart Message (if you have MyChart) OR . A paper copy in the mail If you have any lab test that is abnormal or we need to change your treatment, we will call you to review the results.   Testing/Procedures:    [facility] CARDIOVASCULAR DIV[facility] [place] [address] [place] [address] Dept: [phone] Loc: [phone]  [name]  03/19/2020  You are scheduled for a Cardiac Catheterization on Monday, December 13 with Dr. Bryan Lemma.  1. Please arrive at the [facility] (Main Entrance A) at [facility]: [address] [place], [place] [address] at 5:30 AM (This time is two hours before your procedure to ensure your preparation). Free valet parking service is available.   Special note: Every effort is made to have your procedure done on time. Please understand that emergencies sometimes delay scheduled procedures.  2. Diet: Do not eat solid foods after midnight.  The patient may have clear liquids until 5am upon the day of the procedure.  3. Labs: You had your labs in the office.  4. Medication instructions in preparation for your procedure:   Contrast Allergy: No  Stop taking, Lisinopril (Zestril or Prinivil) Monday, December 13,  Do not take Diabetes Med Glucophage (Metformin) on the day of the procedure and HOLD 48 HOURS AFTER THE PROCEDURE.  On the morning of your procedure, take your Aspirin and any morning medicines NOT listed above.  You  may use sips of water.  5. Plan for one night stay--bring personal belongings. 6. Bring a current list of your medications and current insurance cards. 7. You MUST have a responsible person to drive you home. 8. Someone MUST be with you the first 24 hours after you arrive home or your discharge will be delayed. 9. Please wear clothes that are easy to get on and off and wear slip-on shoes.  Thank you for allowing [place] to care for you!   -- [facility]    Follow-Up: At [facility], you and your health needs are our priority.  As part of our continuing mission to provide you with exceptional heart care, we have created designated Provider Care Teams.  These Care Teams include your primary Cardiologist (physician) and Advanced Practice Providers (APPs -  Physician Assistants and Nurse Practitioners) who all work together to provide you with the care you need, when you need it.  We recommend signing up for the patient portal called "MyChart".  Sign up information is provided on this After Visit Summary.  MyChart is used to connect with patients for Virtual Visits (Telemedicine).  Patients are able to view lab/test results, encounter notes, upcoming appointments, etc.  Non-urgent messages can be sent to your provider as well.   To learn more about what you can do with MyChart, go to [email or web address].    Your next appointment:   1 month(s)  The format for your next appointment:   In Person  Provider:   Belva Crome, MD  Other Instructions Nitroglycerin sublingual tablets What is this medicine? NITROGLYCERIN (nye troe GLI ser in) is a type of vasodilator. It relaxes blood vessels, increasing the blood and oxygen supply to your heart. This medicine is used to relieve chest pain caused by angina. It is also used to prevent chest pain before activities like climbing stairs, going outdoors in cold weather, or sexual activity. This medicine may be used  for other purposes; ask your health care provider or pharmacist if you have questions. COMMON BRAND NAME(S): Nitroquick, Nitrostat, Nitrotab What should I tell my health care provider before I take this medicine? They need to know if you have any of these conditions:  anemia  head injury, recent stroke, or bleeding in the brain  liver disease  previous heart attack  an unusual or allergic reaction to nitroglycerin, other medicines, foods, dyes, or preservatives  pregnant or trying to get pregnant  breast-feeding How should I use this medicine? Take this medicine by mouth as needed. At the first sign of an angina attack (chest pain or tightness) place one tablet under your tongue. You can also take this medicine 5 to 10 minutes before an event likely to produce chest pain. Follow the directions on the prescription label. Let the tablet dissolve under the tongue. Do not swallow whole. Replace the dose if you accidentally swallow it. It will help if your mouth is not dry. Saliva around the tablet will help it to dissolve more quickly. Do not eat or drink, smoke or chew tobacco while a tablet is dissolving. If you are not better within 5 minutes after taking ONE dose of nitroglycerin, call 9-1-1 immediately to seek emergency medical care. Do not take more than 3 nitroglycerin tablets over 15 minutes. If you take this medicine often to relieve symptoms of angina, your doctor or health care professional may provide you with different instructions to manage your symptoms. If symptoms do not go away after following these instructions, it is important to call 9-1-1 immediately. Do not take more than 3 nitroglycerin tablets over 15 minutes. Talk to your pediatrician regarding the use of this medicine in children. Special care may be needed. Overdosage: If you think you have taken too much of this medicine contact a poison control center or emergency room at once. NOTE: This medicine is only for you. Do  not share this medicine with others. What if I miss a dose? This does not apply. This medicine is only used as needed. What may interact with this medicine? Do not take this medicine with any of the following medications:  certain migraine medicines like ergotamine and dihydroergotamine (DHE)  medicines used to treat erectile dysfunction like sildenafil, tadalafil, and vardenafil  riociguat This medicine may also interact with the following medications:  alteplase  aspirin  heparin  medicines for high blood pressure  medicines for mental depression  other medicines used to treat angina  phenothiazines like chlorpromazine, mesoridazine, prochlorperazine, thioridazine This list may not describe all possible interactions. Give your health care provider a list of all the medicines, herbs, non-prescription drugs, or dietary supplements you use. Also tell them if you smoke, drink alcohol, or use illegal drugs. Some items may interact with your medicine. What should I watch for while using this medicine? Tell your doctor or health care professional if you feel your medicine is no longer working. Keep this medicine with you at all times. Sit or lie down when you take your medicine to prevent falling if you feel dizzy  or faint after using it. Try to remain calm. This will help you to feel better faster. If you feel dizzy, take several deep breaths and lie down with your feet propped up, or bend forward with your head resting between your knees. You may get drowsy or dizzy. Do not drive, use machinery, or do anything that needs mental alertness until you know how this drug affects you. Do not stand or sit up quickly, especially if you are an older patient. This reduces the risk of dizzy or fainting spells. Alcohol can make you more drowsy and dizzy. Avoid alcoholic drinks. Do not treat yourself for coughs, colds, or pain while you are taking this medicine without asking your doctor or health  care professional for advice. Some ingredients may increase your blood pressure. What side effects may I notice from receiving this medicine? Side effects that you should report to your doctor or health care professional as soon as possible:  blurred vision  dry mouth  skin rash  sweating  the feeling of extreme pressure in the head  unusually weak or tired Side effects that usually do not require medical attention (report to your doctor or health care professional if they continue or are bothersome):  flushing of the face or neck  headache  irregular heartbeat, palpitations  nausea, vomiting This list may not describe all possible side effects. Call your doctor for medical advice about side effects. You may report side effects to FDA at 1-800-FDA-1088. Where should I keep my medicine? Keep out of the reach of children. Store at room temperature between 20 and 25 degrees C (68 and 77 degrees F). Store in Retail buyer. Protect from light and moisture. Keep tightly closed. Throw away any unused medicine after the expiration date. NOTE: This sheet is a summary. It may not cover all possible information. If you have questions about this medicine, talk to your doctor, pharmacist, or health care provider.  2020 Elsevier/Gold Standard (2013-01-30 17:57:36)  Coronary Angioplasty  Coronary angioplasty is a procedure to widen a narrowed or blocked blood vessel of the heart (coronary artery). The artery is usually blocked by cholesterol buildup (plaques) in the lining of the artery walls. When a vessel in the heart becomes partially blocked, there is decreased blood flow to that area. This may lead to chest pain or a heart attack (myocardial infarction). Tell a health care provider about:  Any allergies you have, including allergies to shellfish or contrast dye.  All medicines you are taking, including vitamins, herbs, eye drops, creams, and over-the-counter medicines.  Any  problems you or family members have had with anesthetic medicines.  Any blood disorders you have.  Any surgeries you have had.  Any medical conditions you have.  Whether you are pregnant or may be pregnant. What are the risks? Generally, this is a safe procedure. However, problems may occur, including:  Damage to other structures or organs. This may include damage to blood vessels, leading to rupture or bleeding.  Infection, bleeding, or bruising at the site where a small, thin tube (catheter) will be inserted.  Allergic reaction to the dye or contrast that is used.  Kidney damage from the dye or contrast that is used.  Blood clots that can lead to a stroke or heart attack.  Bleeding into the abdomen (retroperitoneal bleeding). What happens before the procedure? Staying hydrated Follow instructions from your health care provider about hydration, which may include:  Up to 2 hours before the procedure - you may  continue to drink clear liquids, such as water, clear fruit juice, black coffee, and plain tea. Eating and drinking restrictions Follow instructions from your health care provider about eating and drinking, which may include:  8 hours before the procedure - stop eating heavy meals or foods such as meat, fried foods, or fatty foods.  6 hours before the procedure - stop eating light meals or foods, such as toast or cereal.  2 hours before the procedure - stop drinking clear liquids. Medicines  Ask your health care provider about: ? Changing or stopping your regular medicines. This is especially important if you are taking diabetes medicines or blood thinners. ? Whether aspirin is recommended before this procedure.  Ask your health care provider if you can take a sip of water with any approved medicines the morning of the procedure. General instructions  Plan to have someone take you home from the hospital or clinic.  If you will be going home right after the  procedure, plan to have someone with you for 24 hours. What happens during the procedure?  To reduce your risk of infection: ? Your health care team will wash or sanitize their hands. ? A germ-killing solution (antiseptic) will be used to wash the area where the catheter will be inserted. Hair may be removed from this area. The catheter may be inserted in:  Your groin area. This is the most common area.  The fold of your arm, near your elbow.  Your wrist.  An IV tube will be inserted into one of your veins.  You will be given a medicine to help you relax (sedative).  You will be given a medicine to numb the area where the catheter will be inserted (local anesthetic).  The catheter will be inserted into an artery.  The catheter will be guided to the narrowed or blocked artery using a type of X-ray (fluoroscopy).  When the catheter is near the heart, dye will be injected that makes the narrowing or blockage visible on the X-ray.  Once the catheter is positioned at the narrowed or blocked portion of the blood vessel, a balloon will be inflated to make the artery wider. Expanding the balloon will crush the plaques into the wall of the vessel and improve the blood flow.  The artery may be made wider by removing plaques using a drill, laser, or other tools.  When the blood flow is better, the balloon will be deflated and the catheter will be removed.  A stent may be placed. This is common in this procedure.  After the catheter is removed, a special dressing will be placed over the insertion site. What happens after the procedure?  You will need to keep the area still for a few hours, or as long as directed by your health care provider. If the procedure was done in the groin, you will be instructed not to bend or cross your legs.  The insertion site will be checked often.  The pulse in your feet or wrist will be checked often.  Additional blood tests, X-rays, and an  electrocardiogram (ECG) may be done.  Do not drive for 24 hours if you were given a sedative. This information is not intended to replace advice given to you by your health care provider. Make sure you discuss any questions you have with your health care provider. Document Revised: 03/16/2017 Document Reviewed: 12/31/2015 Elsevier Patient Education  2020 [employer].

## 2020-03-20 LAB — BASIC METABOLIC PANEL
BUN/Creatinine Ratio: 15 (ref 12–28)
BUN: 13 mg/dL (ref 8–27)
CO2: 23 mmol/L (ref 20–29)
Calcium: 9.2 mg/dL (ref 8.7–10.3)
Chloride: 99 mmol/L (ref 96–106)
Creatinine, Ser: 0.88 mg/dL (ref 0.57–1.00)
GFR calc Af Amer: 78 mL/min/{1.73_m2} (ref >59)
GFR calc non Af Amer: 67 mL/min/{1.73_m2} (ref >59)
Glucose: 119 mg/dL — ABNORMAL HIGH (ref 65–99)
Potassium: 3.7 mmol/L (ref 3.5–5.2)
Sodium: 143 mmol/L (ref 134–144)

## 2020-03-20 LAB — CBC WITH DIFFERENTIAL/PLATELET
Basophils Absolute: 0.1 10*3/uL (ref 0.0–0.2)
Basos: 1 %
EOS (ABSOLUTE): 0 10*3/uL (ref 0.0–0.4)
Eos: 1 %
Hematocrit: 53.3 % — ABNORMAL HIGH (ref 34.0–46.6)
Hemoglobin: 18.1 g/dL — ABNORMAL HIGH (ref 11.1–15.9)
Immature Grans (Abs): 0 10*3/uL (ref 0.0–0.1)
Immature Granulocytes: 0 %
Lymphocytes Absolute: 0.8 10*3/uL (ref 0.7–3.1)
Lymphs: 12 %
MCH: 31.6 pg (ref 26.6–33.0)
MCHC: 34 g/dL (ref 31.5–35.7)
MCV: 93 fL (ref 79–97)
Monocytes Absolute: 0.4 10*3/uL (ref 0.1–0.9)
Monocytes: 6 %
Neutrophils Absolute: 5.5 10*3/uL (ref 1.4–7.0)
Neutrophils: 80 %
Platelets: 186 10*3/uL (ref 150–450)
RBC: 5.72 x10E6/uL — ABNORMAL HIGH (ref 3.77–5.28)
RDW: 12.4 % (ref 11.7–15.4)
WBC: 6.9 10*3/uL (ref 3.4–10.8)

## 2020-03-25 ENCOUNTER — Telehealth: Payer: Self-pay | Admitting: *Deleted

## 2020-03-25 NOTE — Telephone Encounter (Signed)
Pt contacted pre-catheterization scheduled at [facility] for: Monday March 29, 2020 7:30 AM Verified arrival time and place: [facility]) at: 5:30 AM   No solid food after midnight prior to cath, clear liquids until 5 AM day of procedure.  Hold: Lasix-AM of procedure  Metformin-day of procedure and 48 hours post procedure  Except hold medications AM meds can be  taken pre-cath with sips of water including: ASA 81 mg   Confirmed patient has responsible adult to drive home post procedure and be with patient first 24 hours after arriving home: yes  You are allowed ONE visitor in the waiting room during the time you are at the hospital for your procedure. Both you and your visitor must wear a mask once you enter the hospital.       COVID-19 Pre-Screening Questions:  . In the past 14 days have you had any symptoms concerning for COVID-19 infection (fever, chills, cough, or new shortness of breath)? no . In the past 14 days have you been around anyone with known Covid 19? No  Reviewed procedure/mask/visitor instructions, COVID-19 questions reviewed with patient.

## 2020-03-27 ENCOUNTER — Other Ambulatory Visit (HOSPITAL_COMMUNITY)
Admission: RE | Admit: 2020-03-27 | Discharge: 2020-03-27 | Disposition: A | Discharge status: Home / Self Care | Payer: Medicare Other | Source: Ambulatory Visit | Attending: Cardiology | Admitting: Cardiology

## 2020-03-27 DIAGNOSIS — Z20822 Contact with and (suspected) exposure to covid-19: Secondary | ICD-10-CM | POA: Insufficient documentation

## 2020-03-27 DIAGNOSIS — Z01812 Encounter for preprocedural laboratory examination: Secondary | ICD-10-CM | POA: Insufficient documentation

## 2020-03-28 LAB — SARS CORONAVIRUS 2 (TAT 6-24 HRS): SARS Coronavirus 2: NEGATIVE

## 2020-03-29 ENCOUNTER — Encounter (HOSPITAL_COMMUNITY): Payer: Self-pay | Admitting: Cardiology

## 2020-03-29 ENCOUNTER — Other Ambulatory Visit: Payer: Self-pay

## 2020-03-29 ENCOUNTER — Ambulatory Visit (HOSPITAL_COMMUNITY)
Admission: RE | Admit: 2020-03-29 | Discharge: 2020-03-29 | Disposition: A | Discharge status: Home / Self Care | Payer: Medicare Other | Attending: Cardiology | Admitting: Cardiology

## 2020-03-29 ENCOUNTER — Ambulatory Visit (HOSPITAL_COMMUNITY)
Admission: RE | Disposition: A | Discharge status: Home / Self Care | Payer: Self-pay | Source: Home / Self Care | Attending: Cardiology

## 2020-03-29 DIAGNOSIS — I110 Hypertensive heart disease with heart failure: Secondary | ICD-10-CM | POA: Diagnosis not present

## 2020-03-29 DIAGNOSIS — I428 Other cardiomyopathies: Secondary | ICD-10-CM | POA: Diagnosis not present

## 2020-03-29 DIAGNOSIS — F17210 Nicotine dependence, cigarettes, uncomplicated: Secondary | ICD-10-CM | POA: Insufficient documentation

## 2020-03-29 DIAGNOSIS — R0609 Other forms of dyspnea: Secondary | ICD-10-CM | POA: Diagnosis not present

## 2020-03-29 DIAGNOSIS — R9439 Abnormal result of other cardiovascular function study: Secondary | ICD-10-CM | POA: Insufficient documentation

## 2020-03-29 DIAGNOSIS — E782 Mixed hyperlipidemia: Secondary | ICD-10-CM | POA: Diagnosis not present

## 2020-03-29 DIAGNOSIS — I5041 Acute combined systolic (congestive) and diastolic (congestive) heart failure: Secondary | ICD-10-CM | POA: Diagnosis present

## 2020-03-29 DIAGNOSIS — Z7984 Long term (current) use of oral hypoglycemic drugs: Secondary | ICD-10-CM | POA: Insufficient documentation

## 2020-03-29 DIAGNOSIS — Z720 Tobacco use: Secondary | ICD-10-CM

## 2020-03-29 DIAGNOSIS — Z79899 Other long term (current) drug therapy: Secondary | ICD-10-CM | POA: Insufficient documentation

## 2020-03-29 DIAGNOSIS — Z7982 Long term (current) use of aspirin: Secondary | ICD-10-CM | POA: Insufficient documentation

## 2020-03-29 DIAGNOSIS — I209 Angina pectoris, unspecified: Secondary | ICD-10-CM

## 2020-03-29 DIAGNOSIS — E088 Diabetes mellitus due to underlying condition with unspecified complications: Secondary | ICD-10-CM

## 2020-03-29 DIAGNOSIS — E119 Type 2 diabetes mellitus without complications: Secondary | ICD-10-CM | POA: Diagnosis not present

## 2020-03-29 DIAGNOSIS — I509 Heart failure, unspecified: Secondary | ICD-10-CM | POA: Insufficient documentation

## 2020-03-29 DIAGNOSIS — I5022 Chronic systolic (congestive) heart failure: Secondary | ICD-10-CM

## 2020-03-29 HISTORY — DX: Abnormal result of other cardiovascular function study: R94.39

## 2020-03-29 HISTORY — PX: LEFT HEART CATH AND CORONARY ANGIOGRAPHY: CATH118249

## 2020-03-29 LAB — GLUCOSE, CAPILLARY: Glucose-Capillary: 147 mg/dL — ABNORMAL HIGH (ref 70–99)

## 2020-03-29 SURGERY — LEFT HEART CATH AND CORONARY ANGIOGRAPHY
Anesthesia: LOCAL

## 2020-03-29 MED ORDER — LIDOCAINE HCL (PF) 1 % IJ SOLN
INTRAMUSCULAR | Status: AC
  Filled 2020-03-29: qty 30

## 2020-03-29 MED ORDER — ACETAMINOPHEN 325 MG PO TABS: 650.0000 mg | ORAL_TABLET | ORAL | Status: DC | PRN

## 2020-03-29 MED ORDER — SODIUM CHLORIDE 0.9% FLUSH: 3.0000 mL | Freq: Two times a day (BID) | INTRAVENOUS | Status: DC

## 2020-03-29 MED ORDER — FUROSEMIDE 10 MG/ML IJ SOLN
INTRAMUSCULAR | Status: AC
  Filled 2020-03-29: qty 2

## 2020-03-29 MED ORDER — VERAPAMIL HCL 2.5 MG/ML IV SOLN
INTRAVENOUS | Status: AC
  Filled 2020-03-29: qty 2

## 2020-03-29 MED ORDER — LIDOCAINE HCL (PF) 1 % IJ SOLN
INTRAMUSCULAR | Status: DC | PRN
  Administered 2020-03-29: 2 mL

## 2020-03-29 MED ORDER — HYDRALAZINE HCL 20 MG/ML IJ SOLN: 10.0000 mg | INTRAMUSCULAR | Status: DC | PRN

## 2020-03-29 MED ORDER — SODIUM CHLORIDE 0.9 % IV SOLN: INTRAVENOUS | Status: DC

## 2020-03-29 MED ORDER — HEPARIN SODIUM (PORCINE) 1000 UNIT/ML IJ SOLN
INTRAMUSCULAR | Status: DC | PRN
  Administered 2020-03-29: 3500 [IU] via INTRAVENOUS

## 2020-03-29 MED ORDER — LISINOPRIL 2.5 MG PO TABS
5.0000 mg | ORAL_TABLET | Freq: Every day | ORAL | 1 refills | Status: DC
Start: 2020-03-29 — End: 2020-04-21

## 2020-03-29 MED ORDER — HEPARIN (PORCINE) IN NACL 1000-0.9 UT/500ML-% IV SOLN
INTRAVENOUS | Status: DC | PRN
  Administered 2020-03-29 (×2): 500 mL

## 2020-03-29 MED ORDER — HEPARIN (PORCINE) IN NACL 1000-0.9 UT/500ML-% IV SOLN
INTRAVENOUS | Status: AC
  Filled 2020-03-29: qty 1000

## 2020-03-29 MED ORDER — SODIUM CHLORIDE 0.9 % WEIGHT BASED INFUSION: 3.0000 mL/kg/h | INTRAVENOUS | Status: DC

## 2020-03-29 MED ORDER — LABETALOL HCL 5 MG/ML IV SOLN: 10.0000 mg | INTRAVENOUS | Status: DC | PRN

## 2020-03-29 MED ORDER — SODIUM CHLORIDE 0.9 % WEIGHT BASED INFUSION: 1.0000 mL/kg/h | INTRAVENOUS | Status: DC

## 2020-03-29 MED ORDER — VERAPAMIL HCL 2.5 MG/ML IV SOLN
INTRAVENOUS | Status: DC | PRN
  Administered 2020-03-29: 08:00:00 10 mL via INTRA_ARTERIAL

## 2020-03-29 MED ORDER — IOHEXOL 350 MG/ML SOLN
INTRAVENOUS | Status: DC | PRN
  Administered 2020-03-29: 08:00:00 90 mL

## 2020-03-29 MED ORDER — SODIUM CHLORIDE 0.9 % IV SOLN: 250.0000 mL | INTRAVENOUS | Status: DC | PRN

## 2020-03-29 MED ORDER — FENTANYL CITRATE (PF) 100 MCG/2ML IJ SOLN
INTRAMUSCULAR | Status: AC
  Filled 2020-03-29: qty 2

## 2020-03-29 MED ORDER — MIDAZOLAM HCL 2 MG/2ML IJ SOLN
INTRAMUSCULAR | Status: DC | PRN
  Administered 2020-03-29 (×2): 1 mg via INTRAVENOUS

## 2020-03-29 MED ORDER — FENTANYL CITRATE (PF) 100 MCG/2ML IJ SOLN
INTRAMUSCULAR | Status: DC | PRN
  Administered 2020-03-29: 25 ug via INTRAVENOUS

## 2020-03-29 MED ORDER — ASPIRIN 81 MG PO CHEW: 81.0000 mg | CHEWABLE_TABLET | ORAL | Status: DC

## 2020-03-29 MED ORDER — SODIUM CHLORIDE 0.9% FLUSH: 3.0000 mL | INTRAVENOUS | Status: DC | PRN

## 2020-03-29 MED ORDER — MIDAZOLAM HCL 2 MG/2ML IJ SOLN
INTRAMUSCULAR | Status: AC
  Filled 2020-03-29: qty 2

## 2020-03-29 MED ORDER — HEPARIN SODIUM (PORCINE) 1000 UNIT/ML IJ SOLN
INTRAMUSCULAR | Status: AC
  Filled 2020-03-29: qty 1

## 2020-03-29 MED ORDER — ONDANSETRON HCL 4 MG/2ML IJ SOLN: 4.0000 mg | Freq: Four times a day (QID) | INTRAMUSCULAR | Status: DC | PRN

## 2020-03-29 MED ORDER — FUROSEMIDE 10 MG/ML IJ SOLN
20.0000 mg | Freq: Once | INTRAMUSCULAR | Status: AC
  Administered 2020-03-29: 09:00:00 20 mg via INTRAVENOUS

## 2020-03-29 SURGICAL SUPPLY — 13 items
CATH INFINITI 5FR ANG PIGTAIL (CATHETERS) ×1 IMPLANT
CATH OPTITORQUE TIG 4.0 5F (CATHETERS) ×1 IMPLANT
DEVICE RAD COMP TR BAND LRG (VASCULAR PRODUCTS) ×1 IMPLANT
GLIDESHEATH SLEND SS 6F .021 (SHEATH) ×1 IMPLANT
GUIDEWIRE INQWIRE 1.5J.035X260 (WIRE) IMPLANT
INQWIRE 1.5J .035X260CM (WIRE) ×2
KIT HEART LEFT (KITS) ×2 IMPLANT
PACK CARDIAC CATHETERIZATION (CUSTOM PROCEDURE TRAY) ×2 IMPLANT
SHEATH PROBE COVER 6X72 (BAG) ×1 IMPLANT
SYR MEDRAD MARK 7 150ML (SYRINGE) ×2 IMPLANT
TRANSDUCER W/STOPCOCK (MISCELLANEOUS) ×2 IMPLANT
TUBING CIL FLEX 10 FLL-RA (TUBING) ×2 IMPLANT
WIRE HI TORQ VERSACORE-J 145CM (WIRE) ×1 IMPLANT

## 2020-03-29 NOTE — Discharge Instructions (Signed)
Hold Metformin for 48 hours     Radial Site Care  This sheet gives you information about how to care for yourself after your procedure. Your health care provider may also give you more specific instructions. If you have problems or questions, contact your health care provider. What can I expect after the procedure? After the procedure, it is common to have:  Bruising and tenderness at the catheter insertion area. Follow these instructions at home: Medicines  Take over-the-counter and prescription medicines only as told by your health care provider. Insertion site care  Follow instructions from your health care provider about how to take care of your insertion site. Make sure you: ? Wash your hands with soap and water before you change your bandage (dressing). If soap and water are not available, use hand sanitizer. ? Change your dressing as told by your health care provider. ? Leave stitches (sutures), skin glue, or adhesive strips in place. These skin closures may need to stay in place for 2 weeks or longer. If adhesive strip edges start to loosen and curl up, you may trim the loose edges. Do not remove adhesive strips completely unless your health care provider tells you to do that.  Check your insertion site every day for signs of infection. Check for: ? Redness, swelling, or pain. ? Fluid or blood. ? Pus or a bad smell. ? Warmth.  Do not take baths, swim, or use a hot tub until your health care provider approves.  You may shower 24-48 hours after the procedure, or as directed by your health care provider. ? Remove the dressing and gently wash the site with plain soap and water. ? Pat the area dry with a clean towel. ? Do not rub the site. That could cause bleeding.  Do not apply powder or lotion to the site. Activity   For 24 hours after the procedure, or as directed by your health care provider: ? Do not flex or bend the affected arm. ? Do not push or pull heavy objects with  the affected arm. ? Do not drive yourself home from the hospital or clinic. You may drive 24 hours after the procedure unless your health care provider tells you not to. ? Do not operate machinery or power tools.  Do not lift anything that is heavier than 10 lb (4.5 kg), or the limit that you are told, until your health care provider says that it is safe.  Ask your health care provider when it is okay to: ? Return to work or school. ? Resume usual physical activities or sports. ? Resume sexual activity. General instructions  If the catheter site starts to bleed, raise your arm and put firm pressure on the site. If the bleeding does not stop, get help right away. This is a medical emergency.  If you went home on the same day as your procedure, a responsible adult should be with you for the first 24 hours after you arrive home.  Keep all follow-up visits as told by your health care provider. This is important. Contact a health care provider if:  You have a fever.  You have redness, swelling, or yellow drainage around your insertion site. Get help right away if:  You have unusual pain at the radial site.  The catheter insertion area swells very fast.  The insertion area is bleeding, and the bleeding does not stop when you hold steady pressure on the area.  Your arm or hand becomes pale, cool, tingly,  or numb. These symptoms may represent a serious problem that is an emergency. Do not wait to see if the symptoms will go away. Get medical help right away. Call your local emergency services (911 in the [place] Do not drive yourself to the hospital. Summary  After the procedure, it is common to have bruising and tenderness at the site.  Follow instructions from your health care provider about how to take care of your radial site wound. Check the wound every day for signs of infection.  Do not lift anything that is heavier than 10 lb (4.5 kg), or the limit that you are told, until your  health care provider says that it is safe. This information is not intended to replace advice given to you by your health care provider. Make sure you discuss any questions you have with your health care provider. Document Revised: 05/09/2017 Document Reviewed: 05/09/2017 Elsevier Patient Education  2020 [employer].

## 2020-03-29 NOTE — Interval H&P Note (Signed)
History and Physical Interval Note:  03/29/2020 7:46 AM  [name]  has presented today for surgery, with the diagnosis of abnormal lexiscan myoview, acute combined systolic and diastolic HCF.  The various methods of treatment have been discussed with the patient and family. After consideration of risks, benefits and other options for treatment, the patient has consented to  Procedure(s): LEFT HEART CATH AND CORONARY ANGIOGRAPHY (N/A)  PERCUTANEOUS CORONARY INTERVENTION  as a surgical intervention.  The patient's history has been reviewed, patient examined, no change in status, stable for surgery.  I have reviewed the patient's chart and labs.  Questions were answered to the patient's satisfaction.     Cath Lab Visit (complete for each Cath Lab visit)  Clinical Evaluation Leading to the Procedure:   ACS: No.  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

## 2020-03-30 MED FILL — Lidocaine HCl Local Preservative Free (PF) Inj 1%: INTRAMUSCULAR | Qty: 30 | Status: AC

## 2020-04-08 ENCOUNTER — Other Ambulatory Visit: Payer: Self-pay

## 2020-04-08 ENCOUNTER — Ambulatory Visit (INDEPENDENT_AMBULATORY_CARE_PROVIDER_SITE_OTHER): Payer: Medicare Other

## 2020-04-08 DIAGNOSIS — R0609 Other forms of dyspnea: Secondary | ICD-10-CM

## 2020-04-08 DIAGNOSIS — I509 Heart failure, unspecified: Secondary | ICD-10-CM | POA: Diagnosis not present

## 2020-04-08 DIAGNOSIS — R0600 Dyspnea, unspecified: Secondary | ICD-10-CM

## 2020-04-08 LAB — ECHOCARDIOGRAM COMPLETE
Area-P 1/2: 4.8 cm2
Calc EF: 24 %
S' Lateral: 5.5 cm
Single Plane A2C EF: 24.2 %
Single Plane A4C EF: 23.3 %

## 2020-04-08 NOTE — Progress Notes (Signed)
Complete echocardiogram performed.  Jimmy Divine Hansley RDCS, RVT  

## 2020-04-13 ENCOUNTER — Telehealth: Payer: Self-pay

## 2020-04-13 NOTE — Telephone Encounter (Signed)
-----   Message from Garwin Brothers, MD sent at 04/12/2020  3:57 PM EST ----- Needs elective appointment to discuss in the next couple of weeks. Garwin Brothers, MD 04/12/2020 3:57 PM

## 2020-04-13 NOTE — Telephone Encounter (Signed)
Pt is returning call.  

## 2020-04-13 NOTE — Telephone Encounter (Signed)
Spoke with patient regarding results and recommendation.  Patient verbalizes understanding and is agreeable to plan of care. Advised patient to call back with any issues or concerns.  

## 2020-04-13 NOTE — Telephone Encounter (Signed)
Left message on patients voicemail to please return our call.   

## 2020-04-21 ENCOUNTER — Encounter: Payer: Self-pay | Admitting: Cardiology

## 2020-04-21 ENCOUNTER — Other Ambulatory Visit: Payer: Self-pay

## 2020-04-21 ENCOUNTER — Ambulatory Visit (INDEPENDENT_AMBULATORY_CARE_PROVIDER_SITE_OTHER): Payer: Medicare Other | Admitting: Cardiology

## 2020-04-21 VITALS — BP 118/78 | HR 74 | Ht 64.0 in | Wt 139.2 lb

## 2020-04-21 DIAGNOSIS — E088 Diabetes mellitus due to underlying condition with unspecified complications: Secondary | ICD-10-CM

## 2020-04-21 DIAGNOSIS — E782 Mixed hyperlipidemia: Secondary | ICD-10-CM

## 2020-04-21 DIAGNOSIS — Z720 Tobacco use: Secondary | ICD-10-CM

## 2020-04-21 DIAGNOSIS — I2510 Atherosclerotic heart disease of native coronary artery without angina pectoris: Secondary | ICD-10-CM

## 2020-04-21 DIAGNOSIS — I428 Other cardiomyopathies: Secondary | ICD-10-CM

## 2020-04-21 HISTORY — DX: Atherosclerotic heart disease of native coronary artery without angina pectoris: I25.10

## 2020-04-21 HISTORY — DX: Other cardiomyopathies: I42.8

## 2020-04-21 MED ORDER — FUROSEMIDE 40 MG PO TABS: 40.0000 mg | ORAL_TABLET | ORAL | 3 refills | Status: DC

## 2020-04-21 MED ORDER — FUROSEMIDE 20 MG PO TABS: ORAL_TABLET | ORAL | 3 refills | Status: DC

## 2020-04-21 MED ORDER — ENTRESTO 24-26 MG PO TABS: 1.0000 | ORAL_TABLET | Freq: Two times a day (BID) | ORAL | 1 refills | Status: DC

## 2020-04-21 NOTE — Patient Instructions (Addendum)
Medication Instructions:  Your physician has recommended you make the following change in your medication: 1) INCREASE Lasix (furosemide) to 20 mg every other day alternating with 40 mg every other day  2) STOP taking amlodipine - take your last dose on Friday 01/07 3) STOP taking lisinopril today  4) START taking Entresto 24-26 mg on Saturday 01/08  *If you need a refill on your cardiac medications before your next appointment, please call your pharmacy*   Lab Work: TODAY: BMET IN TWO WEEKS: BMET   If you have labs (blood work) drawn today and your tests are completely normal, you will receive your results only by: Marland Kitchen MyChart Message (if you have MyChart) OR . A paper copy in the mail If you have any lab test that is abnormal or we need to change your treatment, we will call you to review the results.   Follow-Up: At [facility], you and your health needs are our priority.  As part of our continuing mission to provide you with exceptional heart care, we have created designated Provider Care Teams.  These Care Teams include your primary Cardiologist (physician) and Advanced Practice Providers (APPs -  Physician Assistants and Nurse Practitioners) who all work together to provide you with the care you need, when you need it.  Your next appointment:   1 month(s)  The format for your next appointment:   In Person  Provider:   Belva Crome, MD

## 2020-04-21 NOTE — Addendum Note (Signed)
Addended by: Theresia Majors on: 04/21/2020 02:39 PM   Modules accepted: Orders

## 2020-04-21 NOTE — Progress Notes (Signed)
Cardiology Office Note:    Date:  04/21/2020   ID:  [name], DOB Aug 19, 1950, MRN [id number]  PCP:  [name], MD  Cardiologist:  Garwin Brothers, MD   Referring MD: [name], MD    ASSESSMENT:    1. Mixed hyperlipidemia   2. Diabetes mellitus due to underlying condition with unspecified complications (HCC)   3. Coronary artery disease involving native coronary artery of native heart without angina pectoris   4. Nonischemic cardiomyopathy (HCC)   5. Tobacco abuse    PLAN:    In order of problems listed above:  1. Advanced cardiomyopathy: I discussed my findings with the patient at length.  Coronary angiography findings were discussed.  This is nonischemic cardiomyopathy.  I made following recommendations to the patient.  I recommended LifeVest.  Benefits and risks explained she vocalized understanding she is not keen on it.  I told her the possibility of sudden cardiac death and she understood.  In view of her advanced cardiomyopathy which is nonischemic following recommendations were made.  Because of pedal edema she will have her Lasix increased to doubling her dose every other day.  She will have a Chem-7 today.  I asked her to stop her lisinopril.  She will take her amlodipine up to Friday and then not take her dose on Saturday.  On Saturday she will take low-dose Entresto every day and come back in a week for a Chem-7.  Benefits and potential risks of medications were explained and she vocalized understanding.  Questions were answered to her satisfaction. 2. Essential hypertension: Blood pressure stable.  I am discontinuing amlodipine so we have room for Entresto.  She understands. 3. Mixed dyslipidemia: Diet was emphasized.  She plans to do better. 4. Cigarette smoking: I spent 5 minutes with the patient discussing solely about smoking. Smoking cessation was counseled. I suggested to the patient also different medications and pharmacological interventions.  Patient is keen to try stopping on its own at this time. He will get back to me if he needs any further assistance in this matter. 5. Coronary artery disease: As mentioned above 6. Patient will be seen in follow-up appointment in 4 weeks or earlier if the patient has any concerns    Medication Adjustments/Labs and Tests Ordered: Current medicines are reviewed at length with the patient today.  Concerns regarding medicines are outlined above.  No orders of the defined types were placed in this encounter.  No orders of the defined types were placed in this encounter.    No chief complaint on file.    History of Present Illness:    [name] is a 70 y.o. female.  Patient has past medical history of essential hypertension dyslipidemia and smoking.  She had abnormal stress test and cardiomyopathy for which she underwent coronary angiography.  This revealed nonobstructive disease management was recommended.  Patient denies any problems at this time except bilateral pedal edema.  When she exerts she has shortness of breath.  At the time of my evaluation, the patient is alert awake oriented and in no distress.  Past Medical History:  Diagnosis Date  . Abnormal nuclear stress test 03/29/2020  . Acute combined systolic and diastolic heart failure (HCC) 03/09/2020  . Atypical Angina pectoris (HCC) 03/19/2020  . Depression   . Diabetes mellitus due to underlying condition with unspecified complications (HCC) 03/09/2020  . Diabetes mellitus, type 2 (HCC)   . DVT (deep venous thrombosis) (HCC) 07/2011  S/P coumadin x 6 months  . Dyspnea on exertion 03/19/2020  . HTN (hypertension)   . Hyperlipidemia   . Hypertension   . Hypokalemia 05/08/2012  . Hypotension 05/08/2012  . Iron deficiency anemia   . Iron deficiency anemia, unspecified, symptomatic with dyspnea, dizziness, and shortness of breath with activity 05/08/2012  . Leukocytosis 05/08/2012  . Tobacco abuse 05/08/2012  . Wheezing     Recently put on albuterol, no history of asthma    Past Surgical History:  Procedure Laterality Date  . APPENDECTOMY    . Bilateral oophrectomy     Secondary to benign cysts  . colonoscopy     with polypectomy  . LEFT HEART CATH AND CORONARY ANGIOGRAPHY N/A 03/29/2020   Procedure: LEFT HEART CATH AND CORONARY ANGIOGRAPHY;  Surgeon: Leonie Man, MD;  Location: [place] [facility];  Service: Cardiovascular;  Laterality: N/A;  . TONSILLECTOMY      Current Medications: Current Meds  Medication Sig  . Aflibercept 2 MG/0.05ML SOSY Place 1 mg into both eyes every 6 (six) weeks.  Marland Kitchen amLODipine (NORVASC) 10 MG tablet Take 10 mg by mouth daily.  Marland Kitchen aspirin 81 MG EC tablet Take 81 mg by mouth every evening.   . carboxymethylcellulose (REFRESH PLUS) 0.5 % SOLN Place 1 drop into both eyes 3 (three) times daily as needed (dry eye).  . Ferrous Sulfate (IRON PO) Take 65 mg by mouth See admin instructions. 4-5 times a week  . furosemide (LASIX) 20 MG tablet Take 1 tablet (20 mg total) by mouth daily. (Patient taking differently: Take 20 mg by mouth every evening.)  . lisinopril (ZESTRIL) 2.5 MG tablet Take 2 tablets (5 mg total) by mouth daily.  . metFORMIN (GLUCOPHAGE-XR) 500 MG 24 hr tablet Take 500 mg by mouth every evening.   . Multiple Vitamin (MULTIVITAMIN) tablet Take 1 tablet by mouth daily. PreserVision Ired 2  . nitroGLYCERIN (NITROSTAT) 0.4 MG SL tablet Place 1 tablet (0.4 mg total) under the tongue every 5 (five) minutes as needed.  . simvastatin (ZOCOR) 20 MG tablet Take 20 mg by mouth every evening.      Allergies:   Patient has no known allergies.   Social History   Socioeconomic History  . Marital status: Married    Spouse name: Sabreen Kitchen  . Number of children: 2  . Years of education: Not on file  . Highest education level: Not on file  Occupational History  . Occupation: Banker for a company    Employer: [employer]  Tobacco Use  . Smoking status: Current Every Day  Smoker    Packs/day: 0.50    Years: 40.00    Pack years: 20.00    Types: Cigarettes  . Smokeless tobacco: Never Used  Substance and Sexual Activity  . Alcohol use: Yes    Alcohol/week: 15.0 standard drinks    Types: 15 Cans of beer per week  . Drug use: No  . Sexual activity: Never  Other Topics Concern  . Not on file  Social History Narrative   Married.  Lives in husband here in [place].  Normally independent of ADLs.   Social Determinants of Health   Financial Resource Strain: Not on file  Food Insecurity: Not on file  Transportation Needs: Not on file  Physical Activity: Not on file  Stress: Not on file  Social Connections: Not on file     Family History: The patient's family history includes Alcoholism in her father; Breast cancer in her mother;  CAD in her paternal grandmother; COPD in her mother; Diabetes in her maternal grandmother and paternal grandmother; Liver disease in her father; Stroke in her maternal grandfather and paternal grandfather; Uterine cancer in her mother.  ROS:   Please see the history of present illness.    All other systems reviewed and are negative.  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  There is severe left ventricular systolic dysfunction. The left ventricular ejection fraction is less than 25% by visual estimate.  LV end diastolic pressure is moderately elevated - 22 mmHg  There is no aortic valve stenosis.   SUMMARY  Angiographically Normal Coronary Arteries in the Left Coronary System -> very small caliber lateral OM 2 has an almost aneurysmal dilation followed by smooth tapering (would not explain abnormal stress test or reduced EF)  Severely reduced LV function with  moderately dilated left ventricle and moderate increased left ventricular pressures.  RECOMMENDATIONS  Discharge after bedrest  I will increase lisinopril dose to 5 mg, but would likely plan to convert to [facility] in the outpatient setting pending cost evaluation.  She is also not on a beta-blocker, but did have borderline bradycardia in the Cath Lab.  We will give 1 dose of IV Lasix to allow for post-cath hydration, then continue home diuretic.  She will need close follow-up with Dr. Tomie China for titration of Guideline Directed Medical Therapy for Nonischemic Cardiomyopathy    Bryan Lemma, MD  IMPRESSIONS    1. Left ventricular ejection fraction, by estimation, is 20 to 25%. The  left ventricle has severely decreased function. The left ventricle  demonstrates global hypokinesis. The left ventricular internal cavity size  was moderately dilated. There is mild  concentric left ventricular hypertrophy. Left ventricular diastolic  parameters are consistent with Grade III diastolic dysfunction  (restrictive). The average left ventricular global longitudinal strain is  -5.9 %. The global longitudinal strain is  abnormal.  2. Right ventricular systolic function is mildly reduced. The right  ventricular size is mildly enlarged. There is moderately elevated  pulmonary artery systolic pressure.  3. Left atrial size was severely dilated.  4. Right atrial size was severely dilated.  5. The mitral valve is normal in structure. Mild mitral valve  regurgitation. No evidence of mitral stenosis.  6. Tricuspid valve regurgitation is moderate.  7. The aortic valve is normal in structure. Aortic valve regurgitation is  not visualized. No aortic stenosis is present.  8. The inferior vena cava is normal in size with greater than 50%  respiratory variability, suggesting right atrial pressure of 3 mmHg.     Recent Labs: 03/19/2020: BUN 13; Creatinine, Ser 0.88; Hemoglobin 18.1;  Platelets 186; Potassium 3.7; Sodium 143  Recent Lipid Panel No results found for: CHOL, TRIG, HDL, CHOLHDL, VLDL, LDLCALC, LDLDIRECT  Physical Exam:    VS:  BP 118/78   Pulse 74   Ht 5\' 4"  (1.626 m)   Wt 139 lb 3.2 oz (63.1 kg)   SpO2 94%   BMI 23.89 kg/m     Wt Readings from Last 3 Encounters:  04/21/20 139 lb 3.2 oz (63.1 kg)  03/29/20 140 lb (63.5 kg)  03/19/20 141 lb 9.6 oz (64.2 kg)     GEN: Patient is in no acute distress HEENT:  Normal NECK: No JVD; No carotid bruits LYMPHATICS: No lymphadenopathy CARDIAC: Hear sounds regular, 2/6 systolic murmur at the apex. RESPIRATORY:  Clear to auscultation without rales, wheezing or rhonchi  ABDOMEN: Soft, non-tender, non-distended MUSCULOSKELETAL: Bilateral pedal edema; No deformity  SKIN: Warm and dry NEUROLOGIC:  Alert and oriented x 3 PSYCHIATRIC:  Normal affect   Signed, Garwin Brothers, MD  04/21/2020 2:14 PM    [facility]

## 2020-04-22 LAB — BASIC METABOLIC PANEL
BUN/Creatinine Ratio: 20 (ref 12–28)
BUN: 15 mg/dL (ref 8–27)
CO2: 22 mmol/L (ref 20–29)
Calcium: 9.3 mg/dL (ref 8.7–10.3)
Chloride: 105 mmol/L (ref 96–106)
Creatinine, Ser: 0.75 mg/dL (ref 0.57–1.00)
GFR calc Af Amer: 94 mL/min/{1.73_m2} (ref >59)
GFR calc non Af Amer: 82 mL/min/{1.73_m2} (ref >59)
Glucose: 107 mg/dL — ABNORMAL HIGH (ref 65–99)
Potassium: 3.9 mmol/L (ref 3.5–5.2)
Sodium: 143 mmol/L (ref 134–144)

## 2020-05-10 ENCOUNTER — Other Ambulatory Visit: Payer: Self-pay

## 2020-05-10 DIAGNOSIS — I2510 Atherosclerotic heart disease of native coronary artery without angina pectoris: Secondary | ICD-10-CM

## 2020-05-10 DIAGNOSIS — I428 Other cardiomyopathies: Secondary | ICD-10-CM

## 2020-05-11 LAB — BASIC METABOLIC PANEL
BUN/Creatinine Ratio: 17 (ref 12–28)
BUN: 16 mg/dL (ref 8–27)
CO2: 24 mmol/L (ref 20–29)
Calcium: 9.1 mg/dL (ref 8.7–10.3)
Chloride: 102 mmol/L (ref 96–106)
Creatinine, Ser: 0.95 mg/dL (ref 0.57–1.00)
GFR calc Af Amer: 71 mL/min/{1.73_m2} (ref >59)
GFR calc non Af Amer: 61 mL/min/{1.73_m2} (ref >59)
Glucose: 130 mg/dL — ABNORMAL HIGH (ref 65–99)
Potassium: 3.8 mmol/L (ref 3.5–5.2)
Sodium: 144 mmol/L (ref 134–144)

## 2020-05-20 ENCOUNTER — Other Ambulatory Visit: Payer: Self-pay

## 2020-05-26 ENCOUNTER — Encounter: Payer: Self-pay | Admitting: Cardiology

## 2020-05-26 ENCOUNTER — Ambulatory Visit (INDEPENDENT_AMBULATORY_CARE_PROVIDER_SITE_OTHER): Payer: Medicare Other | Admitting: Cardiology

## 2020-05-26 ENCOUNTER — Other Ambulatory Visit: Payer: Self-pay

## 2020-05-26 VITALS — BP 138/76 | HR 101 | Ht 64.0 in | Wt 151.0 lb

## 2020-05-26 DIAGNOSIS — I2510 Atherosclerotic heart disease of native coronary artery without angina pectoris: Secondary | ICD-10-CM

## 2020-05-26 DIAGNOSIS — I10 Essential (primary) hypertension: Secondary | ICD-10-CM

## 2020-05-26 DIAGNOSIS — I428 Other cardiomyopathies: Secondary | ICD-10-CM

## 2020-05-26 DIAGNOSIS — E782 Mixed hyperlipidemia: Secondary | ICD-10-CM | POA: Diagnosis not present

## 2020-05-26 DIAGNOSIS — E088 Diabetes mellitus due to underlying condition with unspecified complications: Secondary | ICD-10-CM

## 2020-05-26 MED ORDER — FUROSEMIDE 40 MG PO TABS: 40.0000 mg | ORAL_TABLET | Freq: Every day | ORAL | 3 refills | Status: DC

## 2020-05-26 NOTE — Progress Notes (Signed)
Cardiology Office Note:    Date:  05/26/2020   ID:  [name], DOB 01/26/1951, MRN [id number]  PCP:  [name], MD  Cardiologist:  Garwin Brothers, MD   Referring MD: [name], MD    ASSESSMENT:    1. Coronary artery disease involving native coronary artery of native heart without angina pectoris   2. Mixed hyperlipidemia   3. Primary hypertension   4. Nonischemic cardiomyopathy (HCC)   5. Diabetes mellitus due to underlying condition with unspecified complications (HCC)    PLAN:    In order of problems listed above:  1. Coronary artery disease: She has minor disease in small vessels.  Medical management at this time. 2. Nonischemic cardiomyopathy: Patient is on guideline directed medical therapy.  She is taking for furosemide 40 mg every other day.  She has bilateral pedal edema and shortness of breath on exertion so IV increase furosemide to every day.  She will have a Chem-7 today.  I do not want to increase her Entresto for fear of hypotension in view of the fact that I am increasing her diuretic. 3. Mixed dyslipidemia: On statin therapy.  Diet was emphasized.  Lipids followed by primary care physician. 4. She will be seen in follow-up appointment in 2 weeks or earlier if he has any concerns.  She had multiple questions which were answered to her satisfaction.  She is a diabetic and diet was emphasized.  Also dietary emphasized to her about salt intake issues, lifestyle modification and daily weight checks and she promises to do better.  She has gained significant weight in the past few days.    Medication Adjustments/Labs and Tests Ordered: Current medicines are reviewed at length with the patient today.  Concerns regarding medicines are outlined above.  No orders of the defined types were placed in this encounter.  No orders of the defined types were placed in this encounter.    Chief Complaint  Patient presents with  . Follow-up  . Multiple  questions to discuss     History of Present Illness:    [name] is a 70 y.o. female.  Patient has past medical history of coronary artery disease and nonischemic cardiomyopathy, essential hypertension and dyslipidemia.  She is a diabetic.  She denies any problems at this time and takes care of activities of daily living.  No chest pain orthopnea or PND.  She gets short of breath with minimal activity.  At the time of my evaluation, the patient is alert awake oriented and in no distress.  Past Medical History:  Diagnosis Date  . Abnormal nuclear stress test 03/29/2020  . Acute combined systolic and diastolic heart failure (HCC) 03/09/2020  . Atypical Angina pectoris (HCC) 03/19/2020  . CAD (coronary artery disease) 04/21/2020  . Depression   . Diabetes mellitus due to underlying condition with unspecified complications (HCC) 03/09/2020  . Diabetes mellitus, type 2 (HCC)   . DVT (deep venous thrombosis) (HCC) 07/2011   S/P coumadin x 6 months  . Dyspnea on exertion 03/19/2020  . HTN (hypertension)   . Hyperlipidemia   . Hypertension   . Hypokalemia 05/08/2012  . Hypotension 05/08/2012  . Iron deficiency anemia   . Iron deficiency anemia, unspecified, symptomatic with dyspnea, dizziness, and shortness of breath with activity 05/08/2012  . Leukocytosis 05/08/2012  . Nonischemic cardiomyopathy (HCC) 04/21/2020  . Tobacco abuse 05/08/2012  . Wheezing    Recently put on albuterol, no history of asthma  Past Surgical History:  Procedure Laterality Date  . APPENDECTOMY    . Bilateral oophrectomy     Secondary to benign cysts  . colonoscopy     with polypectomy  . LEFT HEART CATH AND CORONARY ANGIOGRAPHY N/A 03/29/2020   Procedure: LEFT HEART CATH AND CORONARY ANGIOGRAPHY;  Surgeon: Marykay Lex, MD;  Location: [facility];  Service: Cardiovascular;  Laterality: N/A;  . TONSILLECTOMY      Current Medications: Current Meds  Medication Sig  . Aflibercept 2 MG/0.05ML  SOSY Place 1 mg into both eyes every 8 (eight) weeks.  Marland Kitchen aspirin 81 MG EC tablet Take 81 mg by mouth every evening.   . carboxymethylcellulose (REFRESH PLUS) 0.5 % SOLN Place 1 drop into both eyes 3 (three) times daily as needed (dry eye).  . Ferrous Sulfate (IRON PO) Take 65 mg by mouth See admin instructions. 4-5 times a week  . furosemide (LASIX) 40 MG tablet Take 20 mg by mouth every other day.  . metFORMIN (GLUCOPHAGE-XR) 500 MG 24 hr tablet Take 500 mg by mouth every evening.   . Multiple Vitamin (MULTIVITAMIN) tablet Take 1 tablet by mouth daily. PreserVision Ired 2  . nitroGLYCERIN (NITROSTAT) 0.4 MG SL tablet Place 1 tablet (0.4 mg total) under the tongue every 5 (five) minutes as needed.  . sacubitril-valsartan (ENTRESTO) 24-26 MG Take 1 tablet by mouth 2 (two) times daily.  . simvastatin (ZOCOR) 20 MG tablet Take 20 mg by mouth every evening.      Allergies:   Patient has no known allergies.   Social History   Socioeconomic History  . Marital status: Married    Spouse name: Camree Wigington  . Number of children: 2  . Years of education: Not on file  . Highest education level: Not on file  Occupational History  . Occupation: Production manager for a company    Employer: [employer]  Tobacco Use  . Smoking status: Current Every Day Smoker    Packs/day: 0.50    Years: 40.00    Pack years: 20.00    Types: Cigarettes  . Smokeless tobacco: Never Used  Substance and Sexual Activity  . Alcohol use: Yes    Alcohol/week: 15.0 standard drinks    Types: 15 Cans of beer per week  . Drug use: No  . Sexual activity: Never  Other Topics Concern  . Not on file  Social History Narrative   Married.  Lives in husband here in [place].  Normally independent of ADLs.   Social Determinants of Health   Financial Resource Strain: Not on file  Food Insecurity: Not on file  Transportation Needs: Not on file  Physical Activity: Not on file  Stress: Not on file  Social Connections: Not on file      Family History: The patient's family history includes Alcoholism in her father; Breast cancer in her mother; CAD in her paternal grandmother; COPD in her mother; Diabetes in her maternal grandmother and paternal grandmother; Liver disease in her father; Stroke in her maternal grandfather and paternal grandfather; Uterine cancer in her mother.  ROS:   Please see the history of present illness.    All other systems reviewed and are negative.  EKGs/Labs/Other Studies Reviewed:    The following studies were reviewed today: IMPRESSIONS    1. Left ventricular ejection fraction, by estimation, is 20 to 25%. The  left ventricle has severely decreased function. The left ventricle  demonstrates global hypokinesis. The left ventricular internal cavity size  was moderately dilated. There is mild  concentric left ventricular hypertrophy. Left ventricular diastolic  parameters are consistent with Grade III diastolic dysfunction  (restrictive). The average left ventricular global longitudinal strain is  -5.9 %. The global longitudinal strain is  abnormal.  2. Right ventricular systolic function is mildly reduced. The right  ventricular size is mildly enlarged. There is moderately elevated  pulmonary artery systolic pressure.  3. Left atrial size was severely dilated.  4. Right atrial size was severely dilated.  5. The mitral valve is normal in structure. Mild mitral valve  regurgitation. No evidence of mitral stenosis.  6. Tricuspid valve regurgitation is moderate.  7. The aortic valve is normal in structure. Aortic valve regurgitation is  not visualized. No aortic stenosis is present.  8. The inferior vena cava is normal in size with greater than 50%  respiratory variability, suggesting right atrial pressure of 3 mmHg.   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  There is severe left ventricular systolic dysfunction. The left ventricular ejection fraction is less than 25% by visual estimate.  LV end diastolic pressure is moderately elevated - 22 mmHg  There is no aortic valve stenosis.   SUMMARY  Angiographically Normal Coronary Arteries in the Left Coronary System -> very small caliber lateral OM 2 has an almost aneurysmal dilation followed by smooth tapering (would not explain abnormal stress test or reduced EF)  Severely reduced LV function with moderately dilated left ventricle and moderate increased left ventricular pressures.     Recent Labs: 03/19/2020: Hemoglobin 18.1; Platelets 186 05/10/2020: BUN 16; Creatinine, Ser 0.95; Potassium 3.8; Sodium 144  Recent Lipid Panel No results found for: CHOL, TRIG, HDL, CHOLHDL, VLDL, LDLCALC, LDLDIRECT  Physical Exam:    VS:  BP 138/76 (BP Location: Right Arm, Patient Position: Sitting)   Pulse (!) 101   Ht 5\' 4"  (1.626 m)   Wt 151 lb (68.5 kg)   SpO2 97%   BMI 25.92 kg/m     Wt Readings from Last 3 Encounters:  05/26/20 151 lb (68.5 kg)  04/21/20 139 lb 3.2 oz (63.1 kg)  03/29/20 140 lb (63.5 kg)     GEN: Patient is in no acute distress HEENT: Normal NECK: No JVD; No carotid bruits LYMPHATICS: No lymphadenopathy CARDIAC: Hear sounds regular, 2/6 systolic murmur at the apex. RESPIRATORY:  Clear to auscultation without rales, wheezing or rhonchi  ABDOMEN: Soft, non-tender, non-distended MUSCULOSKELETAL:  No edema; No deformity  SKIN: Warm and dry NEUROLOGIC:  Alert and oriented x 3 PSYCHIATRIC:  Normal affect   Signed, 03/31/20, MD  05/26/2020 2:23 PM    [facility]

## 2020-05-26 NOTE — Patient Instructions (Signed)
Medication Instructions:  Your physician has recommended you make the following change in your medication:   Take Lasix 40 mg daily.  *If you need a refill on your cardiac medications before your next appointment, please call your pharmacy*   Lab Work: Your physician recommends that you have a BMET today.  If you have labs (blood work) drawn today and your tests are completely normal, you will receive your results only by:  MyChart Message (if you have MyChart) OR  A paper copy in the mail If you have any lab test that is abnormal or we need to change your treatment, we will call you to review the results.   Testing/Procedures: None ordered   Follow-Up: At [facility], you and your health needs are our priority.  As part of our continuing mission to provide you with exceptional heart care, we have created designated Provider Care Teams.  These Care Teams include your primary Cardiologist (physician) and Advanced Practice Providers (APPs -  Physician Assistants and Nurse Practitioners) who all work together to provide you with the care you need, when you need it.  We recommend signing up for the patient portal called "MyChart".  Sign up information is provided on this After Visit Summary.  MyChart is used to connect with patients for Virtual Visits (Telemedicine).  Patients are able to view lab/test results, encounter notes, upcoming appointments, etc.  Non-urgent messages can be sent to your provider as well.   To learn more about what you can do with MyChart, go to [email or web address].    Your next appointment:   2 week(s)  The format for your next appointment:   In Person  Provider:   Belva Crome, MD   Other Instructions NA

## 2020-05-27 LAB — BASIC METABOLIC PANEL
BUN/Creatinine Ratio: 25 (ref 12–28)
BUN: 24 mg/dL (ref 8–27)
CO2: 21 mmol/L (ref 20–29)
Calcium: 9.1 mg/dL (ref 8.7–10.3)
Chloride: 107 mmol/L — ABNORMAL HIGH (ref 96–106)
Creatinine, Ser: 0.97 mg/dL (ref 0.57–1.00)
GFR calc Af Amer: 69 mL/min/{1.73_m2} (ref >59)
GFR calc non Af Amer: 60 mL/min/{1.73_m2} (ref >59)
Glucose: 117 mg/dL — ABNORMAL HIGH (ref 65–99)
Potassium: 3.9 mmol/L (ref 3.5–5.2)
Sodium: 149 mmol/L — ABNORMAL HIGH (ref 134–144)

## 2020-06-08 ENCOUNTER — Ambulatory Visit: Payer: Medicare Other | Admitting: Cardiology

## 2020-06-08 ENCOUNTER — Encounter: Payer: Self-pay | Admitting: Cardiology

## 2020-06-08 ENCOUNTER — Other Ambulatory Visit: Payer: Self-pay

## 2020-06-08 ENCOUNTER — Ambulatory Visit (INDEPENDENT_AMBULATORY_CARE_PROVIDER_SITE_OTHER): Payer: Medicare Other | Admitting: Cardiology

## 2020-06-08 VITALS — BP 136/82 | HR 56 | Ht 64.0 in | Wt 152.6 lb

## 2020-06-08 DIAGNOSIS — Z720 Tobacco use: Secondary | ICD-10-CM

## 2020-06-08 DIAGNOSIS — I428 Other cardiomyopathies: Secondary | ICD-10-CM | POA: Diagnosis not present

## 2020-06-08 DIAGNOSIS — R0609 Other forms of dyspnea: Secondary | ICD-10-CM

## 2020-06-08 DIAGNOSIS — E782 Mixed hyperlipidemia: Secondary | ICD-10-CM

## 2020-06-08 DIAGNOSIS — E088 Diabetes mellitus due to underlying condition with unspecified complications: Secondary | ICD-10-CM | POA: Diagnosis not present

## 2020-06-08 DIAGNOSIS — R0600 Dyspnea, unspecified: Secondary | ICD-10-CM

## 2020-06-08 LAB — BASIC METABOLIC PANEL
BUN/Creatinine Ratio: 23 (ref 12–28)
BUN: 24 mg/dL (ref 8–27)
CO2: 21 mmol/L (ref 20–29)
Calcium: 8.9 mg/dL (ref 8.7–10.3)
Chloride: 106 mmol/L (ref 96–106)
Creatinine, Ser: 1.04 mg/dL — ABNORMAL HIGH (ref 0.57–1.00)
GFR calc Af Amer: 63 mL/min/{1.73_m2} (ref >59)
GFR calc non Af Amer: 55 mL/min/{1.73_m2} — ABNORMAL LOW (ref >59)
Glucose: 129 mg/dL — ABNORMAL HIGH (ref 65–99)
Potassium: 3.5 mmol/L (ref 3.5–5.2)
Sodium: 147 mmol/L — ABNORMAL HIGH (ref 134–144)

## 2020-06-08 NOTE — Progress Notes (Signed)
Cardiology Office Note:    Date:  06/08/2020   ID:  [name], DOB [birth year], MRN [id number]  PCP:  Carol Done., MD  Cardiologist:  Carol Brothers, MD   Referring MD: Carol Done., MD    ASSESSMENT:    1. Nonischemic cardiomyopathy (HCC)   2. Mixed hyperlipidemia   3. Diabetes mellitus due to underlying condition with unspecified complications (HCC)   4. Tobacco abuse   5. Dyspnea on exertion    PLAN:    In order of problems listed above:  1. Primary prevention stressed with the patient.  Importance of compliance with diet medication stressed and she vocalized understanding. 2. Nonischemic cardiomyopathy and congestive heart failure: Symptoms have improved with increase in diuretic.  She is tolerating medications well.  I discussed diet including salt intake issues and daily weights and she promises to do so.  Electrophysiology evaluation was discussed and she is not keen on it.  She wants to try medical therapy and I respect her wishes. 3. Essential hypertension: Blood pressure stable. 4. Diabetes mellitus and mixed dyslipidemia: Diet was emphasized.  Patient is trying to best take care of herself. 5. Cigarette smoker: I spent 5 minutes with the patient discussing solely about smoking. Smoking cessation was counseled. I suggested to the patient also different medications and pharmacological interventions. Patient is keen to try stopping on its own at this time. He will get back to me if he needs any further assistance in this matter. 6. Patient will be seen in follow-up appointment in 4 weeks or earlier if the patient has any concerns    Medication Adjustments/Labs and Tests Ordered: Current medicines are reviewed at length with the patient today.  Concerns regarding medicines are outlined above.  No orders of the defined types were placed in this encounter.  No orders of the defined types were placed in this encounter.    No chief complaint on file.     History of Present Illness:    [name] is a 70 y.o. female.  Patient has past medical history of congestive heart failure, non-ischemic cardiomyopathy, essential hypertension dyslipidemia.  Patient denies any problems at this time and takes care of activities of daily living.  No chest pain orthopnea or PND.  When she exerts more than usual she gets out of breath.  She is using furosemide 40 mg daily.  At the time of my evaluation, the patient is alert awake oriented and in no distress.  Past Medical History:  Diagnosis Date  . Abnormal nuclear stress test 03/29/2020  . Acute combined systolic and diastolic heart failure (HCC) 03/09/2020  . Atypical Angina pectoris (HCC) 03/19/2020  . CAD (coronary artery disease) 04/21/2020  . Depression   . Diabetes mellitus due to underlying condition with unspecified complications (HCC) 03/09/2020  . Diabetes mellitus, type 2 (HCC)   . DVT (deep venous thrombosis) (HCC) 07/2011   S/P coumadin x 6 months  . Dyspnea on exertion 03/19/2020  . HTN (hypertension)   . Hyperlipidemia   . Hypertension   . Hypokalemia 05/08/2012  . Hypotension 05/08/2012  . Iron deficiency anemia   . Iron deficiency anemia, unspecified, symptomatic with dyspnea, dizziness, and shortness of breath with activity 05/08/2012  . Leukocytosis 05/08/2012  . Nonischemic cardiomyopathy (HCC) 04/21/2020  . Tobacco abuse 05/08/2012  . Wheezing    Recently put on albuterol, no history of asthma    Past Surgical History:  Procedure Laterality Date  . APPENDECTOMY    .  Bilateral oophrectomy     Secondary to benign cysts  . colonoscopy     with polypectomy  . LEFT HEART CATH AND CORONARY ANGIOGRAPHY N/A 03/29/2020   Procedure: LEFT HEART CATH AND CORONARY ANGIOGRAPHY;  Surgeon: Carol Lex, MD;  Location: [facility] INVASIVE CV LAB;  Service: Cardiovascular;  Laterality: N/A;  . TONSILLECTOMY      Current Medications: Current Meds  Medication Sig  . Aflibercept 2 MG/0.05ML  SOSY Place 1 mg into both eyes every 8 (eight) weeks.  Marland Kitchen aspirin 81 MG EC tablet Take 81 mg by mouth every evening.   . carboxymethylcellulose (REFRESH PLUS) 0.5 % SOLN Place 1 drop into both eyes 3 (three) times daily as needed (dry eye).  . Ferrous Sulfate (IRON PO) Take 65 mg by mouth See admin instructions. 4-5 times a week  . furosemide (LASIX) 40 MG tablet Take 1 tablet (40 mg total) by mouth daily.  . metFORMIN (GLUCOPHAGE-XR) 500 MG 24 hr tablet Take 500 mg by mouth every evening.   . Multiple Vitamin (MULTIVITAMIN) tablet Take 1 tablet by mouth daily. PreserVision Ired 2  . nitroGLYCERIN (NITROSTAT) 0.4 MG SL tablet Place 0.4 mg under the tongue every 5 (five) minutes as needed for chest pain.  . sacubitril-valsartan (ENTRESTO) 24-26 MG Take 1 tablet by mouth 2 (two) times daily.  . simvastatin (ZOCOR) 20 MG tablet Take 20 mg by mouth every evening.      Allergies:   Patient has no known allergies.   Social History   Socioeconomic History  . Marital status: Married    Spouse name: [place] Silveria  . Number of children: 2  . Years of education: Not on file  . Highest education level: Not on file  Occupational History  . Occupation: Production manager for a company    Employer: [employer]  Tobacco Use  . Smoking status: Current Every Day Smoker    Packs/day: 0.50    Years: 40.00    Pack years: 20.00    Types: Cigarettes  . Smokeless tobacco: Never Used  Substance and Sexual Activity  . Alcohol use: Yes    Alcohol/week: 15.0 standard drinks    Types: 15 Cans of beer per week  . Drug use: No  . Sexual activity: Never  Other Topics Concern  . Not on file  Social History Narrative   Married.  Lives in husband here in [place].  Normally independent of ADLs.   Social Determinants of Health   Financial Resource Strain: Not on file  Food Insecurity: Not on file  Transportation Needs: Not on file  Physical Activity: Not on file  Stress: Not on file  Social Connections: Not on  file     Family History: The patient's family history includes Alcoholism in her father; Breast cancer in her mother; CAD in her paternal grandmother; COPD in her mother; Diabetes in her maternal grandmother and paternal grandmother; Liver disease in her father; Stroke in her maternal grandfather and paternal grandfather; Uterine cancer in her mother.  ROS:   Please see the history of present illness.    All other systems reviewed and are negative.  EKGs/Labs/Other Studies Reviewed:    The following studies were reviewed today: I discussed my findings with the patient at length.   Recent Labs: 03/19/2020: Hemoglobin 18.1; Platelets 186 05/26/2020: BUN 24; Creatinine, Ser 0.97; Potassium 3.9; Sodium 149  Recent Lipid Panel No results found for: CHOL, TRIG, HDL, CHOLHDL, VLDL, LDLCALC, LDLDIRECT  Physical Exam:    VS:  BP 136/82   Pulse (!) 56   Ht 5\' 4"  (1.626 m)   Wt 152 lb 9.6 oz (69.2 kg)   SpO2 96%   BMI 26.19 kg/m     Wt Readings from Last 3 Encounters:  06/08/20 152 lb 9.6 oz (69.2 kg)  05/26/20 151 lb (68.5 kg)  04/21/20 139 lb 3.2 oz (63.1 kg)     GEN: Patient is in no acute distress HEENT: Normal NECK: No JVD; No carotid bruits LYMPHATICS: No lymphadenopathy CARDIAC: Hear sounds regular, 2/6 systolic murmur at the apex. RESPIRATORY:  Clear to auscultation without rales, wheezing or rhonchi  ABDOMEN: Soft, non-tender, non-distended MUSCULOSKELETAL: Bilateral 2+ edema; No deformity  SKIN: Warm and dry NEUROLOGIC:  Alert and oriented x 3 PSYCHIATRIC:  Normal affect   Signed, 06/19/20, MD  06/08/2020 8:24 AM    [facility]

## 2020-06-08 NOTE — Patient Instructions (Signed)
Medication Instructions:  No medication changes. *If you need a refill on your cardiac medications before your next appointment, please call your pharmacy*   Lab Work: You had a BMET today in the office. If you have labs (blood work) drawn today and your tests are completely normal, you will receive your results only by: . MyChart Message (if you have MyChart) OR . A paper copy in the mail If you have any lab test that is abnormal or we need to change your treatment, we will call you to review the results.   Testing/Procedures: None ordered   Follow-Up: At [facility], you and your health needs are our priority.  As part of our continuing mission to provide you with exceptional heart care, we have created designated Provider Care Teams.  These Care Teams include your primary Cardiologist (physician) and Advanced Practice Providers (APPs -  Physician Assistants and Nurse Practitioners) who all work together to provide you with the care you need, when you need it.  We recommend signing up for the patient portal called "MyChart".  Sign up information is provided on this After Visit Summary.  MyChart is used to connect with patients for Virtual Visits (Telemedicine).  Patients are able to view lab/test results, encounter notes, upcoming appointments, etc.  Non-urgent messages can be sent to your provider as well.   To learn more about what you can do with MyChart, go to [email or web address].    Your next appointment:   1 month(s)  The format for your next appointment:   In Person  Provider:   Rajan Revankar, MD   Other Instructions NA  

## 2020-06-16 ENCOUNTER — Other Ambulatory Visit: Payer: Self-pay | Admitting: Cardiology

## 2020-07-16 DIAGNOSIS — R062 Wheezing: Secondary | ICD-10-CM | POA: Insufficient documentation

## 2020-07-19 ENCOUNTER — Other Ambulatory Visit: Payer: Self-pay

## 2020-07-19 ENCOUNTER — Ambulatory Visit (INDEPENDENT_AMBULATORY_CARE_PROVIDER_SITE_OTHER): Payer: Medicare Other | Admitting: Cardiology

## 2020-07-19 ENCOUNTER — Encounter: Payer: Self-pay | Admitting: Cardiology

## 2020-07-19 VITALS — BP 124/80 | HR 88 | Ht 64.0 in | Wt 169.0 lb

## 2020-07-19 DIAGNOSIS — Z720 Tobacco use: Secondary | ICD-10-CM

## 2020-07-19 DIAGNOSIS — I10 Essential (primary) hypertension: Secondary | ICD-10-CM | POA: Diagnosis not present

## 2020-07-19 DIAGNOSIS — I428 Other cardiomyopathies: Secondary | ICD-10-CM

## 2020-07-19 DIAGNOSIS — E782 Mixed hyperlipidemia: Secondary | ICD-10-CM

## 2020-07-19 DIAGNOSIS — F17210 Nicotine dependence, cigarettes, uncomplicated: Secondary | ICD-10-CM | POA: Diagnosis not present

## 2020-07-19 DIAGNOSIS — E088 Diabetes mellitus due to underlying condition with unspecified complications: Secondary | ICD-10-CM

## 2020-07-19 NOTE — Progress Notes (Signed)
Cardiology Office Note:    Date:  07/19/2020   ID:  [name], DOB [birth year], MRN [id number]  PCP:  [name], MD  Cardiologist:  Garwin Brothers, MD   Referring MD: [name], MD    ASSESSMENT:    1. Nonischemic cardiomyopathy (HCC)   2. Primary hypertension   3. Mixed hyperlipidemia   4. Diabetes mellitus due to underlying condition with unspecified complications (HCC)   5. Tobacco abuse    PLAN:    In order of problems listed above:  1. Primary prevention stressed with the patient.  Importance of compliance with diet medication stressed and she vocalized understanding. 2. Nonischemic cardiomyopathy: Patient is on guideline directed medical therapy and tolerating well.  She will have a Chem-7 today.  I told her that she has to be very compliant with weighing herself regularly, watching salt intake diet and keep it very minimal and about compliance with regularly taking her medications and she promises to do so.  We will do a follow-up echocardiogram in the next several weeks before the next appointment to assess this. 3. Essential hypertension: Blood pressure stable and diet was emphasized. 4. Diabetes mellitus: Managed by primary care physician and diet emphasized 5. Cigarette smoker: I spent 5 minutes with the patient discussing solely about smoking. Smoking cessation was counseled. I suggested to the patient also different medications and pharmacological interventions. Patient is keen to try stopping on its own at this time. He will get back to me if he needs any further assistance in this matter. 6. Patient will be seen in follow-up appointment in 2 months or earlier if the patient has any concerns    Medication Adjustments/Labs and Tests Ordered: Current medicines are reviewed at length with the patient today.  Concerns regarding medicines are outlined above.  No orders of the defined types were placed in this encounter.  No orders of the defined  types were placed in this encounter.    No chief complaint on file.    History of Present Illness:    [name] is a 70 y.o. female.  Patient has past medical history of nonischemic cardiomyopathy, essential hypertension dyslipidemia and diabetes mellitus..  Patient denies any problems at this time and takes care of activities of daily living.  No chest pain orthopnea or PND.  She tells me that she has gained a couple pounds in weight over the past 2 weeks.  She tells me that she has not been very compliant with diet and sometimes forgets to take her fluid medications.  At the time of my evaluation, the patient is alert awake oriented and in no distress.  Past Medical History:  Diagnosis Date  . Abnormal nuclear stress test 03/29/2020  . Acute combined systolic and diastolic heart failure (HCC) 03/09/2020  . Atypical Angina pectoris (HCC) 03/19/2020  . CAD (coronary artery disease) 04/21/2020  . Depression   . Diabetes mellitus due to underlying condition with unspecified complications (HCC) 03/09/2020  . Diabetes mellitus, type 2 (HCC)   . DVT (deep venous thrombosis) (HCC) 07/2011   S/P coumadin x 6 months  . Dyspnea on exertion 03/19/2020  . HTN (hypertension)   . Hyperlipidemia   . Hypertension   . Hypokalemia 05/08/2012  . Hypotension 05/08/2012  . Iron deficiency anemia   . Iron deficiency anemia, unspecified, symptomatic with dyspnea, dizziness, and shortness of breath with activity 05/08/2012  . Leukocytosis 05/08/2012  . Nonischemic cardiomyopathy (HCC) 04/21/2020  . Tobacco abuse  05/08/2012  . Wheezing    Recently put on albuterol, no history of asthma    Past Surgical History:  Procedure Laterality Date  . APPENDECTOMY    . Bilateral oophrectomy     Secondary to benign cysts  . colonoscopy     with polypectomy  . LEFT HEART CATH AND CORONARY ANGIOGRAPHY N/A 03/29/2020   Procedure: LEFT HEART CATH AND CORONARY ANGIOGRAPHY;  Surgeon: Marykay Lex, MD;  [facility]  [facility] INVASIVE CV LAB;  Service: Cardiovascular;  Laterality: N/A;  . TONSILLECTOMY      Current Medications: Current Meds  Medication Sig  . Aflibercept 2 MG/0.05ML SOSY Place 1 mg into both eyes every 8 (eight) weeks.  Marland Kitchen aspirin 81 MG EC tablet Take 81 mg by mouth every evening.   . carboxymethylcellulose (REFRESH PLUS) 0.5 % SOLN Place 1 drop into both eyes 3 (three) times daily as needed (dry eye).  . ferrous sulfate 324 (65 Fe) MG TBEC Take 65 mg by mouth 4 (four) times a week.  . furosemide (LASIX) 40 MG tablet Take 1 tablet (40 mg total) by mouth daily.  . metFORMIN (GLUCOPHAGE-XR) 500 MG 24 hr tablet Take 500 mg by mouth every evening.   . Multiple Vitamin (MULTIVITAMIN) tablet Take 1 tablet by mouth daily. PreserVision Ired 2  . nitroGLYCERIN (NITROSTAT) 0.4 MG SL tablet Place 0.4 mg under the tongue every 5 (five) minutes as needed for chest pain.  . sacubitril-valsartan (ENTRESTO) 24-26 MG Take 1 tablet by mouth 2 (two) times daily.  . simvastatin (ZOCOR) 20 MG tablet Take 20 mg by mouth every evening.      Allergies:   Patient has no known allergies.   Social History   Socioeconomic History  . Marital status: Married    Spouse name: Raffaella Edison  . Number of children: 2  . Years of education: Not on file  . Highest education level: Not on file  Occupational History  . Occupation: Production manager for a company    Employer: [employer]  Tobacco Use  . Smoking status: Current Every Day Smoker    Packs/day: 0.50    Years: 40.00    Pack years: 20.00    Types: Cigarettes  . Smokeless tobacco: Never Used  Substance and Sexual Activity  . Alcohol use: Yes    Alcohol/week: 15.0 standard drinks    Types: 15 Cans of beer per week  . Drug use: No  . Sexual activity: Never  Other Topics Concern  . Not on file  Social History Narrative   Married.  Lives in husband here in [place].  Normally independent of ADLs.   Social Determinants of Health   Financial Resource Strain: Not  on file  Food Insecurity: Not on file  Transportation Needs: Not on file  Physical Activity: Not on file  Stress: Not on file  Social Connections: Not on file     Family History: The patient's family history includes Alcoholism in her father; Breast cancer in her mother; CAD in her paternal grandmother; COPD in her mother; Diabetes in her maternal grandmother and paternal grandmother; Liver disease in her father; Stroke in her maternal grandfather and paternal grandfather; Uterine cancer in her mother.  ROS:   Please see the history of present illness.    All other systems reviewed and are negative.  EKGs/Labs/Other Studies Reviewed:    The following studies were reviewed today: I discussed my findings with the patient at length   Recent Labs: 03/19/2020: Hemoglobin 18.1; Platelets  186 06/08/2020: BUN 24; Creatinine, Ser 1.04; Potassium 3.5; Sodium 147  Recent Lipid Panel No results found for: CHOL, TRIG, HDL, CHOLHDL, VLDL, LDLCALC, LDLDIRECT  Physical Exam:    VS:  BP 124/80   Pulse 88   Ht 5\' 4"  (1.626 m)   Wt 169 lb (76.7 kg)   SpO2 95%   BMI 29.01 kg/m     Wt Readings from Last 3 Encounters:  07/19/20 169 lb (76.7 kg)  06/08/20 152 lb 9.6 oz (69.2 kg)  05/26/20 151 lb (68.5 kg)     GEN: Patient is in no acute distress HEENT: Normal NECK: No JVD; No carotid bruits LYMPHATICS: No lymphadenopathy CARDIAC: Hear sounds regular, 2/6 systolic murmur at the apex. RESPIRATORY:  Clear to auscultation without rales, wheezing or rhonchi  ABDOMEN: Soft, non-tender, non-distended MUSCULOSKELETAL:  No edema; No deformity  SKIN: Warm and dry NEUROLOGIC:  Alert and oriented x 3 PSYCHIATRIC:  Normal affect   Signed, 07/24/20, MD  07/19/2020 3:55 PM    [facility]

## 2020-07-19 NOTE — Patient Instructions (Signed)
Medication Instructions:  No medication changes. *If you need a refill on your cardiac medications before your next appointment, please call your pharmacy*   Lab Work: Your physician recommends that you have labs done in the office today. You had a BMET done.  If you have labs (blood work) drawn today and your tests are completely normal, you will receive your results only by: Marland Kitchen MyChart Message (if you have MyChart) OR . A paper copy in the mail If you have any lab test that is abnormal or we need to change your treatment, we will call you to review the results.   Testing/Procedures: Your physician has requested that you have an echocardiogram. Echocardiography is a painless test that uses sound waves to create images of your heart. It provides your doctor with information about the size and shape of your heart and how well your heart's chambers and valves are working. This procedure takes approximately one hour. There are no restrictions for this procedure.     Follow-Up: At [facility], you and your health needs are our priority.  As part of our continuing mission to provide you with exceptional heart care, we have created designated Provider Care Teams.  These Care Teams include your primary Cardiologist (physician) and Advanced Practice Providers (APPs -  Physician Assistants and Nurse Practitioners) who all work together to provide you with the care you need, when you need it.  We recommend signing up for the patient portal called "MyChart".  Sign up information is provided on this After Visit Summary.  MyChart is used to connect with patients for Virtual Visits (Telemedicine).  Patients are able to view lab/test results, encounter notes, upcoming appointments, etc.  Non-urgent messages can be sent to your provider as well.   To learn more about what you can do with MyChart, go to [email or web address].    Your next appointment:   2 month(s)  The format for your next  appointment:   In Person  Provider:   Belva Crome, MD   Other Instructions  Echocardiogram An echocardiogram is a test that uses sound waves (ultrasound) to produce images of the heart. Images from an echocardiogram can provide important information about:  Heart size and shape.  The size and thickness and movement of your heart's walls.  Heart muscle function and strength.  Heart valve function or if you have stenosis. Stenosis is when the heart valves are too narrow.  If blood is flowing backward through the heart valves (regurgitation).  A tumor or infectious growth around the heart valves.  Areas of heart muscle that are not working well because of poor blood flow or injury from a heart attack.  Aneurysm detection. An aneurysm is a weak or damaged part of an artery wall. The wall bulges out from the normal force of blood pumping through the body. Tell a health care provider about:  Any allergies you have.  All medicines you are taking, including vitamins, herbs, eye drops, creams, and over-the-counter medicines.  Any blood disorders you have.  Any surgeries you have had.  Any medical conditions you have.  Whether you are pregnant or may be pregnant. What are the risks? Generally, this is a safe test. However, problems may occur, including an allergic reaction to dye (contrast) that may be used during the test. What happens before the test? No specific preparation is needed. You may eat and drink normally. What happens during the test?  You will take off your clothes from the waist up  and put on a hospital gown.  Electrodes or electrocardiogram (ECG)patches may be placed on your chest. The electrodes or patches are then connected to a device that monitors your heart rate and rhythm.  You will lie down on a table for an ultrasound exam. A gel will be applied to your chest to help sound waves pass through your skin.  A handheld device, called a transducer,  will be pressed against your chest and moved over your heart. The transducer produces sound waves that travel to your heart and bounce back (or "echo" back) to the transducer. These sound waves will be captured in real-time and changed into images of your heart that can be viewed on a video monitor. The images will be recorded on a computer and reviewed by your health care provider.  You may be asked to change positions or hold your breath for a short time. This makes it easier to get different views or better views of your heart.  In some cases, you may receive contrast through an IV in one of your veins. This can improve the quality of the pictures from your heart. The procedure may vary among health care providers and hospitals.   What can I expect after the test? You may return to your normal, everyday life, including diet, activities, and medicines, unless your health care provider tells you not to do that. Follow these instructions at home:  It is up to you to get the results of your test. Ask your health care provider, or the department that is doing the test, when your results will be ready.  Keep all follow-up visits. This is important. Summary  An echocardiogram is a test that uses sound waves (ultrasound) to produce images of the heart.  Images from an echocardiogram can provide important information about the size and shape of your heart, heart muscle function, heart valve function, and other possible heart problems.  You do not need to do anything to prepare before this test. You may eat and drink normally.  After the echocardiogram is completed, you may return to your normal, everyday life, unless your health care provider tells you not to do that. This information is not intended to replace advice given to you by your health care provider. Make sure you discuss any questions you have with your health care provider. Document Revised: 11/25/2019 Document Reviewed:  11/25/2019 Elsevier Patient Education  2021 [employer].

## 2020-07-20 LAB — BASIC METABOLIC PANEL
BUN/Creatinine Ratio: 25 (ref 12–28)
BUN: 31 mg/dL — ABNORMAL HIGH (ref 8–27)
CO2: 22 mmol/L (ref 20–29)
Calcium: 9.4 mg/dL (ref 8.7–10.3)
Chloride: 103 mmol/L (ref 96–106)
Creatinine, Ser: 1.22 mg/dL — ABNORMAL HIGH (ref 0.57–1.00)
Glucose: 135 mg/dL — ABNORMAL HIGH (ref 65–99)
Potassium: 4.3 mmol/L (ref 3.5–5.2)
Sodium: 145 mmol/L — ABNORMAL HIGH (ref 134–144)
eGFR: 48 mL/min/{1.73_m2} — ABNORMAL LOW (ref >59)

## 2020-07-21 ENCOUNTER — Telehealth: Payer: Self-pay

## 2020-07-21 NOTE — Telephone Encounter (Signed)
-----   Message from Garwin Brothers, MD sent at 07/20/2020 10:29 AM EDT ----- Renal function is stable.  The results of the study is unremarkable. Please inform patient. I will discuss in detail at next appointment. Cc  primary care/referring physician Garwin Brothers, MD 07/20/2020 10:29 AM

## 2020-07-21 NOTE — Telephone Encounter (Signed)
Spoke with patient regarding results and recommendation.  Patient verbalizes understanding and is agreeable to plan of care. Advised patient to call back with any issues or concerns.  

## 2020-07-26 ENCOUNTER — Telehealth: Payer: Self-pay | Admitting: Cardiology

## 2020-07-26 NOTE — Telephone Encounter (Signed)
PT called into speak with Ladonna Snide  to check if her travel insurance paperwork is ready for pickup for her canceling her trip.Please advise.

## 2020-07-27 NOTE — Telephone Encounter (Signed)
Form complete and pt aware.

## 2020-08-13 ENCOUNTER — Ambulatory Visit (INDEPENDENT_AMBULATORY_CARE_PROVIDER_SITE_OTHER): Payer: Medicare Other

## 2020-08-13 ENCOUNTER — Other Ambulatory Visit: Payer: Self-pay

## 2020-08-13 DIAGNOSIS — I428 Other cardiomyopathies: Secondary | ICD-10-CM | POA: Diagnosis not present

## 2020-08-13 LAB — ECHOCARDIOGRAM COMPLETE
Area-P 1/2: 4.15 cm2
Calc EF: 24.2 %
S' Lateral: 5.3 cm
Single Plane A2C EF: 22.5 %
Single Plane A4C EF: 27.1 %

## 2020-08-17 ENCOUNTER — Telehealth: Payer: Self-pay

## 2020-08-17 ENCOUNTER — Other Ambulatory Visit: Payer: Self-pay

## 2020-08-17 NOTE — Telephone Encounter (Signed)
Left message on patients voicemail to please return our call.   

## 2020-08-17 NOTE — Telephone Encounter (Signed)
-----   Message from Garwin Brothers, MD sent at 08/17/2020  4:07 AM EDT ----- Ejection fraction is still severely depressed .  Please give her an appointment in the next couple of weeks to discuss these results. Garwin Brothers, MD 08/17/2020 4:06 AM

## 2020-08-17 NOTE — Telephone Encounter (Signed)
Patient is returning call.  °

## 2020-08-17 NOTE — Telephone Encounter (Signed)
Spoke with patient regarding results and recommendation.  Patient verbalizes understanding and is agreeable to plan of care. Advised patient to call back with any issues or concerns.  

## 2020-09-27 ENCOUNTER — Encounter: Payer: Self-pay | Admitting: Cardiology

## 2020-09-27 ENCOUNTER — Other Ambulatory Visit: Payer: Self-pay

## 2020-09-27 ENCOUNTER — Ambulatory Visit (INDEPENDENT_AMBULATORY_CARE_PROVIDER_SITE_OTHER): Payer: Medicare Other | Admitting: Cardiology

## 2020-09-27 VITALS — BP 134/82 | HR 80 | Ht 64.0 in | Wt 168.8 lb

## 2020-09-27 DIAGNOSIS — I428 Other cardiomyopathies: Secondary | ICD-10-CM

## 2020-09-27 DIAGNOSIS — R0989 Other specified symptoms and signs involving the circulatory and respiratory systems: Secondary | ICD-10-CM

## 2020-09-27 DIAGNOSIS — I209 Angina pectoris, unspecified: Secondary | ICD-10-CM

## 2020-09-27 DIAGNOSIS — I5041 Acute combined systolic (congestive) and diastolic (congestive) heart failure: Secondary | ICD-10-CM | POA: Diagnosis not present

## 2020-09-27 DIAGNOSIS — I2510 Atherosclerotic heart disease of native coronary artery without angina pectoris: Secondary | ICD-10-CM | POA: Diagnosis not present

## 2020-09-27 DIAGNOSIS — E782 Mixed hyperlipidemia: Secondary | ICD-10-CM

## 2020-09-27 DIAGNOSIS — I10 Essential (primary) hypertension: Secondary | ICD-10-CM | POA: Diagnosis not present

## 2020-09-27 DIAGNOSIS — R931 Abnormal findings on diagnostic imaging of heart and coronary circulation: Secondary | ICD-10-CM

## 2020-09-27 NOTE — Progress Notes (Signed)
Cardiology Office Note:    Date:  09/27/2020   ID:  [name], DOB 1950/12/20, MRN [id number]  PCP:  [name], MD  Cardiologist:  Garwin Brothers, MD   Referring MD: [name], MD    ASSESSMENT:    1. Coronary artery disease involving native coronary artery of native heart without angina pectoris   2. Primary hypertension   3. Mixed hyperlipidemia    PLAN:    In order of problems listed above:  Primary prevention stressed with the patient.  Importance of compliance with diet medication stressed and she vocalized understanding Congestive heart failure with reduced ejection fraction: I discussed my findings with the patient at length and she understands.  She is on guideline directed medical therapy.  Lifestyle modification and salt intake issues were discussed.  In view of these findings I discussed intracardiac defibrillator for primary prevention of sudden cardiac death.  She is not keen on it at this time she wants more time to think about it and get back to [place].  If she changes her mind she will let us know.  Benefits and potential risks explained.  I told her that with her ejection fraction she is at risk for sudden cardiac death and she understands. Essential hypertension: Blood pressure stable and diet was emphasized. Diabetes mellitus and mixed dyslipidemia: Diet emphasized.  She promises to do better and walk on a regular basis. Patient will be seen in follow-up appointment in 6 months or earlier if the patient has any concerns    Medication Adjustments/Labs and Tests Ordered: Current medicines are reviewed at length with the patient today.  Concerns regarding medicines are outlined above.  No orders of the defined types were placed in this encounter.  No orders of the defined types were placed in this encounter.    No chief complaint on file.    History of Present Illness:    [name] is a 70 y.o. female.  Patient has past medical  history of nonischemic cardiomyopathy, essential hypertension, dyslipidemia and diabetes mellitus.  She is an ex-smoker.  She mentions to me that she takes care of activities of daily living without any problems.  No chest pain orthopnea or PND.  The patient ejection fraction has not improved in spite of being on guideline directed medical therapy.  At the time of my evaluation, the patient is alert awake oriented and in no distress.  Past Medical History:  Diagnosis Date   Abnormal nuclear stress test 03/29/2020   Acute combined systolic and diastolic heart failure (HCC) 03/09/2020   Atypical Angina pectoris (HCC) 03/19/2020   CAD (coronary artery disease) 04/21/2020   Depression    Diabetes mellitus due to underlying condition with unspecified complications (HCC) 03/09/2020   Diabetes mellitus, type 2 (HCC)    DVT (deep venous thrombosis) (HCC) 07/2011   S/P coumadin x 6 months   Dyspnea on exertion 03/19/2020   HTN (hypertension)    Hyperlipidemia    Hypertension    Hypokalemia 05/08/2012   Hypotension 05/08/2012   Iron deficiency anemia    Iron deficiency anemia, unspecified, symptomatic with dyspnea, dizziness, and shortness of breath with activity 05/08/2012   Leukocytosis 05/08/2012   Nonischemic cardiomyopathy (HCC) 04/21/2020   Tobacco abuse 05/08/2012   Wheezing    Recently put on albuterol, no history of asthma    Past Surgical History:  Procedure Laterality Date   APPENDECTOMY     Bilateral oophrectomy     Secondary  to benign cysts   colonoscopy     with polypectomy   LEFT HEART CATH AND CORONARY ANGIOGRAPHY N/A 03/29/2020   Procedure: LEFT HEART CATH AND CORONARY ANGIOGRAPHY;  Surgeon: Marykay Lex, MD;  Location: [facility] INVASIVE CV LAB;  Service: Cardiovascular;  Laterality: N/A;   TONSILLECTOMY      Current Medications: Current Meds  Medication Sig   Aflibercept 2 MG/0.05ML SOSY Place 1 mg into both eyes every 8 (eight) weeks.   aspirin 81 MG EC tablet Take 81 mg by  mouth every evening.    carboxymethylcellulose (REFRESH PLUS) 0.5 % SOLN Place 1 drop into both eyes 3 (three) times daily as needed for dry eyes (dry eye).   ferrous sulfate 324 (65 Fe) MG TBEC Take 65 mg by mouth 4 (four) times a week.   furosemide (LASIX) 40 MG tablet Take 40 mg by mouth daily.   metFORMIN (GLUCOPHAGE-XR) 500 MG 24 hr tablet Take 500 mg by mouth every evening.    Multiple Vitamin (MULTIVITAMIN) tablet Take 1 tablet by mouth daily. PreserVision Ired 2   nitroGLYCERIN (NITROSTAT) 0.4 MG SL tablet Place 0.4 mg under the tongue every 5 (five) minutes as needed for chest pain.   sacubitril-valsartan (ENTRESTO) 24-26 MG Take 1 tablet by mouth 2 (two) times daily.   simvastatin (ZOCOR) 20 MG tablet Take 20 mg by mouth every evening.      Allergies:   Patient has no known allergies.   Social History   Socioeconomic History   Marital status: Married    Spouse name: Azula Zappia   Number of children: 2   Years of education: Not on file   Highest education level: Not on file  Occupational History   Occupation: Production manager for a company    Employer: [employer]  Tobacco Use   Smoking status: Every Day    Packs/day: 0.50    Years: 40.00    Pack years: 20.00    Types: Cigarettes   Smokeless tobacco: Never  Substance and Sexual Activity   Alcohol use: Yes    Alcohol/week: 15.0 standard drinks    Types: 15 Cans of beer per week   Drug use: No   Sexual activity: Never  Other Topics Concern   Not on file  Social History Narrative   Married.  Lives in husband here in [place].  Normally independent of ADLs.   Social Determinants of Health   Financial Resource Strain: Not on file  Food Insecurity: Not on file  Transportation Needs: Not on file  Physical Activity: Not on file  Stress: Not on file  Social Connections: Not on file     Family History: The patient's family history includes Alcoholism in her father; Breast cancer in her mother; CAD in her paternal  grandmother; COPD in her mother; Diabetes in her maternal grandmother and paternal grandmother; Liver disease in her father; Stroke in her maternal grandfather and paternal grandfather; Uterine cancer in her mother.  ROS:   Please see the history of present illness.    All other systems reviewed and are negative.  EKGs/Labs/Other Studies Reviewed:    The following studies were reviewed today: IMPRESSIONS     1. Left ventricular ejection fraction, by estimation, is 20 to 25%. The  left ventricle has severely decreased function. The left ventricle  demonstrates global hypokinesis. The left ventricular internal cavity size  was moderately dilated. There is mild  concentric left ventricular hypertrophy. Left ventricular diastolic  parameters are consistent with Grade III  diastolic dysfunction  (restrictive).   2. Right ventricular systolic function is normal. The right ventricular  size is normal. There is severely elevated pulmonary artery systolic  pressure.   3. Left atrial size was severely dilated.   4. Right atrial size was severely dilated.   5. A small pericardial effusion is present. The pericardial effusion is  localized near the right atrium. There is no evidence of cardiac  tamponade.   6. The mitral valve is normal in structure. Mild mitral valve  regurgitation. No evidence of mitral stenosis.   7. Tricuspid valve regurgitation is moderate to severe.   8. The aortic valve is normal in structure. Aortic valve regurgitation is  not visualized. Mild aortic valve sclerosis is present, with no evidence  of aortic valve stenosis.   9. The inferior vena cava is normal in size with greater than 50%  respiratory variability, suggesting right atrial pressure of 3 mmHg.    Recent Labs: 03/19/2020: Hemoglobin 18.1; Platelets 186 07/19/2020: BUN 31; Creatinine, Ser 1.22; Potassium 4.3; Sodium 145  Recent Lipid Panel No results found for: CHOL, TRIG, HDL, CHOLHDL, VLDL, LDLCALC,  LDLDIRECT  Physical Exam:    VS:  BP 134/82   Pulse 80   Ht 5\' 4"  (1.626 m)   Wt 168 lb 12.8 oz (76.6 kg)   SpO2 94%   BMI 28.97 kg/m     Wt Readings from Last 3 Encounters:  09/27/20 168 lb 12.8 oz (76.6 kg)  07/19/20 169 lb (76.7 kg)  06/08/20 152 lb 9.6 oz (69.2 kg)     GEN: Patient is in no acute distress HEENT: Normal NECK: No JVD; No carotid bruits LYMPHATICS: No lymphadenopathy CARDIAC: Hear sounds regular, 2/6 systolic murmur at the apex. RESPIRATORY:  Clear to auscultation without rales, wheezing or rhonchi  ABDOMEN: Soft, non-tender, non-distended MUSCULOSKELETAL: Bilateral 2+ edema; No deformity  SKIN: Warm and dry NEUROLOGIC:  Alert and oriented x 3 PSYCHIATRIC:  Normal affect   Signed, 06/10/20, MD  09/27/2020 2:05 PM    [facility]

## 2020-09-27 NOTE — Patient Instructions (Signed)
Medication Instructions:  No medication changes. *If you need a refill on your cardiac medications before your next appointment, please call your pharmacy*   Lab Work: Your physician recommends that you have a BMET. If you have labs (blood work) drawn today and your tests are completely normal, you will receive your results only by: MyChart Message (if you have MyChart) OR A paper copy in the mail If you have any lab test that is abnormal or we need to change your treatment, we will call you to review the results.   Testing/Procedures: None ordered   Follow-Up: At [facility], you and your health needs are our priority.  As part of our continuing mission to provide you with exceptional heart care, we have created designated Provider Care Teams.  These Care Teams include your primary Cardiologist (physician) and Advanced Practice Providers (APPs -  Physician Assistants and Nurse Practitioners) who all work together to provide you with the care you need, when you need it.  We recommend signing up for the patient portal called "MyChart".  Sign up information is provided on this After Visit Summary.  MyChart is used to connect with patients for Virtual Visits (Telemedicine).  Patients are able to view lab/test results, encounter notes, upcoming appointments, etc.  Non-urgent messages can be sent to your provider as well.   To learn more about what you can do with MyChart, go to [email or web address].    Your next appointment:   6 month(s)  The format for your next appointment:   In Person  Provider:   Belva Crome, MD   Other Instructions NA

## 2020-09-28 LAB — BASIC METABOLIC PANEL
BUN/Creatinine Ratio: 23 (ref 12–28)
BUN: 25 mg/dL (ref 8–27)
CO2: 26 mmol/L (ref 20–29)
Calcium: 9.1 mg/dL (ref 8.7–10.3)
Chloride: 105 mmol/L (ref 96–106)
Creatinine, Ser: 1.08 mg/dL — ABNORMAL HIGH (ref 0.57–1.00)
Glucose: 100 mg/dL — ABNORMAL HIGH (ref 65–99)
Potassium: 4 mmol/L (ref 3.5–5.2)
Sodium: 146 mmol/L — ABNORMAL HIGH (ref 134–144)
eGFR: 55 mL/min/{1.73_m2} — ABNORMAL LOW (ref >59)

## 2020-10-05 ENCOUNTER — Telehealth: Payer: Self-pay | Admitting: Cardiology

## 2020-10-05 NOTE — Telephone Encounter (Signed)
Patient called to say that the dr [name] was suppose to send a referral for her to see another dr. Patient was calling to check the status for that. Please advise

## 2020-10-05 NOTE — Addendum Note (Signed)
Addended by: Eleonore Chiquito on: 10/05/2020 01:04 PM   Modules accepted: Orders

## 2020-10-05 NOTE — Telephone Encounter (Signed)
Referral has been placed and pt is aware. 

## 2020-10-08 ENCOUNTER — Other Ambulatory Visit: Payer: Self-pay

## 2020-10-08 ENCOUNTER — Ambulatory Visit (INDEPENDENT_AMBULATORY_CARE_PROVIDER_SITE_OTHER): Payer: Medicare Other | Admitting: Cardiology

## 2020-10-08 ENCOUNTER — Encounter: Payer: Self-pay | Admitting: Cardiology

## 2020-10-08 VITALS — BP 126/62 | HR 89 | Ht 64.0 in | Wt 168.0 lb

## 2020-10-08 DIAGNOSIS — I428 Other cardiomyopathies: Secondary | ICD-10-CM

## 2020-10-08 MED ORDER — SPIRONOLACTONE 25 MG PO TABS: 25.0000 mg | ORAL_TABLET | Freq: Every day | ORAL | 3 refills | Status: DC

## 2020-10-08 MED ORDER — METOPROLOL SUCCINATE ER 50 MG PO TB24: 50.0000 mg | ORAL_TABLET | Freq: Every day | ORAL | 3 refills | Status: DC

## 2020-10-08 NOTE — Patient Instructions (Addendum)
Medication Instructions:  Your physician has recommended you make the following change in your medication:  TAKE Lasix  40 mg TWICE a day for 4 days, then return to normal dosing START Toprol 50 mg once daily RESTART Spironolactone 25 mg once daily  *If you need a refill on your cardiac medications before your next appointment, please call your pharmacy*   Lab Work: None ordered If you have labs (blood work) drawn today and your tests are completely normal, you will receive your results only by: MyChart Message (if you have MyChart) OR A paper copy in the mail If you have any lab test that is abnormal or we need to change your treatment, we will call you to review the results.   Testing/Procedures: Your physician has requested that you have an echocardiogram in 3 months. Echocardiography is a painless test that uses sound waves to create images of your heart. It provides your doctor with information about the size and shape of your heart and how well your heart's chambers and valves are working. This procedure takes approximately one hour. There are no restrictions for this procedure.   Follow-Up: At [facility], you and your health needs are our priority.  As part of our continuing mission to provide you with exceptional heart care, we have created designated Provider Care Teams.  These Care Teams include your primary Cardiologist (physician) and Advanced Practice Providers (APPs -  Physician Assistants and Nurse Practitioners) who all work together to provide you with the care you need, when you need it.  Your next appointment:   2 week(s)  The format for your next appointment:   In Person  Provider:   Belva Crome, MD  Your physician recommends that you schedule a follow-up appointment in: 4 months with Dr. Elberta Fortis  (after you have completed your echocardiogram)    Thank you for choosing [facility]!!   Dory Horn, RN [phone]   Other  Instructions  Discuss your lower extremity swelling with Dr. Tomie China when you see him.  [employer] for [place]:  [phone]

## 2020-10-08 NOTE — Progress Notes (Signed)
Electrophysiology Office Note   Date:  10/08/2020   ID:  [name], DOB 01/01/1951, MRN [id number]  PCP:  [name], MD  Cardiologist:  Revankar Primary Electrophysiologist:  Evart Chung Jorja Loa, MD    Chief Complaint: CHF   History of Present Illness: [name] is a 70 y.o. female who is being seen today for the evaluation of CHF at the request of Revankar, Aundra Dubin, MD. Presenting today for electrophysiology evaluation.  She has a history significant for coronary artery disease, diabetes, hypertension, hyperlipidemia, and chronic systolic heart failure.  She had a left heart catheterization with coronary artery disease, though out of proportion to her level of cardiomyopathy.  She is currently on Entresto.  She had a repeat echo that showed her ejection fraction remains low.  Today, she denies symptoms of palpitations, chest pain, orthopnea, PND, claudication, dizziness, presyncope, syncope, bleeding, or neurologic sequela. The patient is tolerating medications without difficulties.  She has mild shortness of breath.  She also has significant lower extremity edema.  She has had blisters on her legs that are now wrapped.  She is currently going to wound care.   Past Medical History:  Diagnosis Date   Abnormal nuclear stress test 03/29/2020   Acute combined systolic and diastolic heart failure (HCC) 03/09/2020   Atypical Angina pectoris (HCC) 03/19/2020   CAD (coronary artery disease) 04/21/2020   Depression    Diabetes mellitus due to underlying condition with unspecified complications (HCC) 03/09/2020   Diabetes mellitus, type 2 (HCC)    DVT (deep venous thrombosis) (HCC) 07/2011   S/P coumadin x 6 months   Dyspnea on exertion 03/19/2020   HTN (hypertension)    Hyperlipidemia    Hypertension    Hypokalemia 05/08/2012   Hypotension 05/08/2012   Iron deficiency anemia    Iron deficiency anemia, unspecified, symptomatic with dyspnea, dizziness, and shortness  of breath with activity 05/08/2012   Leukocytosis 05/08/2012   Nonischemic cardiomyopathy (HCC) 04/21/2020   Tobacco abuse 05/08/2012   Wheezing    Recently put on albuterol, no history of asthma   Past Surgical History:  Procedure Laterality Date   APPENDECTOMY     Bilateral oophrectomy     Secondary to benign cysts   colonoscopy     with polypectomy   LEFT HEART CATH AND CORONARY ANGIOGRAPHY N/A 03/29/2020   Procedure: LEFT HEART CATH AND CORONARY ANGIOGRAPHY;  Surgeon: Marykay Lex, MD;  Location: [facility] INVASIVE CV LAB;  Service: Cardiovascular;  Laterality: N/A;   TONSILLECTOMY       Current Outpatient Medications  Medication Sig Dispense Refill   Aflibercept 2 MG/0.05ML SOSY Place 1 mg into both eyes every 8 (eight) weeks.     aspirin 81 MG EC tablet Take 81 mg by mouth every evening.      carboxymethylcellulose (REFRESH PLUS) 0.5 % SOLN Place 1 drop into both eyes 3 (three) times daily as needed for dry eyes (dry eye).     ferrous sulfate 324 (65 Fe) MG TBEC Take 65 mg by mouth 4 (four) times a week.     furosemide (LASIX) 40 MG tablet Take 40 mg by mouth daily.     metFORMIN (GLUCOPHAGE-XR) 500 MG 24 hr tablet Take 500 mg by mouth every evening.      metoprolol succinate (TOPROL-XL) 50 MG 24 hr tablet Take 1 tablet (50 mg total) by mouth daily. Take with or immediately following a meal. 30 tablet 3   Multiple Vitamin (MULTIVITAMIN) tablet  Take 1 tablet by mouth daily. PreserVision Ired 2     nitroGLYCERIN (NITROSTAT) 0.4 MG SL tablet Place 0.4 mg under the tongue every 5 (five) minutes as needed for chest pain.     sacubitril-valsartan (ENTRESTO) 24-26 MG Take 1 tablet by mouth 2 (two) times daily.     simvastatin (ZOCOR) 20 MG tablet Take 20 mg by mouth every evening.      spironolactone (ALDACTONE) 25 MG tablet Take 1 tablet (25 mg total) by mouth daily. (Patient not taking: Reported on 10/08/2020) 30 tablet 3   No current facility-administered medications for this visit.     Allergies:   Patient has no allergy information on record.   Social History:  The patient  reports that she has been smoking cigarettes. She has a 20.00 pack-year smoking history. She has never used smokeless tobacco. She reports current alcohol use of about 15.0 standard drinks of alcohol per week. She reports that she does not use drugs.   Family History:  The patient's family history includes Alcoholism in her father; Breast cancer in her mother; CAD in her paternal grandmother; COPD in her mother; Diabetes in her maternal grandmother and paternal grandmother; Liver disease in her father; Stroke in her maternal grandfather and paternal grandfather; Uterine cancer in her mother.    ROS:  Please see the history of present illness.   Otherwise, review of systems is positive for none.   All other systems are reviewed and negative.    PHYSICAL EXAM: VS:  BP 126/62   Pulse 89   Ht 5\' 4"  (1.626 m)   Wt 168 lb (76.2 kg)   BMI 28.84 kg/m  , BMI Body mass index is 28.84 kg/m. GEN: Well nourished, well developed, in no acute distress  HEENT: normal  Neck: no JVD, carotid bruits, or masses Cardiac: RRR; no murmurs, rubs, or gallops, 2+ edema  Respiratory:  clear to auscultation bilaterally, normal work of breathing GI: soft, nontender, nondistended, + BS MS: no deformity or atrophy  Skin: warm and dry Neuro:  Strength and sensation are intact Psych: euthymic mood, full affect  EKG:  EKG is ordered today. Personal review of the ekg ordered shows sinus rhythm, rate 89  Recent Labs: 03/19/2020: Hemoglobin 18.1; Platelets 186 09/27/2020: BUN 25; Creatinine, Ser 1.08; Potassium 4.0; Sodium 146    Lipid Panel  No results found for: CHOL, TRIG, HDL, CHOLHDL, VLDL, LDLCALC, LDLDIRECT   Wt Readings from Last 3 Encounters:  10/08/20 168 lb (76.2 kg)  09/27/20 168 lb 12.8 oz (76.6 kg)  07/19/20 169 lb (76.7 kg)      Other studies Reviewed: Additional studies/ records that were  reviewed today include: TTE 08/13/2020 Review of the above records today demonstrates:   1. Left ventricular ejection fraction, by estimation, is 20 to 25%. The  left ventricle has severely decreased function. The left ventricle  demonstrates global hypokinesis. The left ventricular internal cavity size  was moderately dilated. There is mild  concentric left ventricular hypertrophy. Left ventricular diastolic  parameters are consistent with Grade III diastolic dysfunction  (restrictive).   2. Right ventricular systolic function is normal. The right ventricular  size is normal. There is severely elevated pulmonary artery systolic  pressure.   3. Left atrial size was severely dilated.   4. Right atrial size was severely dilated.   5. A small pericardial effusion is present. The pericardial effusion is  localized near the right atrium. There is no evidence of cardiac  tamponade.  6. The mitral valve is normal in structure. Mild mitral valve  regurgitation. No evidence of mitral stenosis.   7. Tricuspid valve regurgitation is moderate to severe.   8. The aortic valve is normal in structure. Aortic valve regurgitation is  not visualized. Mild aortic valve sclerosis is present, with no evidence  of aortic valve stenosis.   9. The inferior vena cava is normal in size with greater than 50%  respiratory variability, suggesting right atrial pressure of 3 mmHg.   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 There is severe left ventricular systolic dysfunction. The left ventricular ejection fraction is less than 25% by visual estimate. LV end diastolic pressure is moderately elevated - 22 mmHg There is no aortic valve stenosis.  ASSESSMENT AND PLAN:  1.  Chronic systolic heart failure secondary to nonischemic cardiomyopathy: Currently on Entresto.   Unfortunately she has not been tried on optimal medical therapy.  We [name] thus start her on Toprol-XL 50 mg and 25 mg of Aldactone.  Hopefully her blood pressure [name] tolerate this.  We [name] get an echo in 3 months to determine if her ejection fraction has improved.  If not, she would benefit from ICD implant.  She does have significant lower extremity edema and has had blistering which could be due to her edema.  We [name] increase her Lasix to 40 mg twice daily and have her follow-up with her primary cardiologist.  2.  Coronary artery disease: No current chest pain.  Plan per primary cardiology.  3.  Hypertension: Blood pressure well controlled  Case discussed with primary cardiology  Current medicines are reviewed at length with the patient today.   The patient does not have concerns regarding her medicines.  The following changes were made today: Start Toprol-XL, Aldactone, increase Lasix  Labs/ tests ordered today include:  Orders Placed This Encounter  Procedures   EKG 12-Lead   ECHOCARDIOGRAM COMPLETE     Disposition:   FU with [name] 3 months  Signed, Carol Hutmacher Jorja Loa, MD  10/08/2020 11:41 AM     [facility] [address] [place] [address] [phone] (office) [phone] (fax)

## 2020-10-19 ENCOUNTER — Ambulatory Visit: Payer: Medicare Other

## 2020-10-19 ENCOUNTER — Other Ambulatory Visit: Payer: Self-pay

## 2020-10-19 VITALS — BP 126/74 | HR 75 | Ht 64.0 in | Wt 169.0 lb

## 2020-10-19 DIAGNOSIS — I5041 Acute combined systolic (congestive) and diastolic (congestive) heart failure: Secondary | ICD-10-CM

## 2020-10-19 NOTE — Progress Notes (Signed)
Reason for visit: blood pressure check and lab  Name of MD requesting visit: Revankar  H&P: heart failure  ROS related to problem: Recent medication changes   Assessment and plan per MD: Revankar will review and we will notify pt.

## 2020-10-20 LAB — BASIC METABOLIC PANEL
BUN/Creatinine Ratio: 31 — ABNORMAL HIGH (ref 12–28)
BUN: 31 mg/dL — ABNORMAL HIGH (ref 8–27)
CO2: 26 mmol/L (ref 20–29)
Calcium: 8.9 mg/dL (ref 8.7–10.3)
Chloride: 106 mmol/L (ref 96–106)
Creatinine, Ser: 0.99 mg/dL (ref 0.57–1.00)
Glucose: 157 mg/dL — ABNORMAL HIGH (ref 65–99)
Potassium: 4.1 mmol/L (ref 3.5–5.2)
Sodium: 146 mmol/L — ABNORMAL HIGH (ref 134–144)
eGFR: 61 mL/min/{1.73_m2} (ref >59)

## 2020-10-25 ENCOUNTER — Other Ambulatory Visit: Payer: Self-pay

## 2020-10-25 ENCOUNTER — Inpatient Hospital Stay (HOSPITAL_COMMUNITY)
Admission: EM | Admit: 2020-10-25 | Discharge: 2020-10-29 | DRG: 286 | Disposition: A | Discharge status: Home / Self Care | Payer: Medicare Other | Attending: Internal Medicine | Admitting: Internal Medicine

## 2020-10-25 ENCOUNTER — Encounter (HOSPITAL_COMMUNITY): Payer: Self-pay

## 2020-10-25 DIAGNOSIS — L89152 Pressure ulcer of sacral region, stage 2: Secondary | ICD-10-CM | POA: Diagnosis present

## 2020-10-25 DIAGNOSIS — J90 Pleural effusion, not elsewhere classified: Secondary | ICD-10-CM | POA: Diagnosis present

## 2020-10-25 DIAGNOSIS — I130 Hypertensive heart and chronic kidney disease with heart failure and stage 1 through stage 4 chronic kidney disease, or unspecified chronic kidney disease: Secondary | ICD-10-CM | POA: Diagnosis not present

## 2020-10-25 DIAGNOSIS — I2510 Atherosclerotic heart disease of native coronary artery without angina pectoris: Secondary | ICD-10-CM | POA: Diagnosis present

## 2020-10-25 DIAGNOSIS — F17210 Nicotine dependence, cigarettes, uncomplicated: Secondary | ICD-10-CM | POA: Diagnosis present

## 2020-10-25 DIAGNOSIS — Z823 Family history of stroke: Secondary | ICD-10-CM

## 2020-10-25 DIAGNOSIS — Z20822 Contact with and (suspected) exposure to covid-19: Secondary | ICD-10-CM | POA: Diagnosis present

## 2020-10-25 DIAGNOSIS — Z833 Family history of diabetes mellitus: Secondary | ICD-10-CM

## 2020-10-25 DIAGNOSIS — Z79899 Other long term (current) drug therapy: Secondary | ICD-10-CM

## 2020-10-25 DIAGNOSIS — Z8049 Family history of malignant neoplasm of other genital organs: Secondary | ICD-10-CM

## 2020-10-25 DIAGNOSIS — N179 Acute kidney failure, unspecified: Secondary | ICD-10-CM | POA: Diagnosis present

## 2020-10-25 DIAGNOSIS — L8990 Pressure ulcer of unspecified site, unspecified stage: Secondary | ICD-10-CM | POA: Insufficient documentation

## 2020-10-25 DIAGNOSIS — E8779 Other fluid overload: Secondary | ICD-10-CM

## 2020-10-25 DIAGNOSIS — I5043 Acute on chronic combined systolic (congestive) and diastolic (congestive) heart failure: Secondary | ICD-10-CM | POA: Diagnosis present

## 2020-10-25 DIAGNOSIS — R0602 Shortness of breath: Secondary | ICD-10-CM | POA: Diagnosis not present

## 2020-10-25 DIAGNOSIS — E875 Hyperkalemia: Secondary | ICD-10-CM | POA: Diagnosis present

## 2020-10-25 DIAGNOSIS — Z7984 Long term (current) use of oral hypoglycemic drugs: Secondary | ICD-10-CM

## 2020-10-25 DIAGNOSIS — L97829 Non-pressure chronic ulcer of other part of left lower leg with unspecified severity: Secondary | ICD-10-CM | POA: Diagnosis present

## 2020-10-25 DIAGNOSIS — Z825 Family history of asthma and other chronic lower respiratory diseases: Secondary | ICD-10-CM

## 2020-10-25 DIAGNOSIS — N182 Chronic kidney disease, stage 2 (mild): Secondary | ICD-10-CM | POA: Diagnosis present

## 2020-10-25 DIAGNOSIS — Z7982 Long term (current) use of aspirin: Secondary | ICD-10-CM

## 2020-10-25 DIAGNOSIS — I272 Pulmonary hypertension, unspecified: Secondary | ICD-10-CM

## 2020-10-25 DIAGNOSIS — E8770 Fluid overload, unspecified: Secondary | ICD-10-CM

## 2020-10-25 DIAGNOSIS — T502X5A Adverse effect of carbonic-anhydrase inhibitors, benzothiadiazides and other diuretics, initial encounter: Secondary | ICD-10-CM | POA: Diagnosis present

## 2020-10-25 DIAGNOSIS — E876 Hypokalemia: Secondary | ICD-10-CM | POA: Diagnosis present

## 2020-10-25 DIAGNOSIS — I428 Other cardiomyopathies: Secondary | ICD-10-CM | POA: Diagnosis present

## 2020-10-25 DIAGNOSIS — Z803 Family history of malignant neoplasm of breast: Secondary | ICD-10-CM

## 2020-10-25 DIAGNOSIS — R0902 Hypoxemia: Secondary | ICD-10-CM | POA: Diagnosis present

## 2020-10-25 DIAGNOSIS — E785 Hyperlipidemia, unspecified: Secondary | ICD-10-CM | POA: Diagnosis present

## 2020-10-25 DIAGNOSIS — Z8249 Family history of ischemic heart disease and other diseases of the circulatory system: Secondary | ICD-10-CM

## 2020-10-25 DIAGNOSIS — E1122 Type 2 diabetes mellitus with diabetic chronic kidney disease: Secondary | ICD-10-CM | POA: Diagnosis present

## 2020-10-25 DIAGNOSIS — I878 Other specified disorders of veins: Secondary | ICD-10-CM | POA: Diagnosis present

## 2020-10-25 DIAGNOSIS — D509 Iron deficiency anemia, unspecified: Secondary | ICD-10-CM | POA: Diagnosis present

## 2020-10-25 DIAGNOSIS — Z86718 Personal history of other venous thrombosis and embolism: Secondary | ICD-10-CM

## 2020-10-25 DIAGNOSIS — J918 Pleural effusion in other conditions classified elsewhere: Secondary | ICD-10-CM | POA: Diagnosis present

## 2020-10-25 HISTORY — DX: Rheumatic tricuspid insufficiency: I07.1

## 2020-10-25 HISTORY — DX: Nonrheumatic mitral (valve) insufficiency: I34.0

## 2020-10-25 HISTORY — DX: Pericardial effusion (noninflammatory): I31.3

## 2020-10-25 HISTORY — DX: Chronic kidney disease, stage 2 (mild): N18.2

## 2020-10-25 HISTORY — DX: Other pericardial effusion (noninflammatory): I31.39

## 2020-10-25 HISTORY — DX: Chronic combined systolic (congestive) and diastolic (congestive) heart failure: I50.42

## 2020-10-25 HISTORY — DX: Pulmonary hypertension, unspecified: I27.20

## 2020-10-25 NOTE — ED Triage Notes (Signed)
Pt complains of shortness of breath for 3 nights. It occurs whether she is sitting, sleeping, or walking.

## 2020-10-26 ENCOUNTER — Inpatient Hospital Stay (HOSPITAL_COMMUNITY): Payer: Medicare Other

## 2020-10-26 ENCOUNTER — Emergency Department (HOSPITAL_COMMUNITY): Payer: Medicare Other

## 2020-10-26 ENCOUNTER — Encounter (HOSPITAL_COMMUNITY): Payer: Self-pay | Admitting: Family Medicine

## 2020-10-26 DIAGNOSIS — E875 Hyperkalemia: Secondary | ICD-10-CM | POA: Diagnosis present

## 2020-10-26 DIAGNOSIS — L89152 Pressure ulcer of sacral region, stage 2: Secondary | ICD-10-CM | POA: Diagnosis present

## 2020-10-26 DIAGNOSIS — L97829 Non-pressure chronic ulcer of other part of left lower leg with unspecified severity: Secondary | ICD-10-CM | POA: Diagnosis present

## 2020-10-26 DIAGNOSIS — R0902 Hypoxemia: Secondary | ICD-10-CM | POA: Diagnosis present

## 2020-10-26 DIAGNOSIS — E8770 Fluid overload, unspecified: Secondary | ICD-10-CM | POA: Diagnosis not present

## 2020-10-26 DIAGNOSIS — E785 Hyperlipidemia, unspecified: Secondary | ICD-10-CM | POA: Diagnosis present

## 2020-10-26 DIAGNOSIS — Z86718 Personal history of other venous thrombosis and embolism: Secondary | ICD-10-CM | POA: Diagnosis not present

## 2020-10-26 DIAGNOSIS — I5022 Chronic systolic (congestive) heart failure: Secondary | ICD-10-CM | POA: Diagnosis not present

## 2020-10-26 DIAGNOSIS — E876 Hypokalemia: Secondary | ICD-10-CM | POA: Diagnosis present

## 2020-10-26 DIAGNOSIS — I130 Hypertensive heart and chronic kidney disease with heart failure and stage 1 through stage 4 chronic kidney disease, or unspecified chronic kidney disease: Secondary | ICD-10-CM | POA: Diagnosis present

## 2020-10-26 DIAGNOSIS — J918 Pleural effusion in other conditions classified elsewhere: Secondary | ICD-10-CM | POA: Diagnosis present

## 2020-10-26 DIAGNOSIS — I5043 Acute on chronic combined systolic (congestive) and diastolic (congestive) heart failure: Secondary | ICD-10-CM | POA: Diagnosis present

## 2020-10-26 DIAGNOSIS — Z79899 Other long term (current) drug therapy: Secondary | ICD-10-CM | POA: Diagnosis not present

## 2020-10-26 DIAGNOSIS — E1122 Type 2 diabetes mellitus with diabetic chronic kidney disease: Secondary | ICD-10-CM | POA: Diagnosis present

## 2020-10-26 DIAGNOSIS — J90 Pleural effusion, not elsewhere classified: Secondary | ICD-10-CM

## 2020-10-26 DIAGNOSIS — R0602 Shortness of breath: Secondary | ICD-10-CM

## 2020-10-26 DIAGNOSIS — F17210 Nicotine dependence, cigarettes, uncomplicated: Secondary | ICD-10-CM | POA: Diagnosis present

## 2020-10-26 DIAGNOSIS — D509 Iron deficiency anemia, unspecified: Secondary | ICD-10-CM | POA: Diagnosis present

## 2020-10-26 DIAGNOSIS — I428 Other cardiomyopathies: Secondary | ICD-10-CM | POA: Diagnosis present

## 2020-10-26 DIAGNOSIS — I272 Pulmonary hypertension, unspecified: Secondary | ICD-10-CM | POA: Diagnosis present

## 2020-10-26 DIAGNOSIS — N179 Acute kidney failure, unspecified: Secondary | ICD-10-CM | POA: Diagnosis present

## 2020-10-26 DIAGNOSIS — N182 Chronic kidney disease, stage 2 (mild): Secondary | ICD-10-CM | POA: Diagnosis present

## 2020-10-26 DIAGNOSIS — Z20822 Contact with and (suspected) exposure to covid-19: Secondary | ICD-10-CM | POA: Diagnosis present

## 2020-10-26 DIAGNOSIS — Z7984 Long term (current) use of oral hypoglycemic drugs: Secondary | ICD-10-CM | POA: Diagnosis not present

## 2020-10-26 DIAGNOSIS — T502X5A Adverse effect of carbonic-anhydrase inhibitors, benzothiadiazides and other diuretics, initial encounter: Secondary | ICD-10-CM | POA: Diagnosis present

## 2020-10-26 DIAGNOSIS — Z7982 Long term (current) use of aspirin: Secondary | ICD-10-CM | POA: Diagnosis not present

## 2020-10-26 DIAGNOSIS — I2510 Atherosclerotic heart disease of native coronary artery without angina pectoris: Secondary | ICD-10-CM | POA: Diagnosis present

## 2020-10-26 DIAGNOSIS — I878 Other specified disorders of veins: Secondary | ICD-10-CM | POA: Diagnosis present

## 2020-10-26 HISTORY — DX: Shortness of breath: R06.02

## 2020-10-26 HISTORY — DX: Pleural effusion, not elsewhere classified: J90

## 2020-10-26 LAB — COMPREHENSIVE METABOLIC PANEL
ALT: 17 U/L (ref 0–44)
AST: 37 U/L (ref 15–41)
Albumin: 3.4 g/dL — ABNORMAL LOW (ref 3.5–5.0)
Alkaline Phosphatase: 181 U/L — ABNORMAL HIGH (ref 38–126)
Anion gap: 9 (ref 5–15)
BUN: 28 mg/dL — ABNORMAL HIGH (ref 8–23)
CO2: 26 mmol/L (ref 22–32)
Calcium: 8.6 mg/dL — ABNORMAL LOW (ref 8.9–10.3)
Chloride: 108 mmol/L (ref 98–111)
Creatinine, Ser: 1.25 mg/dL — ABNORMAL HIGH (ref 0.44–1.00)
GFR, Estimated: 46 mL/min — ABNORMAL LOW (ref >60)
Glucose, Bld: 140 mg/dL — ABNORMAL HIGH (ref 70–99)
Potassium: 5.5 mmol/L — ABNORMAL HIGH (ref 3.5–5.1)
Sodium: 143 mmol/L (ref 135–145)
Total Bilirubin: 1.8 mg/dL — ABNORMAL HIGH (ref 0.3–1.2)
Total Protein: 6.1 g/dL — ABNORMAL LOW (ref 6.5–8.1)

## 2020-10-26 LAB — CBC WITH DIFFERENTIAL/PLATELET
Abs Immature Granulocytes: 0.01 10*3/uL (ref 0.00–0.07)
Basophils Absolute: 0 10*3/uL (ref 0.0–0.1)
Basophils Relative: 0 %
Eosinophils Absolute: 0 10*3/uL (ref 0.0–0.5)
Eosinophils Relative: 1 %
HCT: 47.7 % — ABNORMAL HIGH (ref 36.0–46.0)
Hemoglobin: 15 g/dL (ref 12.0–15.0)
Immature Granulocytes: 0 %
Lymphocytes Relative: 14 %
Lymphs Abs: 1 10*3/uL (ref 0.7–4.0)
MCH: 31.3 pg (ref 26.0–34.0)
MCHC: 31.4 g/dL (ref 30.0–36.0)
MCV: 99.6 fL (ref 80.0–100.0)
Monocytes Absolute: 0.6 10*3/uL (ref 0.1–1.0)
Monocytes Relative: 8 %
Neutro Abs: 5.4 10*3/uL (ref 1.7–7.7)
Neutrophils Relative %: 77 %
Platelets: 185 10*3/uL (ref 150–400)
RBC: 4.79 MIL/uL (ref 3.87–5.11)
RDW: 16.2 % — ABNORMAL HIGH (ref 11.5–15.5)
WBC: 7 10*3/uL (ref 4.0–10.5)
nRBC: 0 % (ref 0.0–0.2)

## 2020-10-26 LAB — ECHOCARDIOGRAM COMPLETE
Area-P 1/2: 3.48 cm2
Height: 64 in
S' Lateral: 4.4 cm
Single Plane A4C EF: 40.3 %
Weight: 2672 oz

## 2020-10-26 LAB — LACTATE DEHYDROGENASE: LDH: 127 U/L (ref 98–192)

## 2020-10-26 LAB — GLUCOSE, CAPILLARY
Glucose-Capillary: 108 mg/dL — ABNORMAL HIGH (ref 70–99)
Glucose-Capillary: 151 mg/dL — ABNORMAL HIGH (ref 70–99)

## 2020-10-26 LAB — BODY FLUID CELL COUNT WITH DIFFERENTIAL
Eos, Fluid: 0 %
Lymphs, Fluid: 19 %
Monocyte-Macrophage-Serous Fluid: 67 % (ref 50–90)
Neutrophil Count, Fluid: 14 % (ref 0–25)
Total Nucleated Cell Count, Fluid: 492 cu mm (ref 0–1000)

## 2020-10-26 LAB — URINALYSIS, ROUTINE W REFLEX MICROSCOPIC
Bacteria, UA: NONE SEEN
Bilirubin Urine: NEGATIVE
Glucose, UA: NEGATIVE mg/dL
Hgb urine dipstick: NEGATIVE
Ketones, ur: 5 mg/dL — AB
Leukocytes,Ua: NEGATIVE
Nitrite: NEGATIVE
Protein, ur: 100 mg/dL — AB
Specific Gravity, Urine: 1.024 (ref 1.005–1.030)
pH: 5 (ref 5.0–8.0)

## 2020-10-26 LAB — HEMOGLOBIN A1C
Hgb A1c MFr Bld: 6.1 % — ABNORMAL HIGH (ref 4.8–5.6)
Mean Plasma Glucose: 128.37 mg/dL

## 2020-10-26 LAB — PROTEIN / CREATININE RATIO, URINE
Creatinine, Urine: 221.38 mg/dL
Protein Creatinine Ratio: 0.56 mg/mg{Cre} — ABNORMAL HIGH (ref 0.00–0.15)
Total Protein, Urine: 123 mg/dL

## 2020-10-26 LAB — PROTIME-INR
INR: 1.2 (ref 0.8–1.2)
Prothrombin Time: 15.6 seconds — ABNORMAL HIGH (ref 11.4–15.2)

## 2020-10-26 LAB — LACTATE DEHYDROGENASE, PLEURAL OR PERITONEAL FLUID: LD, Fluid: 43 U/L — ABNORMAL HIGH (ref 3–23)

## 2020-10-26 LAB — HIV ANTIBODY (ROUTINE TESTING W REFLEX): HIV Screen 4th Generation wRfx: NONREACTIVE

## 2020-10-26 LAB — CBG MONITORING, ED
Glucose-Capillary: 110 mg/dL — ABNORMAL HIGH (ref 70–99)
Glucose-Capillary: 155 mg/dL — ABNORMAL HIGH (ref 70–99)

## 2020-10-26 LAB — RESP PANEL BY RT-PCR (FLU A&B, COVID) ARPGX2
Influenza A by PCR: NEGATIVE
Influenza B by PCR: NEGATIVE
SARS Coronavirus 2 by RT PCR: NEGATIVE

## 2020-10-26 LAB — TROPONIN I (HIGH SENSITIVITY)
Troponin I (High Sensitivity): 90 ng/L — ABNORMAL HIGH (ref <18)
Troponin I (High Sensitivity): 92 ng/L — ABNORMAL HIGH (ref <18)

## 2020-10-26 LAB — PROTEIN, PLEURAL OR PERITONEAL FLUID: Total protein, fluid: <3 g/dL

## 2020-10-26 LAB — TSH: TSH: 1.443 u[IU]/mL (ref 0.350–4.500)

## 2020-10-26 LAB — BRAIN NATRIURETIC PEPTIDE: B Natriuretic Peptide: >4500 pg/mL — ABNORMAL HIGH (ref 0.0–100.0)

## 2020-10-26 MED ORDER — SODIUM CHLORIDE 0.9% FLUSH
3.0000 mL | Freq: Two times a day (BID) | INTRAVENOUS | Status: DC
  Administered 2020-10-26 – 2020-10-29 (×6): 3 mL via INTRAVENOUS

## 2020-10-26 MED ORDER — SACUBITRIL-VALSARTAN 24-26 MG PO TABS
1.0000 | ORAL_TABLET | Freq: Two times a day (BID) | ORAL | Status: DC
  Administered 2020-10-26 – 2020-10-29 (×6): 1 via ORAL
  Filled 2020-10-26 (×6): qty 1

## 2020-10-26 MED ORDER — FUROSEMIDE 10 MG/ML IJ SOLN
60.0000 mg | Freq: Two times a day (BID) | INTRAMUSCULAR | Status: AC
  Administered 2020-10-26 – 2020-10-28 (×6): 60 mg via INTRAVENOUS
  Filled 2020-10-26 (×2): qty 6
  Filled 2020-10-26: qty 8
  Filled 2020-10-26 (×3): qty 6

## 2020-10-26 MED ORDER — INSULIN ASPART 100 UNIT/ML IJ SOLN
0.0000 [IU] | Freq: Three times a day (TID) | INTRAMUSCULAR | Status: DC
  Administered 2020-10-27 – 2020-10-29 (×2): 1 [IU] via SUBCUTANEOUS
  Filled 2020-10-26: qty 0.09

## 2020-10-26 MED ORDER — INSULIN ASPART 100 UNIT/ML IJ SOLN
0.0000 [IU] | Freq: Every day | INTRAMUSCULAR | Status: DC
  Filled 2020-10-26: qty 0.05

## 2020-10-26 MED ORDER — LIDOCAINE HCL 1 % IJ SOLN
INTRAMUSCULAR | Status: AC
  Filled 2020-10-26: qty 20

## 2020-10-26 MED ORDER — ASPIRIN EC 81 MG PO TBEC
81.0000 mg | DELAYED_RELEASE_TABLET | Freq: Every evening | ORAL | Status: DC
  Administered 2020-10-27 – 2020-10-28 (×2): 81 mg via ORAL
  Filled 2020-10-26 (×2): qty 1

## 2020-10-26 MED ORDER — METOPROLOL SUCCINATE ER 50 MG PO TB24: 50.0000 mg | ORAL_TABLET | Freq: Every day | ORAL | Status: DC

## 2020-10-26 NOTE — Plan of Care (Signed)

## 2020-10-26 NOTE — ED Provider Notes (Signed)
[facility]-EMERGENCY DEPT Provider Note   CSN: [id number] Arrival date & time: 10/25/20  2207     History Chief Complaint  Patient presents with   Shortness of Breath    Carol SmilesKatherine E Haydel is a 70 y.o. female.  The history is provided by the patient and medical records. No language interpreter was used.  Shortness of Breath Severity:  Severe Onset quality:  Gradual Duration:  4 days Timing:  Constant Progression:  Worsening Chronicity:  New Context: not URI   Relieved by:  Sitting up Worsened by:  Exertion Ineffective treatments:  None tried Associated symptoms: no abdominal pain, no chest pain, no cough, no diaphoresis, no fever, no headaches, no neck pain, no rash, no sputum production, no vomiting and no wheezing       Past Medical History:  Diagnosis Date   Abnormal nuclear stress test 03/29/2020   Acute combined systolic and diastolic heart failure (HCC) 03/09/2020   Atypical Angina pectoris (HCC) 03/19/2020   CAD (coronary artery disease) 04/21/2020   Depression    Diabetes mellitus due to underlying condition with unspecified complications (HCC) 03/09/2020   Diabetes mellitus, type 2 (HCC)    DVT (deep venous thrombosis) (HCC) 07/2011   S/P coumadin x 6 months   Dyspnea on exertion 03/19/2020   HTN (hypertension)    Hyperlipidemia    Hypertension    Hypokalemia 05/08/2012   Hypotension 05/08/2012   Iron deficiency anemia    Iron deficiency anemia, unspecified, symptomatic with dyspnea, dizziness, and shortness of breath with activity 05/08/2012   Leukocytosis 05/08/2012   Nonischemic cardiomyopathy (HCC) 04/21/2020   Tobacco abuse 05/08/2012   Wheezing    Recently put on albuterol, no history of asthma    Patient Active Problem List   Diagnosis Date Noted   Wheezing    CAD (coronary artery disease) 04/21/2020   Nonischemic cardiomyopathy (HCC) 04/21/2020   Abnormal nuclear stress test 03/29/2020   Atypical Angina pectoris (HCC)  03/19/2020   Dyspnea on exertion 03/19/2020   Diabetes mellitus due to underlying condition with unspecified complications (HCC) 03/09/2020   Acute combined systolic and diastolic heart failure (HCC) 03/09/2020   Diabetes mellitus, type 2 (HCC)    Hypertension    HTN (hypertension)    Iron deficiency anemia    Iron deficiency anemia, unspecified, symptomatic with dyspnea, dizziness, and shortness of breath with activity 05/08/2012   Hypotension 05/08/2012   Hypokalemia 05/08/2012   Hyperlipidemia 05/08/2012   Depression 05/08/2012   Leukocytosis 05/08/2012   DVT (deep venous thrombosis) (HCC) 07/2011    Past Surgical History:  Procedure Laterality Date   APPENDECTOMY     Bilateral oophrectomy     Secondary to benign cysts   colonoscopy     with polypectomy   LEFT HEART CATH AND CORONARY ANGIOGRAPHY N/A 03/29/2020   Procedure: LEFT HEART CATH AND CORONARY ANGIOGRAPHY;  Surgeon: Marykay LexHarding, David W, MD;  Location: [facility] INVASIVE CV LAB;  Service: Cardiovascular;  Laterality: N/A;   TONSILLECTOMY       OB History   No obstetric history on file.     Family History  Problem Relation Age of Onset   Uterine cancer Mother    Breast cancer Mother    COPD Mother    Alcoholism Father    Liver disease Father    Diabetes Maternal Grandmother    Stroke Maternal Grandfather    CAD Paternal Grandmother    Diabetes Paternal Grandmother    Stroke Paternal Grandfather  Social History   Tobacco Use   Smoking status: Every Day    Packs/day: 0.50    Years: 40.00    Pack years: 20.00    Types: Cigarettes   Smokeless tobacco: Never  Substance Use Topics   Alcohol use: Yes    Alcohol/week: 15.0 standard drinks    Types: 15 Cans of beer per week   Drug use: No    Home Medications Prior to Admission medications   Medication Sig Start Date End Date Taking? Authorizing Provider  Aflibercept 2 MG/0.05ML SOSY Place 1 mg into both eyes every 8 (eight) weeks.    [provider]  aspirin 81 MG EC tablet Take 81 mg by mouth every evening.     [provider]  carboxymethylcellulose (REFRESH PLUS) 0.5 % SOLN Place 1 drop into both eyes 3 (three) times daily as needed for dry eyes (dry eye).    [provider]  ferrous sulfate 324 (65 Fe) MG TBEC Take 65 mg by mouth 4 (four) times a week.    [provider]  furosemide (LASIX) 40 MG tablet Take 40 mg by mouth daily.    [provider]  metFORMIN (GLUCOPHAGE-XR) 500 MG 24 hr tablet Take 500 mg by mouth every evening.  04/03/19   [provider]  metoprolol succinate (TOPROL-XL) 50 MG 24 hr tablet Take 1 tablet (50 mg total) by mouth daily. Take with or immediately following a meal. 10/08/20   Camnitz, Andree Coss, MD  Multiple Vitamin (MULTIVITAMIN) tablet Take 1 tablet by mouth daily. PreserVision Ired 2/Unknown strength    [provider]  nitroGLYCERIN (NITROSTAT) 0.4 MG SL tablet Place 0.4 mg under the tongue every 5 (five) minutes as needed for chest pain.    [provider]  sacubitril-valsartan (ENTRESTO) 24-26 MG Take 1 tablet by mouth 2 (two) times daily.    [provider]  simvastatin (ZOCOR) 20 MG tablet Take 20 mg by mouth every evening.     [provider]  spironolactone (ALDACTONE) 25 MG tablet Take 1 tablet (25 mg total) by mouth daily. 10/08/20   Camnitz, Andree Coss, MD    Allergies    Patient has no allergy information on record.  Review of Systems   Review of Systems  Constitutional:  Positive for fatigue. Negative for chills, diaphoresis and fever.  HENT:  Negative for congestion.   Respiratory:  Positive for chest tightness and shortness of breath. Negative for cough, sputum production and wheezing.   Cardiovascular:  Positive for leg swelling. Negative for chest pain and palpitations.  Gastrointestinal:  Negative for abdominal pain, constipation, diarrhea, nausea and vomiting.  Genitourinary:  Negative for dysuria  and flank pain.  Musculoskeletal:  Negative for back pain, neck pain and neck stiffness.  Skin:  Negative for rash and wound.  Neurological:  Negative for light-headedness and headaches.  Psychiatric/Behavioral:  Negative for agitation.   All other systems reviewed and are negative.  Physical Exam Updated Vital Signs BP 119/78   Pulse 79   Temp 98.4 F (36.9 C) (Oral)   Resp 19   Ht 5\' 4"  (1.626 m)   Wt 75.8 kg   SpO2 93%   BMI 28.67 kg/m   Physical Exam Vitals and nursing note reviewed.  Constitutional:      General: She is not in acute distress.    Appearance: She is well-developed. She is not ill-appearing, toxic-appearing or diaphoretic.  HENT:     Head: Normocephalic and atraumatic.  Eyes:     Conjunctiva/sclera: Conjunctivae normal.     Pupils: Pupils are equal, round, and reactive to light.  Cardiovascular:     Rate and Rhythm: Normal rate and regular rhythm.     Heart sounds: Murmur heard.  Pulmonary:     Effort: Tachypnea present. No respiratory distress.     Breath sounds: No stridor. Rales present. No wheezing or rhonchi.  Chest:     Chest wall: No tenderness.  Abdominal:     Palpations: Abdomen is soft.     Tenderness: There is no abdominal tenderness.  Musculoskeletal:     Cervical back: Neck supple.     Right lower leg: No tenderness. Edema present.     Left lower leg: No tenderness. Edema present.  Skin:    General: Skin is warm and dry.     Capillary Refill: Capillary refill takes less than 2 seconds.     Findings: No erythema.  Neurological:     General: No focal deficit present.     Mental Status: She is alert.      ED Results / Procedures / Treatments   Labs (all labs ordered are listed, but only abnormal results are displayed) Labs Reviewed  CBC WITH DIFFERENTIAL/PLATELET - Abnormal; Notable for the following components:      Result Value   HCT 47.7 (*)    RDW 16.2 (*)    All other components within normal limits  COMPREHENSIVE  METABOLIC PANEL - Abnormal; Notable for the following components:   Potassium 5.5 (*)    Glucose, Bld 140 (*)    BUN 28 (*)    Creatinine, Ser 1.25 (*)    Calcium 8.6 (*)    Total Protein 6.1 (*)    Albumin 3.4 (*)    Alkaline Phosphatase 181 (*)    Total Bilirubin 1.8 (*)    GFR, Estimated 46 (*)    All other components within normal limits  BRAIN NATRIURETIC PEPTIDE - Abnormal; Notable for the following components:   B Natriuretic Peptide >4,500.0 (*)    All other components within normal limits  URINALYSIS, ROUTINE W REFLEX MICROSCOPIC - Abnormal; Notable for the following components:   Color, Urine AMBER (*)    Ketones, ur 5 (*)    Protein, ur 100 (*)    All other components within normal limits  PROTIME-INR - Abnormal; Notable for the following components:   Prothrombin Time 15.6 (*)    All other components within normal limits  PROTEIN / CREATININE RATIO, URINE - Abnormal; Notable for the following components:   Protein Creatinine Ratio 0.56 (*)    All other components within normal limits  TROPONIN I (HIGH SENSITIVITY) - Abnormal; Notable for the following components:   Troponin I (High Sensitivity) 90 (*)    All other components within normal limits  TROPONIN I (HIGH SENSITIVITY) - Abnormal; Notable for the following components:   Troponin I (High Sensitivity) 92 (*)    All other components within normal limits  RESP PANEL BY RT-PCR (FLU A&B, COVID) ARPGX2  HIV ANTIBODY (ROUTINE TESTING W REFLEX)  HEMOGLOBIN A1C    EKG EKG Interpretation  Date/Time:  Tuesday October 26 2020 03:26:08 EDT Ventricular Rate:  77 PR Interval:  143 QRS Duration: 97 QT Interval:  417 QTC Calculation: 472 R Axis:   99 Text Interpretation: Sinus rhythm Anterior infarct, old Minimal ST depression, diffuse leads when compared to prior, more flattened t waves in V5 and V6 No STEMI Confirmed by Jaylend Reiland, Thayer Ohm (  [id number]) on 10/26/2020 7:45:25 AM  Radiology DG Chest 2 View  Result Date:  10/26/2020 CLINICAL DATA:  Shortness of breath EXAM: CHEST - 2 VIEW COMPARISON:  03/03/2020 FINDINGS: New right pleural effusion occupying approximately 50% of the right hemithoracic volume. Right basilar atelectasis. No focal consolidation in the left lung. Mild cardiomegaly with calcific aortic atherosclerosis. IMPRESSION: New right pleural effusion occupying approximately 50% of the right hemithoracic volume. Electronically Signed   By: Deatra Robinson M.D.   On: 10/26/2020 03:23    Procedures Procedures     Medications Ordered in ED Medications  aspirin EC tablet 81 mg (has no administration in time range)  metoprolol succinate (TOPROL-XL) 24 hr tablet 50 mg (has no administration in time range)  furosemide (LASIX) injection 60 mg (has no administration in time range)  insulin aspart (novoLOG) injection 0-5 Units (has no administration in time range)  insulin aspart (novoLOG) injection 0-9 Units (has no administration in time range)    ED Course  I have reviewed the triage vital signs and the nursing notes.  Pertinent labs & imaging results that were available during my care of the patient were reviewed by me and considered in my medical decision making (see chart for details).    MDM Rules/Calculators/A&P                          ELLY HAFFEY is a 70 y.o. female with a past medical history significant for hypertension, hyperlipidemia, remote DVT not on anticoagulation, diabetes, heart failure on diuretics, CAD, and previous anemia who presents with worsening shortness of breath and edema.  Patient reports that she has been on medication of spironolactone and metoprolol for the last few weeks and her symptoms are only worsening.  She is reporting worsened edema in her legs and her abdomen and is more short of breath.  She reports that she cannot catch her breath and cannot lay down.  She reports that she can only when she is sitting nearly straight up.  She reports no chest pain or  abdominal pain.  She denies nausea, vomiting, constipation, diarrhea, or urinary changes.  Denies fevers, chills, ingestion, or cough.  She is only reporting shortness breath.  She reports this does not feel anything like when she had blood clots in the past and does not think that is what is going on.  She is concerned about her fluid primarily.  On exam, lungs have rales throughout.  No rhonchi.  Chest and abdomen are nontender.  Patient is edematous in her lower extremities bilaterally to torso.  Intact pulses however.  Left leg is wrapped due to blisters that are healing.  Clinically I am concerned about CHF exacerbation.  During my conversation, patient's oxygen saturation dropped to 88 with just conversation and without exertion.  Due to the new hypoxia and her lack of oxygen use at home, anticipate she will need admission for diuresis.  We will get screening labs, chest x-ray, and COVID swab.  Will make sure there is not a cardiac cause of symptoms with an EKG, troponin, and we will get a BNP as well.  Given her lack of chest discomfort, tachycardia, or unilateral leg symptoms, patient does not think this is a thromboembolic cause of her symptoms.  She does not want DVT work-up at this time and given the high concern for fluid mediated issues, will agree with her and hold on D-dimer or CT PE study at this time.  Anticipate admission for hypoxia.  Work-up began to return showing evidence of fluid overload.  Chest x-ray shows effusion and fluid.  BNP greater than 4500.  Troponin elevated but not significantly rising.  Suspect demand ischemia in the setting of CHF exacerbation.  As patient is requiring oxygen, she will need admission.  Patient admitted for diuresis and further management.  Final Clinical Impression(s) / ED Diagnoses Final diagnoses:  Hypoxia  Exertional shortness of breath  Hypervolemia, unspecified hypervolemia type     Clinical Impression: 1. Hypoxia   2. Exertional  shortness of breath   3. Hypervolemia, unspecified hypervolemia type   4. Pleural effusion   5. Cardiac volume overload     Disposition: Admit  This note was prepared with assistance of Dragon voice recognition software. Occasional wrong-word or sound-a-like substitutions may have occurred due to the inherent limitations of voice recognition software.     Montzerrat Brunell, Canary Brim, MD 10/26/20 [phone]

## 2020-10-26 NOTE — ED Notes (Signed)
Pt assisted to bathroom to provide ua. Ambulatory.

## 2020-10-26 NOTE — Consult Note (Signed)
WOC Nurse wound consult note Consultation was completed by review of records, images and assistance from the bedside nurse/clinical staff.   Reason for Consult:LLE wound Patient followed by the [place] [facility] wound care center since 10/06/20 with initial presentation of blistering in the presence of edema Wound type: venous stasis  Pressure Injury POA: NA Measurement: see nursing flow sheets (to be added when topical wound care provided) Wound bed; reviewed [facility] notes, clean pink, no signs of infection, more granulation (10/20/20) Drainage (amount, consistency, odor) per notes drainage improving Periwound: intact  Dressing procedure/placement/frequency:  Continue POC established by [facility]; however we do use gensing violet and ultafoam in our system. Will substitute xeroform (antiseptic) coverage and silicone foam.  Change Unna's boot LLE per orthopedic tech Orders updated to change topical care today (per bedside nurse) and to reapply Unna's boot (per orthopedic tech). Bedside nurse will be responsible to coordinate ortho tech when appropriate after orders followed and topical care applied.    Re consult if needed, will not follow at this time. Thanks  Candiace West [employer], RN,CWOCN, CNS, CWON-AP [phone])

## 2020-10-26 NOTE — Plan of Care (Signed)
Patient seen and rounded on in the ER this morning.  Has just been admitted this morning. See H&P for full plan.  Mainly she is severely volume overloaded with severe combined systolic diastolic CHF (EF 53-74%, Gr 3 DD). Anasarca with large right pleural effusion (odd not bilateral as expected, need to follow up cytology as well).  Cardiology consulted for assistance with diuresis; may even benefit from transfer to [facility] for CHF team but will await cardiology recommendations.  No change in plan from H&P.  No charge.   Lewie Chamber, MD [facility] 10/26/2020, 12:40 PM

## 2020-10-26 NOTE — Progress Notes (Signed)
Per d/w cardmaster, right heart cath able to be performed tomorrow @ noon with Dr. Gala Romney. I called into patient's room to review RHC procedure. She is agreeable to proceed and will notify us if she has any additional questions. Discussed plan of care with Dr. Frederick Peers as well. Cardiology team will evaluate after heart cath whether patient would benefit from staying at [facility] for Advanced HF consultation or go back to [facility]. Orders written for cath.  Shared Decision Making/Informed Consent The risks [stroke (1 in 1000), death (1 in 1000),  bleeding (1 in 200), allergic reaction [possibly serious] (1 in 200)], benefits (diagnostic support and management of coronary artery disease) and alternatives of a cardiac catheterization were discussed in detail with Ms. Andrzejewski and she is willing to proceed.  Elyas Villamor PA-C

## 2020-10-26 NOTE — ED Notes (Signed)
Pt given lunch tray. Offers no complaints at this time. MD at bedside

## 2020-10-26 NOTE — ED Notes (Signed)
hospitalist at bedside

## 2020-10-26 NOTE — ED Notes (Addendum)
Pt moved from room 4 to room 24 until she gets a room upstairs. Reconnected to monitor. Given more OJ. Denies any other needs.

## 2020-10-26 NOTE — Plan of Care (Deleted)
Entered in error

## 2020-10-26 NOTE — Progress Notes (Signed)
Orthopedic Tech Progress Note Patient Details:  [name] 29-May-1950 [id number]  Ortho Devices Type of Ortho Device: Roland Rack boot Ortho Device/Splint Location: Left Leg Ortho Device/Splint Interventions: Application   Post Interventions Patient Tolerated: Well  Genelle Bal Oralee Rapaport 10/26/2020, 4:08 PM

## 2020-10-26 NOTE — Progress Notes (Signed)
  Echocardiogram 2D Echocardiogram has been performed.  [name] 10/26/2020, 1:05 PM

## 2020-10-26 NOTE — ED Notes (Signed)
Pt ambulatory to bathroom

## 2020-10-26 NOTE — H&P (Signed)
History and Physical    [name] OHF:[id number] DOB: 1950/08/19 DOA: 10/25/2020  PCP: [name], MD  Patient coming from: home  I have personally briefly reviewed patient's old medical records in [facility]  Chief Complaint: sob  HPI: [name] is [name] 70 y.o. 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  She presents with SOB.  Says she couldn't sleep last 2 nights.  Feels like she's filling [name] bunch of little wooden squares when she tries to take [name] deep breath.  Notes orthopnea.  Progressive worsening abdominal swelling and LE swelling.  Denies CP.    ED Course: Labs, imaging.  Admit to hospitalist for HF exacerbation.   Review of Systems: As per HPI otherwise all other systems reviewed and are negative.  Past Medical History:  Diagnosis Date   Abnormal nuclear stress test 03/29/2020   Acute combined systolic and diastolic heart failure (HCC) 03/09/2020   Atypical Angina pectoris (HCC) 03/19/2020   CAD (coronary artery disease) 04/21/2020   Depression    Diabetes mellitus due to underlying condition with unspecified complications (HCC) 03/09/2020   Diabetes mellitus, type 2 (HCC)    DVT (deep venous thrombosis) (HCC) 07/2011   S/P coumadin x 6 months   Dyspnea on exertion 03/19/2020   HTN (hypertension)    Hyperlipidemia    Hypertension    Hypokalemia 05/08/2012   Hypotension 05/08/2012   Iron deficiency anemia    Iron deficiency anemia, unspecified, symptomatic with dyspnea, dizziness, and shortness of breath with activity 05/08/2012   Leukocytosis 05/08/2012   Nonischemic cardiomyopathy (HCC) 04/21/2020   Tobacco abuse 05/08/2012   Wheezing    Recently put on albuterol, no history of asthma    Past Surgical History:  Procedure Laterality Date    APPENDECTOMY     Bilateral oophrectomy     Secondary to benign cysts   colonoscopy     with polypectomy   LEFT HEART CATH AND CORONARY ANGIOGRAPHY [name] 03/29/2020   Procedure: LEFT HEART CATH AND CORONARY ANGIOGRAPHY;  Surgeon: Marykay Lex, MD;  Location: [facility] INVASIVE CV LAB;  Service: Cardiovascular;  Laterality: N/Marielena Harvell;   TONSILLECTOMY      Social History  reports that she has been smoking cigarettes. She has [name] 20.00 pack-year smoking history. She has never used smokeless tobacco. She reports current alcohol use of about 15.0 standard drinks of alcohol per week. She reports that she does not use drugs.  Not on File  Family History  Problem Relation Age of Onset   Uterine cancer Mother    Breast cancer Mother    COPD Mother    Alcoholism Father    Liver disease Father    Diabetes Maternal Grandmother    Stroke Maternal Grandfather    CAD Paternal Grandmother    Diabetes Paternal Grandmother    Stroke Paternal Grandfather    Prior to Admission medications   Medication Sig Start Date End Date Taking? Authorizing Provider  Aflibercept 2 MG/0.05ML SOSY Place 1 mg into both eyes every 8 (eight) weeks.    [provider]  aspirin 81 MG EC tablet Take 81 mg by mouth every evening.     [provider]  carboxymethylcellulose (REFRESH PLUS) 0.5 % SOLN Place 1 drop into both eyes 3 (three) times daily as needed for dry  eyes (dry eye).    [provider]  ferrous sulfate 324 (65 Fe) MG TBEC Take 65 mg by mouth 4 (four) times [name] week.    [provider]  furosemide (LASIX) 40 MG tablet Take 40 mg by mouth daily.    [provider]  metFORMIN (GLUCOPHAGE-XR) 500 MG 24 hr tablet Take 500 mg by mouth every evening.  04/03/19   [provider]  metoprolol succinate (TOPROL-XL) 50 MG 24 hr tablet Take 1 tablet (50 mg total) by mouth daily. Take with or immediately following [name] meal. 10/08/20   Camnitz, Carol Coss, MD  Multiple Vitamin  (MULTIVITAMIN) tablet Take 1 tablet by mouth daily. PreserVision Ired 2/Unknown strength    [provider]  nitroGLYCERIN (NITROSTAT) 0.4 MG SL tablet Place 0.4 mg under the tongue every 5 (five) minutes as needed for chest pain.    [provider]  sacubitril-valsartan (ENTRESTO) 24-26 MG Take 1 tablet by mouth 2 (two) times daily.    [provider]  simvastatin (ZOCOR) 20 MG tablet Take 20 mg by mouth every evening.     [provider]  spironolactone (ALDACTONE) 25 MG tablet Take 1 tablet (25 mg total) by mouth daily. 10/08/20   Regan Lemming, MD    Physical Exam: Vitals:   10/26/20 0445 10/26/20 0500 10/26/20 0600 10/26/20 0630  BP: (!) 142/92 136/87 (!) 142/92 136/87  Pulse: 78 78 80 81  Resp: (!) 21  (!) 23 (!) 24  Temp:      TempSrc:      SpO2: 96% 97% 99% 98%  Weight:      Height:        Constitutional: NAD, calm, comfortable Vitals:   10/26/20 0445 10/26/20 0500 10/26/20 0600 10/26/20 0630  BP: (!) 142/92 136/87 (!) 142/92 136/87  Pulse: 78 78 80 81  Resp: (!) 21  (!) 23 (!) 24  Temp:      TempSrc:      SpO2: 96% 97% 99% 98%  Weight:      Height:       Eyes: PERRL, lids and conjunctivae normal ENMT: Mucous membranes are moist. Posterior pharynx clear of any exudate or lesions.Normal dentition.  Neck: normal, supple, no masses, no thyromegaly Respiratory: decreased breath sounds on R Cardiovascular: Regular rate and rhythm,. No extremity edema. .  Abdomen: abdominal distension Musculoskeletal: no clubbing / cyanosis.  Skin: no rashes, lesions, ulcers. No induration Neurologic: CN 2-12 grossly intact. Moving all extremities Psychiatric: Normal judgment and insight. Alert and oriented x 3. Normal mood.   Labs on Admission: I have personally reviewed following labs and imaging studies  CBC: Recent Labs  Lab 10/26/20 0335  WBC 7.0  NEUTROABS 5.4  HGB 15.0  HCT 47.7*  MCV 99.6  PLT 185    Basic Metabolic  Panel: Recent Labs  Lab 10/19/20 1120 10/26/20 0335  NA 146* 143  K 4.1 5.5*  CL 106 108  CO2 26 26  GLUCOSE 157* 140*  BUN 31* 28*  CREATININE 0.99 1.25*  CALCIUM 8.9 8.6*    GFR: Estimated Creatinine Clearance: 41.7 mL/min (Maxie Slovacek) (by C-G formula based on SCr of 1.25 mg/dL (H)).  Liver Function Tests: Recent Labs  Lab 10/26/20 0335  AST 37  ALT 17  ALKPHOS 181*  BILITOT 1.8*  PROT 6.1*  ALBUMIN 3.4*    Urine analysis:    Component Value Date/Time   COLORURINE AMBER (Emersynn Deatley) 10/26/2020 0241   APPEARANCEUR CLEAR 10/26/2020 0241  LABSPEC 1.024 10/26/2020 0241   PHURINE 5.0 10/26/2020 0241   GLUCOSEU NEGATIVE 10/26/2020 0241   HGBUR NEGATIVE 10/26/2020 0241   BILIRUBINUR NEGATIVE 10/26/2020 0241   KETONESUR 5 (Hanne Kegg) 10/26/2020 0241   PROTEINUR 100 (Oswin Griffith) 10/26/2020 0241   UROBILINOGEN 0.2 05/08/2012 2018   NITRITE NEGATIVE 10/26/2020 0241   LEUKOCYTESUR NEGATIVE 10/26/2020 0241    Radiological Exams on Admission: DG Chest 2 View  Result Date: 10/26/2020 CLINICAL DATA:  Shortness of breath EXAM: CHEST - 2 VIEW COMPARISON:  03/03/2020 FINDINGS: New right pleural effusion occupying approximately 50% of the right hemithoracic volume. Right basilar atelectasis. No focal consolidation in the left lung. Mild cardiomegaly with calcific aortic atherosclerosis. IMPRESSION: New right pleural effusion occupying approximately 50% of the right hemithoracic volume. Electronically Signed   By: Deatra Robinson M.D.   On: 10/26/2020 03:23    EKG: Independently reviewed. Sinus, appears similar to priors  Assessment/Plan Active Problems:   Pleural effusion   Shortness of breath   Shortness of breath  Right Sided Pleural Effusion  Volume Overload  Combined Systolic and Diastolic Heart Failure  Pulmonary Hypertension Clinically appears overloaded, abdominal distension, lower extremity edema Weight about the same, slightly less than 09/2020 clinic visit BNP elevated Right pleural effusion -  not necessarily expected that this would be unilateral with volume overload Thoracentesis with thora labs Ascites search with abdominal distension UP/C Lasix 60 mg BID IV Hold spironolactone and entresto for now Continue metoprolol Trop elevation flat, likely demand Cardiology c/s - message sent to Trish, please call to confirm  AKI Mild, suspect related to overload  Hyperkalemia Follow repeat with lasix  Left Leg Wound  After fluid blister Wound c/s  T2DM SSI Hold metformin  HLD Holding simvastatin for now  DVT prophylaxis: SCD Code Status:   full  Family Communication:  none Disposition Plan:   Patient is from:  home  Anticipated DC to:  home  Anticipated DC date:  3-5 days  Anticipated DC barriers: Shortness of breath  Consults called:  cardiology Admission status:  unpatient  Severity of Illness: The appropriate patient status for this patient is INPATIENT. Inpatient status is judged to be reasonable and necessary in order to provide the required intensity of service to ensure the patient's safety. The patient's presenting symptoms, physical exam findings, and initial radiographic and laboratory data in the context of their chronic comorbidities is felt to place them at high risk for further clinical deterioration. Furthermore, it is not anticipated that the patient will be medically stable for discharge from the hospital within 2 midnights of admission. The following factors support the patient status of inpatient.   " The patient's presenting symptoms include shortness of breath. " The worrisome physical exam findings include abdominal distension, LE edema. " The initial radiographic and laboratory data are worrisome because of right pleural effusion. " The chronic co-morbidities include heart failure.   * I certify that at the point of admission it is my clinical judgment that the patient will require inpatient hospital care spanning beyond 2 midnights from the  point of admission due to high intensity of service, high risk for further deterioration and high frequency of surveillance required.Lacretia Nicks MD [facility]  How to contact the [facility] Attending or Consulting provider 7A - 7P or covering provider during after hours 7P -7A, for this patient?   Check the care team in [facility] and look for Melenie Minniear) attending/consulting [facility] provider listed and b) the [facility] team listed  Log into [email or web address] and use [facility]'s universal password to access. If you do not have the password, please contact the hospital operator. Locate the [facility] provider you are looking for under [facility] and page to Phillp Dolores number that you can be directly reached. If you still have difficulty reaching the provider, please page the [facility] (Director on Call) for the Hospitalists listed on amion for assistance.  10/26/2020, 7:04 AM

## 2020-10-26 NOTE — Consult Note (Signed)
Cardiology Consultation:   Patient ID: ATLEE VILLERS MRN: [id number]; DOB: 17-Mar-1951  Admit date: 10/25/2020 Date of Consult: 10/26/2020  PCP:  [name], MD   [facility] Providers Cardiologist:  Garwin Brothers, MD  Electrophysiologist:  Regan Lemming, MD       Patient Profile:   [name] is a 70 y.o. 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 Lowell Guitar.  History of Present Illness:   Ms. Kirchman established care with Dr. Tomie China in 02/2020 for dyspnea on exertion suggestive of CHF. Lexiscan nuclear stress test 03/17/20 was very abnormal with severely depressed LVEF prompting cardiac cath 03/29/20 showing mostly angiographically normal coronaries (very small caliber lat 2nd marginal tapered after short aneurysmal segment), LVEF <25%, LVEDP . 2D echo 04/08/20 showed EF 20-25%, global HK, grade 3 DD, mildly reduced RV function, moderately elevated PASP, mild MR, moderate TR. She was started on Entresto. BB initially avoided due to baseline bradycardia. She had a repeat echocardiogram 08/13/20 showing continued LV dysfunction with EF 20-25%, moderately dilated LV, mild LVH, grade 3 diastolic dysfunction, normal RV function but severely elevated PASP, severe bi-atrial enlargement, small pericardial effusion, mild MR, moderate-severe TR. She was referred to Dr. Elberta Fortis for consideration of ICD, 09/27/20 who recommended advancement of GDMT first with addition of Toprol and spironolactone with repeat echo in 3 months. She has since been seen by wound center earlier this month for blistering wounds - had been followed by PCP with [facility] boots.  She presented to the ED with progressive worsening of  SOB, DOE, orthopnea, and PND. She has been unable to sleep the last several nights due to severe shortness of breath waking her up. She also has noticed worsening lower extremity edema. She reports approximately 30lb weight gain over the last 6 months - in January 2022 was 139lb, up to 168 per her report now. She reports taking her medications as prescribed except periodically skips Lasix when she is out and about because of the excessive urination it produced. No chest pain. Here, labs show BNP >4500, hsTroponin 90->92, hyperkalemia of 5.5, mild rise in Cr to 1.25 (previous baseline appears 1-1.2), Tbili 1.8, albumin 3.4, Hgb wnl, sodium 143 (interestingly tends to run borderline hypernatremic per labs), Covid negative. CXR shows new R pleural effusion occupying about 50% of the right hemithoracic volume. IM has ordered thoracentesis as well as abd [place] to eval for ascites. She has been written to start IV Lasix 60mg  BID, just got first dose 1 hour ago and starting to urinate.    Past Medical History:  Diagnosis Date   Abnormal nuclear stress test 03/29/2020   Chronic combined systolic and diastolic CHF (congestive heart failure) (HCC)    CKD (chronic kidney disease), stage II    Depression    Diabetes mellitus, type 2 (HCC)    DVT (deep venous thrombosis) (HCC) 07/2011   S/P coumadin x 6 months   Dyspnea on exertion 03/19/2020   HTN (hypertension)    Hyperlipidemia    Hypertension    Hypokalemia 05/08/2012   Iron deficiency anemia    Leukocytosis 05/08/2012   Mitral regurgitation    Nonischemic cardiomyopathy (HCC) 04/21/2020   Pericardial effusion  small by echo 07/2020   Pulmonary hypertension (HCC)    Tobacco abuse 05/08/2012   Tricuspid regurgitation     Past Surgical History:  Procedure Laterality Date   APPENDECTOMY     Bilateral oophrectomy     Secondary to benign cysts   colonoscopy     with polypectomy   LEFT HEART CATH AND CORONARY ANGIOGRAPHY N/A 03/29/2020    Procedure: LEFT HEART CATH AND CORONARY ANGIOGRAPHY;  Surgeon: Marykay Lex, MD;  Location: [facility] INVASIVE CV LAB;  Service: Cardiovascular;  Laterality: N/A;   TONSILLECTOMY       Home Medications:  Prior to Admission medications   Medication Sig Start Date End Date Taking? Authorizing Provider  Aflibercept 2 MG/0.05ML SOSY Place 1 mg into both eyes every 8 (eight) weeks.    [provider]  aspirin 81 MG EC tablet Take 81 mg by mouth every evening.     [provider]  carboxymethylcellulose (REFRESH PLUS) 0.5 % SOLN Place 1 drop into both eyes 3 (three) times daily as needed for dry eyes (dry eye).    [provider]  ferrous sulfate 324 (65 Fe) MG TBEC Take 65 mg by mouth 4 (four) times a week.    [provider]  furosemide (LASIX) 40 MG tablet Take 40 mg by mouth daily.    [provider]  metFORMIN (GLUCOPHAGE-XR) 500 MG 24 hr tablet Take 500 mg by mouth every evening.  04/03/19   [provider]  metoprolol succinate (TOPROL-XL) 50 MG 24 hr tablet Take 1 tablet (50 mg total) by mouth daily. Take with or immediately following a meal. 10/08/20   Camnitz, Andree Coss, MD  Multiple Vitamin (MULTIVITAMIN) tablet Take 1 tablet by mouth daily. PreserVision Ired 2/Unknown strength    [provider]  nitroGLYCERIN (NITROSTAT) 0.4 MG SL tablet Place 0.4 mg under the tongue every 5 (five) minutes as needed for chest pain.    [provider]  sacubitril-valsartan (ENTRESTO) 24-26 MG Take 1 tablet by mouth 2 (two) times daily.    [provider]  simvastatin (ZOCOR) 20 MG tablet Take 20 mg by mouth every evening.     [provider]  spironolactone (ALDACTONE) 25 MG tablet Take 1 tablet (25 mg total) by mouth daily. 10/08/20   Camnitz, Andree Coss, MD    Inpatient Medications: Scheduled Meds:  aspirin  81 mg Oral QPM   furosemide  60 mg Intravenous BID   insulin aspart  0-5 Units Subcutaneous QHS   insulin  aspart  0-9 Units Subcutaneous TID WC   lidocaine       metoprolol succinate  50 mg Oral Daily   Continuous Infusions:  PRN Meds:   Allergies:   No Known Allergies  Social History:   Social History   Socioeconomic History   Marital status: Married    Spouse name: Brytni Dray   Number of children: 2   Years of education: Not on file   Highest education level: Not on file  Occupational History   Occupation: Production manager for a company    Employer: [employer]  Tobacco Use   Smoking status: Every Day    Packs/day: 0.50    Years: 40.00    Pack years: 20.00    Types: Cigarettes   Smokeless tobacco: Never  Substance and Sexual Activity   Alcohol use: Yes    Alcohol/week: 15.0 standard drinks    Types: 15 Cans of beer per week   Drug use: No  Sexual activity: Never  Other Topics Concern   Not on file  Social History Narrative   Married.  Lives in husband here in [place].  Normally independent of ADLs.   Social Determinants of Health   Financial Resource Strain: Not on file  Food Insecurity: Not on file  Transportation Needs: Not on file  Physical Activity: Not on file  Stress: Not on file  Social Connections: Not on file  Intimate Partner Violence: Not on file    Family History:   Family History  Problem Relation Age of Onset   Uterine cancer Mother    Breast cancer Mother    COPD Mother    Alcoholism Father    Liver disease Father    Diabetes Maternal Grandmother    Stroke Maternal Grandfather    CAD Paternal Grandmother    Diabetes Paternal Grandmother    Stroke Paternal Grandfather      ROS:  Please see the history of present illness.  No fevers, chills, cough, hemoptysis. All other ROS reviewed and negative.     Physical Exam/Data:   Vitals:   10/26/20 0445 10/26/20 0500 10/26/20 0600 10/26/20 0630  BP: (!) 142/92 136/87 (!) 142/92 136/87  Pulse: 78 78 80 81  Resp: (!) 21  (!) 23 (!) 24  Temp:      TempSrc:      SpO2: 96% 97% 99% 98%   Weight:      Height:       No intake or output data in the 24 hours ending 10/26/20 1002 Last 3 Weights 10/25/2020 10/19/2020 10/08/2020  Weight (lbs) 167 lb 169 lb 168 lb  Weight (kg) 75.751 kg 76.658 kg 76.204 kg     Body mass index is 28.67 kg/m.  General: chronically ill appearing WF in no acute distress. Head: Normocephalic, atraumatic, sclera non-icteric, no xanthomas, nares are without discharge. + Exopthalmous appearance. Neck: Negative for carotid bruits. JVP not elevated. Lungs: Diminished on L base, diminished significantly on R lung about 1/2 way up, crackles bilaterally above, no wheezing or rhonchi Heart: Mild tachycardia, S1 S2 without murmurs, rubs, or gallops.  Abdomen: Soft, non-tender, non-distended with normoactive bowel sounds. No rebound/guarding. Extremities: No clubbing or cyanosis but marked taut bilateral lower extremity edema/anasarca noted with wounds dressed Neuro: Alert and oriented X 3. Moves all extremities spontaneously. Psych:  Responds to questions appropriately with a normal affect.  EKG:  The EKG was personally reviewed and demonstrates:  NSR 77bpm, prior anterior infarct pattern, nonspecific STT changes, not significantly changed from prior Telemetry:  Telemetry was personally reviewed and demonstrates:  NSR, rare PVC  Relevant CV Studies: 2D Echo 08/13/20  1. Left ventricular ejection fraction, by estimation, is 20 to 25%. The  left ventricle has severely decreased function. The left ventricle  demonstrates global hypokinesis. The left ventricular internal cavity size  was moderately dilated. There is mild  concentric left ventricular hypertrophy. Left ventricular diastolic  parameters are consistent with Grade III diastolic dysfunction  (restrictive).   2. Right ventricular systolic function is normal. The right ventricular  size is normal. There is severely elevated pulmonary artery systolic  pressure.   3. Left atrial size was severely  dilated.   4. Right atrial size was severely dilated.   5. A small pericardial effusion is present. The pericardial effusion is  localized near the right atrium. There is no evidence of cardiac  tamponade.   6. The mitral valve is normal in structure. Mild mitral valve  regurgitation. No  evidence of mitral stenosis.   7. Tricuspid valve regurgitation is moderate to severe.   8. The aortic valve is normal in structure. Aortic valve regurgitation is  not visualized. Mild aortic valve sclerosis is present, with no evidence  of aortic valve stenosis.   9. The inferior vena cava is normal in size with greater than 50%  respiratory variability, suggesting right atrial pressure of 3 mmHg.  Comparison(s): No significant change from prior study.   LHC 03/2020 Mostly angiographically normal coronary arteries with left dominant system Very small caliber Lat 2nd Mrg lesion tapers after short aneurysmal segment. There is severe left ventricular systolic dysfunction. The left ventricular ejection fraction is less than 25% by visual estimate. LV end diastolic pressure is moderately elevated - 22 mmHg There is no aortic valve stenosis.   SUMMARY Angiographically Normal Coronary Arteries in the Left Coronary System -> very small caliber lateral OM 2 has an almost aneurysmal dilation followed by smooth tapering (would not explain abnormal stress test or reduced EF) Severely reduced LV function with moderately dilated left ventricle and moderate increased left ventricular pressures.   RECOMMENDATIONS Discharge after bedrest I will increase lisinopril dose to 5 mg, but would likely plan to convert to [facility] in the outpatient setting pending cost evaluation. She is also not on a beta-blocker, but did have borderline bradycardia in the Cath Lab. We will give 1 dose of IV Lasix to allow for post-cath hydration, then continue home diuretic. She will need close follow-up with Dr. Tomie China for titration of  Guideline Directed Medical Therapy for Nonischemic Cardiomyopathy   Bryan Lemma, MD    Laboratory Data:  High Sensitivity Troponin:   Recent Labs  Lab 10/26/20 0335 10/26/20 0521  TROPONINIHS 90* 92*     Chemistry Recent Labs  Lab 10/19/20 1120 10/26/20 0335  NA 146* 143  K 4.1 5.5*  CL 106 108  CO2 26 26  GLUCOSE 157* 140*  BUN 31* 28*  CREATININE 0.99 1.25*  CALCIUM 8.9 8.6*  GFRNONAA  --  46*  ANIONGAP  --  9    Recent Labs  Lab 10/26/20 0335  PROT 6.1*  ALBUMIN 3.4*  AST 37  ALT 17  ALKPHOS 181*  BILITOT 1.8*   Hematology Recent Labs  Lab 10/26/20 0335  WBC 7.0  RBC 4.79  HGB 15.0  HCT 47.7*  MCV 99.6  MCH 31.3  MCHC 31.4  RDW 16.2*  PLT 185   BNP Recent Labs  Lab 10/26/20 0335  BNP >4,500.0*    DDimer No results for input(s): DDIMER in the last 168 hours.   Radiology/Studies:  DG Chest 2 View  Result Date: 10/26/2020 CLINICAL DATA:  Shortness of breath EXAM: CHEST - 2 VIEW COMPARISON:  03/03/2020 FINDINGS: New right pleural effusion occupying approximately 50% of the right hemithoracic volume. Right basilar atelectasis. No focal consolidation in the left lung. Mild cardiomegaly with calcific aortic atherosclerosis. IMPRESSION: New right pleural effusion occupying approximately 50% of the right hemithoracic volume. Electronically Signed   By: Deatra Robinson M.D.   On: 10/26/2020 03:23     Assessment and Plan:   1. Acute on chronic combined CHF/NICM - appears markedly volume overloaded, potentially 30+ lbs of fluid on board - just started on IV Lasix, follow closely for clinical response - hold beta blocker due to acute decompensation - spironolactone held due to hyperkalemia - will discuss ongoing Entresto rx with MD - repeat echo (relayed request to expedite echo to Lupita Leash at [facility] who  will reach out to echo tech) - etiology of cardiomyopathy not known at this time - - check TSH given mild exopthalmos on exam, consider cMRI when  optimized to evaluate for infiltrative processes (no speckling reported but does have have biatrial enlargement on echo) - [facility] by [facility] Team  2. Right pleural effusion - agree with thoracentesis, sent message to IM requesting to make sure labs are obtained on pleural fluid  3. Mild AKI superimposed on probable CKD stage II with hyperkalemia - suspect cardiorenal process, follow with IV Lasix - hold spironolactone for now given hyperkalemia - will discuss Entresto rx with MD  4. Severe pulmonary HTN - last echo 07/2020 with severely elevated PASP, question whether this is due to fluid overload or secondary causes given prior substantial tobacco hx - reassess by echo and consider RHC this admission  5. Small pericardial effusion by echo 07/2020 - recheck echo - spoke with echo dept @ [facility] who will notify echo tech to expedite echo  6. Elevated troponin - flat in the 90s, suspect due to demand ischemia in context of severe HF - normal coronaries by recent cath 03/2020  7. HTN -  manage in context above    Risk Assessment/Risk Scores:        New York Heart Association (NYHA) Functional Class NYHA Class IV  For questions or updates, please contact [facility] Please consult [email or web address] for contact info under    Signed, Laurann MontanaDayna N Lovelace Cerveny, PA-C  10/26/2020 10:02 AM

## 2020-10-26 NOTE — Procedures (Signed)
PROCEDURE SUMMARY:  Successful US guided right thoracentesis. Yielded 1.9 L of clear yellow fluid. Pt tolerated procedure well. No immediate complications.  Specimen was sent for labs. CXR ordered.  EBL < 5 mL  Brayton El PA-C 10/26/2020 10:57 AM

## 2020-10-27 ENCOUNTER — Encounter (HOSPITAL_COMMUNITY)
Admission: EM | Disposition: A | Discharge status: Home / Self Care | Payer: Self-pay | Source: Home / Self Care | Attending: Internal Medicine

## 2020-10-27 ENCOUNTER — Encounter (HOSPITAL_COMMUNITY): Payer: Self-pay | Admitting: Internal Medicine

## 2020-10-27 DIAGNOSIS — I272 Pulmonary hypertension, unspecified: Secondary | ICD-10-CM

## 2020-10-27 DIAGNOSIS — E8779 Other fluid overload: Secondary | ICD-10-CM

## 2020-10-27 DIAGNOSIS — L8990 Pressure ulcer of unspecified site, unspecified stage: Secondary | ICD-10-CM | POA: Insufficient documentation

## 2020-10-27 DIAGNOSIS — I5022 Chronic systolic (congestive) heart failure: Secondary | ICD-10-CM

## 2020-10-27 DIAGNOSIS — R0902 Hypoxemia: Secondary | ICD-10-CM

## 2020-10-27 DIAGNOSIS — E8770 Fluid overload, unspecified: Secondary | ICD-10-CM

## 2020-10-27 HISTORY — DX: Pulmonary hypertension, unspecified: I27.20

## 2020-10-27 HISTORY — PX: RIGHT HEART CATH: CATH118263

## 2020-10-27 HISTORY — DX: Pressure ulcer of unspecified site, unspecified stage: L89.90

## 2020-10-27 LAB — POCT I-STAT EG7
Acid-Base Excess: 1 mmol/L (ref 0.0–2.0)
Acid-Base Excess: 5 mmol/L — ABNORMAL HIGH (ref 0.0–2.0)
Acid-Base Excess: 5 mmol/L — ABNORMAL HIGH (ref 0.0–2.0)
Bicarbonate: 27 mmol/L (ref 20.0–28.0)
Bicarbonate: 30.7 mmol/L — ABNORMAL HIGH (ref 20.0–28.0)
Bicarbonate: 31.7 mmol/L — ABNORMAL HIGH (ref 20.0–28.0)
Calcium, Ion: 0.72 mmol/L — CL (ref 1.15–1.40)
Calcium, Ion: 0.97 mmol/L — ABNORMAL LOW (ref 1.15–1.40)
Calcium, Ion: 1.15 mmol/L (ref 1.15–1.40)
HCT: 35 % — ABNORMAL LOW (ref 36.0–46.0)
HCT: 41 % (ref 36.0–46.0)
HCT: 45 % (ref 36.0–46.0)
Hemoglobin: 11.9 g/dL — ABNORMAL LOW (ref 12.0–15.0)
Hemoglobin: 13.9 g/dL (ref 12.0–15.0)
Hemoglobin: 15.3 g/dL — ABNORMAL HIGH (ref 12.0–15.0)
O2 Saturation: 68 %
O2 Saturation: 71 %
O2 Saturation: 72 %
Potassium: 2.5 mmol/L — CL (ref 3.5–5.1)
Potassium: 3.1 mmol/L — ABNORMAL LOW (ref 3.5–5.1)
Potassium: 3.6 mmol/L (ref 3.5–5.1)
Sodium: 145 mmol/L (ref 135–145)
Sodium: 148 mmol/L — ABNORMAL HIGH (ref 135–145)
Sodium: 153 mmol/L — ABNORMAL HIGH (ref 135–145)
TCO2: 28 mmol/L (ref 22–32)
TCO2: 32 mmol/L (ref 22–32)
TCO2: 33 mmol/L — ABNORMAL HIGH (ref 22–32)
pCO2, Ven: 46.1 mmHg (ref 44.0–60.0)
pCO2, Ven: 48.9 mmHg (ref 44.0–60.0)
pCO2, Ven: 52.5 mmHg (ref 44.0–60.0)
pH, Ven: 7.377 (ref 7.250–7.430)
pH, Ven: 7.389 (ref 7.250–7.430)
pH, Ven: 7.406 (ref 7.250–7.430)
pO2, Ven: 36 mmHg (ref 32.0–45.0)
pO2, Ven: 38 mmHg (ref 32.0–45.0)
pO2, Ven: 39 mmHg (ref 32.0–45.0)

## 2020-10-27 LAB — BASIC METABOLIC PANEL
Anion gap: 6 (ref 5–15)
BUN: 28 mg/dL — ABNORMAL HIGH (ref 8–23)
CO2: 28 mmol/L (ref 22–32)
Calcium: 8.2 mg/dL — ABNORMAL LOW (ref 8.9–10.3)
Chloride: 108 mmol/L (ref 98–111)
Creatinine, Ser: 1.14 mg/dL — ABNORMAL HIGH (ref 0.44–1.00)
GFR, Estimated: 52 mL/min — ABNORMAL LOW (ref >60)
Glucose, Bld: 98 mg/dL (ref 70–99)
Potassium: 3.8 mmol/L (ref 3.5–5.1)
Sodium: 142 mmol/L (ref 135–145)

## 2020-10-27 LAB — CBC
HCT: 42.5 % (ref 36.0–46.0)
Hemoglobin: 13.2 g/dL (ref 12.0–15.0)
MCH: 31.2 pg (ref 26.0–34.0)
MCHC: 31.1 g/dL (ref 30.0–36.0)
MCV: 100.5 fL — ABNORMAL HIGH (ref 80.0–100.0)
Platelets: 143 10*3/uL — ABNORMAL LOW (ref 150–400)
RBC: 4.23 MIL/uL (ref 3.87–5.11)
RDW: 16.3 % — ABNORMAL HIGH (ref 11.5–15.5)
WBC: 6.4 10*3/uL (ref 4.0–10.5)
nRBC: 0 % (ref 0.0–0.2)

## 2020-10-27 LAB — GLUCOSE, CAPILLARY
Glucose-Capillary: 99 mg/dL (ref 70–99)
Glucose-Capillary: 106 mg/dL — ABNORMAL HIGH (ref 70–99)
Glucose-Capillary: 170 mg/dL — ABNORMAL HIGH (ref 70–99)
Glucose-Capillary: 149 mg/dL — ABNORMAL HIGH (ref 70–99)
Glucose-Capillary: 118 mg/dL — ABNORMAL HIGH (ref 70–99)

## 2020-10-27 LAB — MAGNESIUM: Magnesium: 2 mg/dL (ref 1.7–2.4)

## 2020-10-27 LAB — CYTOLOGY - NON PAP

## 2020-10-27 SURGERY — RIGHT HEART CATH
Anesthesia: LOCAL

## 2020-10-27 MED ORDER — SODIUM CHLORIDE 0.9 % IV SOLN: INTRAVENOUS | Status: DC

## 2020-10-27 MED ORDER — SPIRONOLACTONE 25 MG PO TABS
25.0000 mg | ORAL_TABLET | Freq: Every day | ORAL | Status: DC
  Administered 2020-10-27 – 2020-10-29 (×3): 25 mg via ORAL
  Filled 2020-10-27 (×3): qty 1

## 2020-10-27 MED ORDER — LIDOCAINE HCL (PF) 1 % IJ SOLN
INTRAMUSCULAR | Status: DC | PRN
  Administered 2020-10-27: 2 mL

## 2020-10-27 MED ORDER — POLYVINYL ALCOHOL 1.4 % OP SOLN: 1.0000 [drp] | Freq: Three times a day (TID) | OPHTHALMIC | Status: DC | PRN

## 2020-10-27 MED ORDER — SODIUM CHLORIDE 0.9 % IV SOLN
INTRAVENOUS | Status: DC
Start: 2020-10-28 — End: 2020-10-27

## 2020-10-27 MED ORDER — LIDOCAINE HCL (PF) 1 % IJ SOLN
INTRAMUSCULAR | Status: AC
  Filled 2020-10-27: qty 30

## 2020-10-27 MED ORDER — ONDANSETRON HCL 4 MG/2ML IJ SOLN: 4.0000 mg | Freq: Four times a day (QID) | INTRAMUSCULAR | Status: DC | PRN

## 2020-10-27 MED ORDER — SODIUM CHLORIDE 0.9% FLUSH
3.0000 mL | Freq: Two times a day (BID) | INTRAVENOUS | Status: DC
  Administered 2020-10-27 – 2020-10-28 (×2): 3 mL via INTRAVENOUS

## 2020-10-27 MED ORDER — SODIUM CHLORIDE 0.9 % IV SOLN: 250.0000 mL | INTRAVENOUS | Status: DC | PRN

## 2020-10-27 MED ORDER — ACETAMINOPHEN 325 MG PO TABS: 650.0000 mg | ORAL_TABLET | ORAL | Status: DC | PRN

## 2020-10-27 MED ORDER — HYDRALAZINE HCL 20 MG/ML IJ SOLN: 10.0000 mg | INTRAMUSCULAR | Status: DC | PRN

## 2020-10-27 MED ORDER — HEPARIN (PORCINE) IN NACL 1000-0.9 UT/500ML-% IV SOLN
INTRAVENOUS | Status: AC
  Filled 2020-10-27: qty 500

## 2020-10-27 MED ORDER — CARBOXYMETHYLCELLULOSE SODIUM 0.5 % OP SOLN: 1.0000 [drp] | Freq: Three times a day (TID) | OPHTHALMIC | Status: DC | PRN

## 2020-10-27 MED ORDER — LABETALOL HCL 5 MG/ML IV SOLN: 10.0000 mg | INTRAVENOUS | Status: DC | PRN

## 2020-10-27 MED ORDER — ENOXAPARIN SODIUM 40 MG/0.4ML IJ SOSY
40.0000 mg | PREFILLED_SYRINGE | INTRAMUSCULAR | Status: DC
  Administered 2020-10-28 – 2020-10-29 (×2): 40 mg via SUBCUTANEOUS
  Filled 2020-10-27 (×2): qty 0.4

## 2020-10-27 MED ORDER — SODIUM CHLORIDE 0.9% FLUSH: 3.0000 mL | INTRAVENOUS | Status: DC | PRN

## 2020-10-27 MED ORDER — HEPARIN (PORCINE) IN NACL 1000-0.9 UT/500ML-% IV SOLN
INTRAVENOUS | Status: DC | PRN
  Administered 2020-10-27: 500 mL

## 2020-10-27 MED ORDER — PROSIGHT PO TABS
1.0000 | ORAL_TABLET | Freq: Every day | ORAL | Status: DC
  Administered 2020-10-27 – 2020-10-29 (×3): 1 via ORAL
  Filled 2020-10-27 (×3): qty 1

## 2020-10-27 SURGICAL SUPPLY — 4 items
CATH BALLN WEDGE 5F 110CM (CATHETERS) ×1 IMPLANT
PACK CARDIAC CATHETERIZATION (CUSTOM PROCEDURE TRAY) ×2 IMPLANT
SHEATH GLIDE SLENDER 4/5FR (SHEATH) ×1 IMPLANT
TRANSDUCER W/STOPCOCK (MISCELLANEOUS) ×2 IMPLANT

## 2020-10-27 NOTE — Progress Notes (Signed)
[facility] arrived to transport patient back to [facility]. Ate [place] sandwich. No c/o discomfort.

## 2020-10-27 NOTE — Progress Notes (Signed)
For right heart cath today. Labs stable except mildly decreased platelet count which will be trended. Hyperkalemia improved, mild renal dysfunction improved, VSS, remains on O2. For RHC today at [facility] at noon with Dr. Gala Romney. I spoke with patient via phone who states she feels so much better ever since fluid was removed from her lung - has also noticed her abdomen does not feel nearly as tight as it had when she came in. Questions answered. The cardiology team will evaluate after cath is done whether patient should remain at [facility] or go back to [facility].

## 2020-10-27 NOTE — Interval H&P Note (Signed)
History and Physical Interval Note:  10/27/2020 11:45 AM  [name]  has presented today for surgery, with the diagnosis of heart failure.  The various methods of treatment have been discussed with the patient and family. After consideration of risks, benefits and other options for treatment, the patient has consented to  Procedure(s): RIGHT HEART CATH (N/A) as a surgical intervention.  The patient's history has been reviewed, patient examined, no change in status, stable for surgery.  I have reviewed the patient's chart and labs.  Questions were answered to the patient's satisfaction.     [name]

## 2020-10-27 NOTE — Progress Notes (Signed)
[name]  FIE:[id number] DOB: 06-01-1950 DOA: 10/25/2020 PCP: [name], MD    Brief Narrative:  70 year old with a history of chronic systolic and diastolic CHF (EF 16-60%/YTKZS 3 DD), DM2, DVT, depression, and HTN who presented to the ED with shortness of breath for 2 nights.  Significant Events:  7/12 admit via [facility] ED 7/12 right thoracentesis producing 1.9 L  Consultants:  Cardiology / Heart Failure Team  Code Status: FULL CODE  Antimicrobials:  None  DVT prophylaxis: SCDs  Subjective: Vital signs stable.  Afebrile.  Saturation 97% but requiring 5 L nasal cannula support.  Patient states she is feeling better overall.  Spoke with patient at bedside at length concerning testing, diagnosis, and treatment plan.  Assessment & Plan:  Acute nonischemic idiopathic combined systolic and diastolic CHF exacerbation -volume overload Continue diuresis -monitor renal function and blood pressure tolerance -await results of right heart cath today  Right pleural effusion Status postthoracentesis 7/12 with 1.9 L drained -significant symptomatic improvement  Acute kidney injury Renal function improving with diuresis -monitor trend  Recent Labs  Lab 10/26/20 0335 10/27/20 0339  CREATININE 1.25* 1.14*    Hypokalemia Due to diuresis -continue to supplement and follow trend -recheck magnesium  Left leg wound Monitor in serial fashion -no concerning findings on exam presently  DM2 CBG well controlled at present  HLD   Family Communication:  Status is: Inpatient  Remains inpatient appropriate because:Inpatient level of care appropriate due to severity of illness  Dispo: The patient is from: Home              Anticipated d/c is to: Home              Patient currently is not medically stable to d/c.   Difficult to place patient No   Objective: Blood pressure 121/73, pulse 75, temperature 98.3 F (36.8 C), temperature source Oral, resp. rate 20,  height 5\' 4"  (1.626 m), weight 71.3 kg, SpO2 97 %.  Intake/Output Summary (Last 24 hours) at 10/27/2020 0927 Last data filed at 10/27/2020 0520 Gross per 24 hour  Intake 6 ml  Output 1600 ml  Net -1594 ml   Filed Weights   10/25/20 2318 10/27/20 0500  Weight: 75.8 kg 71.3 kg    Examination: General: No acute respiratory distress -some pursed lip breathing but not in extremis Lungs: Diffuse fine crackles with no wheezing Cardiovascular: Regular rate and rhythm without murmur gallop or rub normal S1 and S2 Abdomen: Nontender, nondistended, soft, bowel sounds positive, no rebound, no ascites, no appreciable mass Extremities: 2+ pitting edema bilateral lower extremities  CBC: Recent Labs  Lab 10/26/20 0335 10/27/20 0339  WBC 7.0 6.4  NEUTROABS 5.4  --   HGB 15.0 13.2  HCT 47.7* 42.5  MCV 99.6 100.5*  PLT 185 143*   Basic Metabolic Panel: Recent Labs  Lab 10/26/20 0335 10/27/20 0339  NA 143 142  K 5.5* 3.8  CL 108 108  CO2 26 28  GLUCOSE 140* 98  BUN 28* 28*  CREATININE 1.25* 1.14*  CALCIUM 8.6* 8.2*  MG  --  2.0   GFR: Estimated Creatinine Clearance: 44.4 mL/min (A) (by C-G formula based on SCr of 1.14 mg/dL (H)).  Liver Function Tests: Recent Labs  Lab 10/26/20 0335  AST 37  ALT 17  ALKPHOS 181*  BILITOT 1.8*  PROT 6.1*  ALBUMIN 3.4*    Coagulation Profile: Recent Labs  Lab 10/26/20 0335  INR 1.2  HbA1C: Hgb A1c MFr Bld  Date/Time Value Ref Range Status  10/26/2020 08:46 AM 6.1 (H) 4.8 - 5.6 % Final    Comment:    (NOTE) Pre diabetes:          5.7%-6.4%  Diabetes:              >6.4%  Glycemic control for   <7.0% adults with diabetes     CBG: Recent Labs  Lab 10/26/20 0805 10/26/20 1312 10/26/20 1605 10/26/20 2109 10/27/20 0741  GLUCAP 110* 155* 108* 151* 99    Recent Results (from the past 240 hour(s))  Resp Panel by RT-PCR (Flu A&B, Covid) Nasopharyngeal Swab     Status: None   Collection Time: 10/26/20  3:35 AM    Specimen: Nasopharyngeal Swab; Nasopharyngeal(NP) swabs in vial transport medium  Result Value Ref Range Status   SARS Coronavirus 2 by RT PCR NEGATIVE NEGATIVE Final    Comment: (NOTE) SARS-CoV-2 target nucleic acids are NOT DETECTED.  The SARS-CoV-2 RNA is generally detectable in upper respiratory specimens during the acute phase of infection. The lowest concentration of SARS-CoV-2 viral copies this assay can detect is 138 copies/mL. A negative result does not preclude SARS-Cov-2 infection and should not be used as the sole basis for treatment or other patient management decisions. A negative result may occur with  improper specimen collection/handling, submission of specimen other than nasopharyngeal swab, presence of viral mutation(s) within the areas targeted by this assay, and inadequate number of viral copies(<138 copies/mL). A negative result must be combined with clinical observations, patient history, and epidemiological information. The expected result is Negative.  Fact Sheet for Patients:  BloggerCourse.com  Fact Sheet for Healthcare Providers:  SeriousBroker.it  This test is no t yet approved or cleared by the [place] FDA and  has been authorized for detection and/or diagnosis of SARS-CoV-2 by FDA under an Emergency Use Authorization (EUA). This EUA will remain  in effect (meaning this test can be used) for the duration of the COVID-19 declaration under Section 564(b)(1) of the Act, 21 U.S.C.section 360bbb-3(b)(1), unless the authorization is terminated  or revoked sooner.       Influenza A by PCR NEGATIVE NEGATIVE Final   Influenza B by PCR NEGATIVE NEGATIVE Final    Comment: (NOTE) The Xpert Xpress SARS-CoV-2/FLU/RSV plus assay is intended as an aid in the diagnosis of influenza from Nasopharyngeal swab specimens and should not be used as a sole basis for treatment. Nasal washings and aspirates are  unacceptable for Xpert Xpress SARS-CoV-2/FLU/RSV testing.  Fact Sheet for Patients: BloggerCourse.com  Fact Sheet for Healthcare Providers: SeriousBroker.it  This test is not yet approved or cleared by the [place] FDA and has been authorized for detection and/or diagnosis of SARS-CoV-2 by FDA under an Emergency Use Authorization (EUA). This EUA will remain in effect (meaning this test can be used) for the duration of the COVID-19 declaration under Section 564(b)(1) of the Act, 21 U.S.C. section 360bbb-3(b)(1), unless the authorization is terminated or revoked.  Performed at [facility], [address], [place], [place] [address]      Scheduled Meds:  aspirin EC  81 mg Oral QPM   furosemide  60 mg Intravenous BID   insulin aspart  0-5 Units Subcutaneous QHS   insulin aspart  0-9 Units Subcutaneous TID WC   sacubitril-valsartan  1 tablet Oral BID   sodium chloride flush  3 mL Intravenous Q12H   Continuous Infusions:  sodium chloride  sodium chloride       LOS: 1 day   Lonia Blood, MD [facility] Office  [phone] Pager - Text Page per Amion  If 7PM-7AM, please contact night-coverage per Amion 10/27/2020, 9:27 AM

## 2020-10-27 NOTE — Progress Notes (Signed)
 DAILY PROGRESS NOTE   Patient Name: [name] Date of Encounter: 10/27/2020 Cardiologist: Rajan R Revankar, MD  Chief Complaint   Feels much better today  Patient Profile   [name] is a 70 y.o. 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 Powell.  Subjective   Thoracentesis yesterday with 1.9L out - then diuresed another 1.6L urine. Creatinine improved to 1.14 from 1.25. Weight down 4.5 kg. Plan for RHC today with Dr. Bensimhon to evaluate CO and pulmonary hypertension. Pleural fluids was straw-colored and showed mesothial cells- suspect transudative related to CHF, she also lays on the right-side. Occasional PAC's and PVC's overnight. TSH normal.  Objective   Vitals:   10/26/20 1505 10/26/20 1934 10/27/20 0500 10/27/20 0508  BP: 121/73 124/75  121/73  Pulse: 79 76  75  Resp: 20   20  Temp: 97.6 F (36.4 C) 98 F (36.7 C)  98.3 F (36.8 C)  TempSrc: Oral Oral  Oral  SpO2: 100% 100%  97%  Weight:   71.3 kg   Height:        Intake/Output Summary (Last 24 hours) at 10/27/2020 1009 Last data filed at 10/27/2020 0900 Gross per 24 hour  Intake 6 ml  Output 2200 ml  Net -2194 ml   Filed Weights   10/25/20 2318 10/27/20 0500  Weight: 75.8 kg 71.3 kg    Physical Exam   General appearance: alert and no distress Neck: no carotid bruit, no JVD, and thyroid not enlarged, symmetric, no tenderness/mass/nodules Lungs: dullness to percussion RLL Heart: regular rate and rhythm Abdomen: soft, non-tender; bowel sounds normal; no masses,  no organomegaly Extremities: extremities normal, atraumatic, no cyanosis or edema Pulses: 2+ and symmetric Skin: Skin color, texture, turgor normal. No  rashes or lesions Neurologic: Grossly normal Psych: Pleasant  Inpatient Medications    Scheduled Meds:  aspirin EC  81 mg Oral QPM   furosemide  60 mg Intravenous BID   insulin aspart  0-5 Units Subcutaneous QHS   insulin aspart  0-9 Units Subcutaneous TID WC   sacubitril-valsartan  1 tablet Oral BID   sodium chloride flush  3 mL Intravenous Q12H    Continuous Infusions:  sodium chloride     sodium chloride      PRN Meds: sodium chloride, sodium chloride flush   Labs   Results for orders placed or performed during the hospital encounter of 10/25/20 (from the past 48 hour(s))  Urinalysis, Routine w reflex microscopic     Status: Abnormal   Collection Time: 10/26/20  2:41 AM  Result Value Ref Range   Color, Urine AMBER (A) YELLOW    Comment: BIOCHEMICALS MAY BE AFFECTED BY COLOR   APPearance CLEAR CLEAR   Specific Gravity, Urine 1.024 1.005 - 1.030   pH 5.0 5.0 - 8.0   Glucose, UA NEGATIVE NEGATIVE mg/dL   Hgb urine dipstick NEGATIVE NEGATIVE   Bilirubin Urine NEGATIVE NEGATIVE   Ketones, ur 5 (A) NEGATIVE mg/dL   Protein, ur 100 (A) NEGATIVE mg/dL   Nitrite NEGATIVE NEGATIVE   Leukocytes,Ua NEGATIVE NEGATIVE   RBC / HPF 0-5 0 - 5 RBC/hpf   WBC, UA 0-5 0 - 5 WBC/hpf     Bacteria, UA NONE SEEN NONE SEEN   Squamous Epithelial / LPF 0-5 0 - 5   Mucus PRESENT    Hyaline Casts, UA PRESENT     Comment: Performed at [facility], [address] [place], [place] [address]  Protein / creatinine ratio, urine     Status: Abnormal   Collection Time: 10/26/20  2:41 AM  Result Value Ref Range   Creatinine, Urine 221.38 mg/dL   Total Protein, Urine 123 mg/dL    Comment: NO NORMAL RANGE ESTABLISHED FOR THIS TEST   Protein Creatinine Ratio 0.56 (H) 0.00 - 0.15 mg/mg[Cre]    Comment: Performed at [facility], [address] [place], [place] [address]  CBC with Differential     Status: Abnormal   Collection Time: 10/26/20  3:35 AM  Result  Value Ref Range   WBC 7.0 4.0 - 10.5 K/uL   RBC 4.79 3.87 - 5.11 MIL/uL   Hemoglobin 15.0 12.0 - 15.0 g/dL   HCT 47.7 (H) 36.0 - 46.0 %   MCV 99.6 80.0 - 100.0 fL   MCH 31.3 26.0 - 34.0 pg   MCHC 31.4 30.0 - 36.0 g/dL   RDW 16.2 (H) 11.5 - 15.5 %   Platelets 185 150 - 400 K/uL   nRBC 0.0 0.0 - 0.2 %   Neutrophils Relative % 77 %   Neutro Abs 5.4 1.7 - 7.7 K/uL   Lymphocytes Relative 14 %   Lymphs Abs 1.0 0.7 - 4.0 K/uL   Monocytes Relative 8 %   Monocytes Absolute 0.6 0.1 - 1.0 K/uL   Eosinophils Relative 1 %   Eosinophils Absolute 0.0 0.0 - 0.5 K/uL   Basophils Relative 0 %   Basophils Absolute 0.0 0.0 - 0.1 K/uL   Immature Granulocytes 0 %   Abs Immature Granulocytes 0.01 0.00 - 0.07 K/uL    Comment: Performed at [facility], [address] [place], [place] [address]  Comprehensive metabolic panel     Status: Abnormal   Collection Time: 10/26/20  3:35 AM  Result Value Ref Range   Sodium 143 135 - 145 mmol/L   Potassium 5.5 (H) 3.5 - 5.1 mmol/L   Chloride 108 98 - 111 mmol/L   CO2 26 22 - 32 mmol/L   Glucose, Bld 140 (H) 70 - 99 mg/dL    Comment: Glucose reference range applies only to samples taken after fasting for at least 8 hours.   BUN 28 (H) 8 - 23 mg/dL   Creatinine, Ser 1.25 (H) 0.44 - 1.00 mg/dL   Calcium 8.6 (L) 8.9 - 10.3 mg/dL   Total Protein 6.1 (L) 6.5 - 8.1 g/dL   Albumin 3.4 (L) 3.5 - 5.0 g/dL   AST 37 15 - 41 U/L   ALT 17 0 - 44 U/L   Alkaline Phosphatase 181 (H) 38 - 126 U/L   Total Bilirubin 1.8 (H) 0.3 - 1.2 mg/dL   GFR, Estimated 46 (L) >60 mL/min    Comment: (NOTE) Calculated using the CKD-EPI Creatinine Equation (2021)    Anion gap 9 5 - 15    Comment: Performed at [facility], [address] , [place] [address]  Brain natriuretic peptide     Status: Abnormal   Collection Time: 10/26/20  3:35 AM  Result Value Ref Range   B Natriuretic Peptide >4,500.0 (H) 0.0 - 100.0 pg/mL    Comment:  Performed at [facility], [address]   [place], [place] [address]  Troponin I (High Sensitivity)     Status: Abnormal   Collection Time: 10/26/20  3:35 AM  Result Value Ref Range   Troponin I (High Sensitivity) 90 (H) <18 ng/L    Comment: (NOTE) Elevated high sensitivity troponin I (hsTnI) values and significant  changes across serial measurements may suggest ACS but many other  chronic and acute conditions are known to elevate hsTnI results.  Refer to the "Links" section for chest pain algorithms and additional  guidance. Performed at [facility], [address] [place], [place] [address]   Resp Panel by RT-PCR (Flu A&B, Covid) Nasopharyngeal Swab     Status: None   Collection Time: 10/26/20  3:35 AM   Specimen: Nasopharyngeal Swab; Nasopharyngeal(NP) swabs in vial transport medium  Result Value Ref Range   SARS Coronavirus 2 by RT PCR NEGATIVE NEGATIVE    Comment: (NOTE) SARS-CoV-2 target nucleic acids are NOT DETECTED.  The SARS-CoV-2 RNA is generally detectable in upper respiratory specimens during the acute phase of infection. The lowest concentration of SARS-CoV-2 viral copies this assay can detect is 138 copies/mL. A negative result does not preclude SARS-Cov-2 infection and should not be used as the sole basis for treatment or other patient management decisions. A negative result may occur with  improper specimen collection/handling, submission of specimen other than nasopharyngeal swab, presence of viral mutation(s) within the areas targeted by this assay, and inadequate number of viral copies(<138 copies/mL). A negative result must be combined with clinical observations, patient history, and epidemiological information. The expected result is Negative.  Fact Sheet for Patients:  https://www.fda.gov/media/152166/download  Fact Sheet for Healthcare Providers:  https://www.fda.gov/media/152162/download  This test is no t yet  approved or cleared by the [place] FDA and  has been authorized for detection and/or diagnosis of SARS-CoV-2 by FDA under an Emergency Use Authorization (EUA). This EUA will remain  in effect (meaning this test can be used) for the duration of the COVID-19 declaration under Section 564(b)(1) of the Act, 21 U.S.C.section 360bbb-3(b)(1), unless the authorization is terminated  or revoked sooner.       Influenza A by PCR NEGATIVE NEGATIVE   Influenza B by PCR NEGATIVE NEGATIVE    Comment: (NOTE) The Xpert Xpress SARS-CoV-2/FLU/RSV plus assay is intended as an aid in the diagnosis of influenza from Nasopharyngeal swab specimens and should not be used as a sole basis for treatment. Nasal washings and aspirates are unacceptable for Xpert Xpress SARS-CoV-2/FLU/RSV testing.  Fact Sheet for Patients: https://www.fda.gov/media/152166/download  Fact Sheet for Healthcare Providers: https://www.fda.gov/media/152162/download  This test is not yet approved or cleared by the [place] FDA and has been authorized for detection and/or diagnosis of SARS-CoV-2 by FDA under an Emergency Use Authorization (EUA). This EUA will remain in effect (meaning this test can be used) for the duration of the COVID-19 declaration under Section 564(b)(1) of the Act, 21 U.S.C. section 360bbb-3(b)(1), unless the authorization is terminated or revoked.  Performed at [facility], [address] [place], [place] [address]   Protime-INR     Status: Abnormal   Collection Time: 10/26/20  3:35 AM  Result Value Ref Range   Prothrombin Time 15.6 (H) 11.4 - 15.2 seconds   INR 1.2 0.8 - 1.2    Comment: (NOTE) INR goal varies based on device and disease states. Performed at [facility], [address] [place], [place] [address]   Troponin I (High Sensitivity)     Status: Abnormal     Collection Time: 10/26/20  5:21 AM  Result Value Ref Range   Troponin I (High  Sensitivity) 92 (H) <18 ng/L    Comment: (NOTE) Elevated high sensitivity troponin I (hsTnI) values and significant  changes across serial measurements may suggest ACS but many other  chronic and acute conditions are known to elevate hsTnI results.  Refer to the "Links" section for chest pain algorithms and additional  guidance. Performed at [facility], [address] [place], [place] [address]   CBG monitoring, ED     Status: Abnormal   Collection Time: 10/26/20  8:05 AM  Result Value Ref Range   Glucose-Capillary 110 (H) 70 - 99 mg/dL    Comment: Glucose reference range applies only to samples taken after fasting for at least 8 hours.  HIV Antibody (routine testing w rflx)     Status: None   Collection Time: 10/26/20  8:45 AM  Result Value Ref Range   HIV Screen 4th Generation wRfx Non Reactive Non Reactive    Comment: Performed at [facility], [address] [place], [place] [address]  Hemoglobin A1c     Status: Abnormal   Collection Time: 10/26/20  8:46 AM  Result Value Ref Range   Hgb A1c MFr Bld 6.1 (H) 4.8 - 5.6 %    Comment: (NOTE) Pre diabetes:          5.7%-6.4%  Diabetes:              >6.4%  Glycemic control for   <7.0% adults with diabetes    Mean Plasma Glucose 128.37 mg/dL    Comment: Performed at [facility], [address] [place], [place] [address]  Lactate dehydrogenase (pleural or peritoneal fluid)     Status: Abnormal   Collection Time: 10/26/20 10:44 AM  Result Value Ref Range   LD, Fluid 43 (H) 3 - 23 U/L    Comment: (NOTE) Results should be evaluated in conjunction with serum values    Fluid Type-FLDH PLEURAL     Comment: Performed at [facility], [address] [place], [place] [address] CORRECTED ON 07/12 AT 1044: PREVIOUSLY REPORTED AS CYTO PLEU   Body fluid cell count with differential     Status: Abnormal   Collection Time: 10/26/20 10:44 AM  Result Value Ref Range   Fluid Type-FCT  PLEURAL     Comment: CORRECTED ON 07/12 AT 1044: PREVIOUSLY REPORTED AS CYTO PLEU   Color, Fluid YELLOW (A) YELLOW   Appearance, Fluid HAZY (A) CLEAR   Total Nucleated Cell Count, Fluid 492 0 - 1,000 cu mm   Neutrophil Count, Fluid 14 0 - 25 %   Lymphs, Fluid 19 %   Monocyte-Macrophage-Serous Fluid 67 50 - 90 %   Eos, Fluid 0 %   Other Cells, Fluid OTHER CELLS IDENTIFIED AS MESOTHELIAL CELLS %    Comment: CORRELATE WITH CYTOLOGY. Performed at [facility], [address] [place], [place] [address]   Protein, pleural or peritoneal fluid     Status: None   Collection Time: 10/26/20 10:44 AM  Result Value Ref Range   Total protein, fluid <3.0 g/dL    Comment: (NOTE) No normal range established for this test Results should be evaluated in conjunction with serum values    Fluid Type-FTP PLEURAL     Comment: Performed at [facility], [address] [place], [place] [address] CORRECTED ON 07/12 AT 1044: PREVIOUSLY REPORTED AS CYTO PLEU     Body fluid culture w Gram Stain     Status: None (Preliminary result)   Collection Time: 10/26/20 10:45 AM   Specimen: PATH Cytology Pleural fluid  Result Value Ref Range   Specimen Description      PLEURAL Performed at [facility], [address] [place], [place] [address]    Special Requests      NONE Performed at [facility], [address] [place], [place] [address]    Gram Stain PENDING    Culture      NO GROWTH < 24 HOURS Performed at [facility], [address] [place], [place] [address]    Report Status PENDING   CBG monitoring, ED     Status: Abnormal   Collection Time: 10/26/20  1:12 PM  Result Value Ref Range   Glucose-Capillary 155 (H) 70 - 99 mg/dL    Comment: Glucose reference range applies only to samples taken after fasting for at least 8 hours.  TSH     Status: None   Collection Time: 10/26/20  3:58 PM  Result Value Ref Range   TSH  1.443 0.350 - 4.500 uIU/mL    Comment: Performed by a 3rd Generation assay with a functional sensitivity of <=0.01 uIU/mL. Performed at [facility], [address] [place], [place] [address]   Lactate dehydrogenase     Status: None   Collection Time: 10/26/20  3:58 PM  Result Value Ref Range   LDH 127 98 - 192 U/L    Comment: Performed at [facility], [address] [place], [place] [address]  Glucose, capillary     Status: Abnormal   Collection Time: 10/26/20  4:05 PM  Result Value Ref Range   Glucose-Capillary 108 (H) 70 - 99 mg/dL    Comment: Glucose reference range applies only to samples taken after fasting for at least 8 hours.  Glucose, capillary     Status: Abnormal   Collection Time: 10/26/20  9:09 PM  Result Value Ref Range   Glucose-Capillary 151 (H) 70 - 99 mg/dL    Comment: Glucose reference range applies only to samples taken after fasting for at least 8 hours.  Basic metabolic panel     Status: Abnormal   Collection Time: 10/27/20  3:39 AM  Result Value Ref Range   Sodium 142 135 - 145 mmol/L   Potassium 3.8 3.5 - 5.1 mmol/L    Comment: DELTA CHECK NOTED   Chloride 108 98 - 111 mmol/L   CO2 28 22 - 32 mmol/L   Glucose, Bld 98 70 - 99 mg/dL    Comment: Glucose reference range applies only to samples taken after fasting for at least 8 hours.   BUN 28 (H) 8 - 23 mg/dL   Creatinine, Ser 1.14 (H) 0.44 - 1.00 mg/dL   Calcium 8.2 (L) 8.9 - 10.3 mg/dL   GFR, Estimated 52 (L) >60 mL/min    Comment: (NOTE) Calculated using the CKD-EPI Creatinine Equation (2021)    Anion gap 6 5 - 15    Comment: Performed at [facility], [address] [place], [place] [address]  CBC     Status: Abnormal   Collection Time: 10/27/20  3:39 AM  Result Value Ref Range   WBC 6.4 4.0 - 10.5 K/uL   RBC 4.23 3.87 - 5.11 MIL/uL   Hemoglobin 13.2 12.0 - 15.0 g/dL   HCT 42.5 36.0 - 46.0 %   MCV 100.5 (  H) 80.0 - 100.0 fL   MCH 31.2  26.0 - 34.0 pg   MCHC 31.1 30.0 - 36.0 g/dL   RDW 16.3 (H) 11.5 - 15.5 %   Platelets 143 (L) 150 - 400 K/uL   nRBC 0.0 0.0 - 0.2 %    Comment: Performed at [facility], [address] [place], [place] [address]  Magnesium     Status: None   Collection Time: 10/27/20  3:39 AM  Result Value Ref Range   Magnesium 2.0 1.7 - 2.4 mg/dL    Comment: Performed at [facility], [address] [place], [place] [address]  Glucose, capillary     Status: None   Collection Time: 10/27/20  7:41 AM  Result Value Ref Range   Glucose-Capillary 99 70 - 99 mg/dL    Comment: Glucose reference range applies only to samples taken after fasting for at least 8 hours.    ECG   N/A  Telemetry   Sinus with PAC's and PVC's - Personally Reviewed  Radiology    DG Chest 1 View  Result Date: 10/26/2020 CLINICAL DATA:  Post right sided thoracentesis EXAM: CHEST  1 VIEW COMPARISON:  Earlier film the same day. FINDINGS: No pneumothorax. Decrease in right pleural effusion with small residual. Improved aeration at the right lung base. Persistent diffuse interstitial infiltrates or edema bilaterally, appearing marginally improved. Heart size upper limits normal for technique. Aortic Atherosclerosis (ICD10-170.0). Visualized bones unremarkable. IMPRESSION: 1. No pneumothorax post thoracentesis. Electronically Signed   By: D  Hassell M.D.   On: 10/26/2020 11:21   DG Chest 2 View  Result Date: 10/26/2020 CLINICAL DATA:  Shortness of breath EXAM: CHEST - 2 VIEW COMPARISON:  03/03/2020 FINDINGS: New right pleural effusion occupying approximately 50% of the right hemithoracic volume. Right basilar atelectasis. No focal consolidation in the left lung. Mild cardiomegaly with calcific aortic atherosclerosis. IMPRESSION: New right pleural effusion occupying approximately 50% of the right hemithoracic volume. Electronically Signed   By: Kevin  Herman M.D.   On: 10/26/2020 03:23    ECHOCARDIOGRAM COMPLETE  Result Date: 10/26/2020    ECHOCARDIOGRAM REPORT   Patient Name:   Neveyah E Kerkman Date of Exam: 10/26/2020 Medical Rec #:  [id number]          Height:       64.0 in Accession #:    [id number]         Weight:       167.0 lb Date of Birth:  06/17/1950           BSA:          1.812 m Patient Age:    70 years           BP:           127/75 mmHg Patient Gender: F                  HR:           82 bpm. Exam Location:  Inpatient Procedure: 2D Echo, Cardiac Doppler and Color Doppler Indications:    CHF  History:        Patient has prior history of Echocardiogram examinations, most                 recent 03/15/2021. CHF, CAD, Signs/Symptoms:Shortness of Breath                 and Pleural effusion, LE edema; Risk Factors:Hypertension,                   Diabetes, Dyslipidemia and Current Smoker.  Sonographer:    Brooke Strickland Referring Phys: [id number] DAYNA N DUNN IMPRESSIONS  1. Left ventricular ejection fraction, by estimation, is <20%. The left ventricle has severely decreased function. The left ventricle demonstrates global hypokinesis. Left ventricular diastolic parameters are consistent with Grade II diastolic dysfunction (pseudonormalization). Elevated left ventricular end-diastolic pressure.  2. Right ventricular systolic function is moderately reduced. The right ventricular size is normal. There is mildly elevated pulmonary artery systolic pressure. The estimated right ventricular systolic pressure is 43.0 mmHg.  3. Left atrial size was severely dilated.  4. Right atrial size was severely dilated.  5. The mitral valve is normal in structure. Trivial mitral valve regurgitation. No evidence of mitral stenosis.  6. Tricuspid valve regurgitation is mild to moderate.  7. The aortic valve is tricuspid. Aortic valve regurgitation is trivial. Mild aortic valve sclerosis is present, with no evidence of aortic valve stenosis.  8. The inferior vena cava is normal in size with <50% respiratory  variability, suggesting right atrial pressure of 8 mmHg. FINDINGS  Left Ventricle: Left ventricular ejection fraction, by estimation, is <20%. The left ventricle has severely decreased function. The left ventricle demonstrates global hypokinesis. The left ventricular internal cavity size was normal in size. There is no  left ventricular hypertrophy. Left ventricular diastolic parameters are consistent with Grade II diastolic dysfunction (pseudonormalization). Elevated left ventricular end-diastolic pressure. Right Ventricle: The right ventricular size is normal. No increase in right ventricular wall thickness. Right ventricular systolic function is moderately reduced. There is mildly elevated pulmonary artery systolic pressure. The tricuspid regurgitant velocity is 2.96 m/s, and with an assumed right atrial pressure of 8 mmHg, the estimated right ventricular systolic pressure is 43.0 mmHg. Left Atrium: Left atrial size was severely dilated. Right Atrium: Right atrial size was severely dilated. Pericardium: There is no evidence of pericardial effusion. Mitral Valve: The mitral valve is normal in structure. Trivial mitral valve regurgitation. No evidence of mitral valve stenosis. Tricuspid Valve: The tricuspid valve is normal in structure. Tricuspid valve regurgitation is mild to moderate. No evidence of tricuspid stenosis. Aortic Valve: The aortic valve is tricuspid. Aortic valve regurgitation is trivial. Mild aortic valve sclerosis is present, with no evidence of aortic valve stenosis. Pulmonic Valve: The pulmonic valve was normal in structure. Pulmonic valve regurgitation is trivial. No evidence of pulmonic stenosis. Aorta: The aortic root is normal in size and structure. Venous: The inferior vena cava is normal in size with less than 50% respiratory variability, suggesting right atrial pressure of 8 mmHg. IAS/Shunts: No atrial level shunt detected by color flow Doppler.  LEFT VENTRICLE PLAX 2D LVIDd:         4.60  cm      Diastology LVIDs:         4.40 cm      LV e' medial:    3.00 cm/s LV PW:         0.90 cm      LV E/e' medial:  28.0 LV IVS:        1.00 cm      LV e' lateral:   4.20 cm/s LVOT diam:     2.30 cm      LV E/e' lateral: 20.0 LV SV:         63 LV SV Index:   35 LVOT Area:     4.15 cm  LV Volumes (MOD) LV vol d, MOD A4C: 149.0 ml LV vol s, MOD A4C: 89.0 ml LV SV   MOD A4C:     149.0 ml RIGHT VENTRICLE RV Basal diam:  3.70 cm RV S prime:     7.80 cm/s TAPSE (M-mode): 2.2 cm LEFT ATRIUM             Index       RIGHT ATRIUM           Index LA diam:        5.10 cm 2.81 cm/m  RA Area:     26.90 cm LA Vol (A2C):   89.3 ml 49.28 ml/m RA Volume:   98.80 ml  54.52 ml/m LA Vol (A4C):   87.8 ml 48.45 ml/m LA Biplane Vol: 95.8 ml 52.87 ml/m  AORTIC VALVE LVOT Vmax:   86.20 cm/s LVOT Vmean:  49.100 cm/s LVOT VTI:    0.151 m  AORTA Ao Root diam: 3.20 cm MITRAL VALVE               TRICUSPID VALVE MV Area (PHT): 3.48 cm    TR Peak grad:   35.0 mmHg MV Decel Time: 218 msec    TR Vmax:        296.00 cm/s MV E velocity: 84.00 cm/s MV A velocity: 59.10 cm/s  SHUNTS MV E/A ratio:  1.42        Systemic VTI:  0.15 m                            Systemic Diam: 2.30 cm Traci Turner MD Electronically signed by Traci Turner MD Signature Date/Time: 10/26/2020/2:25:43 PM    Final    US ASCITES (ABDOMEN LIMITED)  Result Date: 10/26/2020 CLINICAL DATA:  70-year-old female with history of volume overload. Evaluate for ascites. EXAM: LIMITED ABDOMEN ULTRASOUND FOR ASCITES TECHNIQUE: Limited ultrasound survey for ascites was performed in all four abdominal quadrants. COMPARISON:  None. FINDINGS: Small volume of ascites noted. IMPRESSION: 1. Study is positive for small volume of ascites. Electronically Signed   By: Daniel  Entrikin M.D.   On: 10/26/2020 12:06   US THORACENTESIS ASP PLEURAL SPACE W/IMG GUIDE  Result Date: 10/26/2020 INDICATION: Shortness of breath. Large right pleural effusion. Request diagnostic and therapeutic  thoracentesis. EXAM: ULTRASOUND GUIDED RIGHT THORACENTESIS MEDICATIONS: 1% plain lidocaine, 5 mL COMPLICATIONS: None immediate.  No pneumothorax on follow-up chest radiograph. PROCEDURE: An ultrasound guided thoracentesis was thoroughly discussed with the patient and questions answered. The benefits, risks, alternatives and complications were also discussed. The patient understands and wishes to proceed with the procedure. Written consent was obtained. Ultrasound was performed to localize and mark an adequate pocket of fluid in the right chest. The area was then prepped and draped in the normal sterile fashion. 1% Lidocaine was used for local anesthesia. Under ultrasound guidance a 6 Fr Safe-T-Centesis catheter was introduced. Thoracentesis was performed. The catheter was removed and a dressing applied. FINDINGS: A total of approximately 1.9 L of clear yellow fluid was removed. Samples were sent to the laboratory as requested by the clinical team. IMPRESSION: Successful ultrasound guided right thoracentesis yielding 1.9 L of pleural fluid. Read by: Kevin Bruning PA-C Electronically Signed   By: D  Hassell M.D.   On: 10/26/2020 11:32    Cardiac Studies   See echo result  Assessment   Active Problems:   Pleural effusion   Shortness of breath   Acute on chronic combined systolic and diastolic CHF (congestive heart failure) (HCC)   Pressure injury of skin   Pulmonary hypertension, unspecified (HCC)     Plan   Much improved today after almost 2L thoracentesis and 1.6L urine output - creatinine improved. Plan for RHC today at [facility] with Dr. Bensimhon - may need advanced HF consultation and management  appreciated based on findings. Could remain at [facility] after cath for management if advanced therapies are indicated.   The procedure with Risks/Benefits/Alternatives and Indications was reviewed with the patient Ruchama E. Laskaris.  All questions were answered.    Risks / Complications include, but not  limited to: Death, MI, CVA/TIA, VF/VT (with defibrillation), Bradycardia (need for temporary pacer placement), contrast induced nephropathy, bleeding / bruising / hematoma / pseudoaneurysm, vascular or coronary injury (with possible emergent CT or Vascular Surgery), adverse medication reactions, infection.    The patient voiced understanding of these risks and agrees to proceed.   I have signed the consent form and placed it on the chart for patient signature.    Time Spent Directly with Patient:  I have spent a total of 25 minutes with the patient reviewing hospital notes, telemetry, EKGs, labs and examining the patient as well as establishing an assessment and plan that was discussed personally with the patient.  > 50% of time was spent in direct patient care.  Length of Stay:  LOS: 1 day   Jazzalyn Loewenstein C. Jerame Hedding, MD, FACC, FACP  [place]  Medical Director of the [facility] Diplomate of the American Board of Clinical Lipidology Attending Cardiologist  Direct Dial: [phone]  Fax: [phone]  Website:  [email or web address]  Gloriana Piltz C Lourine Alberico 10/27/2020, 10:09 AM   

## 2020-10-27 NOTE — Progress Notes (Signed)
Pt transported via [facility] to [facility] for [facility].

## 2020-10-27 NOTE — H&P (View-Only) (Signed)
DAILY PROGRESS NOTE   Patient Name: [name] Date of Encounter: 10/27/2020 Cardiologist: Garwin Brothers, MD  Chief Complaint   Feels much better today  Patient Profile   [name] is a 70 y.o. 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 Lowell Guitar.  Subjective   Thoracentesis yesterday with 1.9L out - then diuresed another 1.6L urine. Creatinine improved to 1.14 from 1.25. Weight down 4.5 kg. Plan for RHC today with Dr. Gala Romney to evaluate CO and pulmonary hypertension. Pleural fluids was straw-colored and showed mesothial cells- suspect transudative related to CHF, she also lays on the right-side. Occasional PAC's and PVC's overnight. TSH normal.  Objective   Vitals:   10/26/20 1505 10/26/20 1934 10/27/20 0500 10/27/20 0508  BP: 121/73 124/75  121/73  Pulse: 79 76  75  Resp: 20   20  Temp: 97.6 F (36.4 C) 98 F (36.7 C)  98.3 F (36.8 C)  TempSrc: Oral Oral  Oral  SpO2: 100% 100%  97%  Weight:   71.3 kg   Height:        Intake/Output Summary (Last 24 hours) at 10/27/2020 1009 Last data filed at 10/27/2020 0900 Gross per 24 hour  Intake 6 ml  Output 2200 ml  Net -2194 ml   Filed Weights   10/25/20 2318 10/27/20 0500  Weight: 75.8 kg 71.3 kg    Physical Exam   General appearance: alert and no distress Neck: no carotid bruit, no JVD, and thyroid not enlarged, symmetric, no tenderness/mass/nodules Lungs: dullness to percussion RLL Heart: regular rate and rhythm Abdomen: soft, non-tender; bowel sounds normal; no masses,  no organomegaly Extremities: extremities normal, atraumatic, no cyanosis or edema Pulses: 2+ and symmetric Skin: Skin color, texture, turgor normal. No  rashes or lesions Neurologic: Grossly normal Psych: Pleasant  Inpatient Medications    Scheduled Meds:  aspirin EC  81 mg Oral QPM   furosemide  60 mg Intravenous BID   insulin aspart  0-5 Units Subcutaneous QHS   insulin aspart  0-9 Units Subcutaneous TID WC   sacubitril-valsartan  1 tablet Oral BID   sodium chloride flush  3 mL Intravenous Q12H    Continuous Infusions:  sodium chloride     sodium chloride      PRN Meds: sodium chloride, sodium chloride flush   Labs   Results for orders placed or performed during the hospital encounter of 10/25/20 (from the past 48 hour(s))  Urinalysis, Routine w reflex microscopic     Status: Abnormal   Collection Time: 10/26/20  2:41 AM  Result Value Ref Range   Color, Urine AMBER (A) YELLOW    Comment: BIOCHEMICALS MAY BE AFFECTED BY COLOR   APPearance CLEAR CLEAR   Specific Gravity, Urine 1.024 1.005 - 1.030   pH 5.0 5.0 - 8.0   Glucose, UA NEGATIVE NEGATIVE mg/dL   Hgb urine dipstick NEGATIVE NEGATIVE   Bilirubin Urine NEGATIVE NEGATIVE   Ketones, ur 5 (A) NEGATIVE mg/dL   Protein, ur 161 (A) NEGATIVE mg/dL   Nitrite NEGATIVE NEGATIVE   Leukocytes,Ua NEGATIVE NEGATIVE   RBC / HPF 0-5 0 - 5 RBC/hpf   WBC, UA 0-5 0 - 5 WBC/hpf  Bacteria, UA NONE SEEN NONE SEEN   Squamous Epithelial / LPF 0-5 0 - 5   Mucus PRESENT    Hyaline Casts, UA PRESENT     Comment: Performed at [facility], [address], [place], [place] [address]  Protein / creatinine ratio, urine     Status: Abnormal   Collection Time: 10/26/20  2:41 AM  Result Value Ref Range   Creatinine, Urine 221.38 mg/dL   Total Protein, Urine 123 mg/dL    Comment: NO NORMAL RANGE ESTABLISHED FOR THIS TEST   Protein Creatinine Ratio 0.56 (H) 0.00 - 0.15 mg/mg[Cre]    Comment: Performed at [facility], [address] [place], [place] [address]  CBC with Differential     Status: Abnormal   Collection Time: 10/26/20  3:35 AM  Result  Value Ref Range   WBC 7.0 4.0 - 10.5 K/uL   RBC 4.79 3.87 - 5.11 MIL/uL   Hemoglobin 15.0 12.0 - 15.0 g/dL   HCT 09.8 (H) 11.9 - 14.7 %   MCV 99.6 80.0 - 100.0 fL   MCH 31.3 26.0 - 34.0 pg   MCHC 31.4 30.0 - 36.0 g/dL   RDW 82.9 (H) 56.2 - 13.0 %   Platelets 185 150 - 400 K/uL   nRBC 0.0 0.0 - 0.2 %   Neutrophils Relative % 77 %   Neutro Abs 5.4 1.7 - 7.7 K/uL   Lymphocytes Relative 14 %   Lymphs Abs 1.0 0.7 - 4.0 K/uL   Monocytes Relative 8 %   Monocytes Absolute 0.6 0.1 - 1.0 K/uL   Eosinophils Relative 1 %   Eosinophils Absolute 0.0 0.0 - 0.5 K/uL   Basophils Relative 0 %   Basophils Absolute 0.0 0.0 - 0.1 K/uL   Immature Granulocytes 0 %   Abs Immature Granulocytes 0.01 0.00 - 0.07 K/uL    Comment: Performed at [facility], [address], [place], [place] [address]  Comprehensive metabolic panel     Status: Abnormal   Collection Time: 10/26/20  3:35 AM  Result Value Ref Range   Sodium 143 135 - 145 mmol/L   Potassium 5.5 (H) 3.5 - 5.1 mmol/L   Chloride 108 98 - 111 mmol/L   CO2 26 22 - 32 mmol/L   Glucose, Bld 140 (H) 70 - 99 mg/dL    Comment: Glucose reference range applies only to samples taken after fasting for at least 8 hours.   BUN 28 (H) 8 - 23 mg/dL   Creatinine, Ser 4.69 (H) 0.44 - 1.00 mg/dL   Calcium 8.6 (L) 8.9 - 10.3 mg/dL   Total Protein 6.1 (L) 6.5 - 8.1 g/dL   Albumin 3.4 (L) 3.5 - 5.0 g/dL   AST 37 15 - 41 U/L   ALT 17 0 - 44 U/L   Alkaline Phosphatase 181 (H) 38 - 126 U/L   Total Bilirubin 1.8 (H) 0.3 - 1.2 mg/dL   GFR, Estimated 46 (L) >60 mL/min    Comment: (NOTE) Calculated using the CKD-EPI Creatinine Equation (2021)    Anion gap 9 5 - 15    Comment: Performed at [facility], [address], [place], [place] [address]  Brain natriuretic peptide     Status: Abnormal   Collection Time: 10/26/20  3:35 AM  Result Value Ref Range   B Natriuretic Peptide >4,500.0 (H) 0.0 - 100.0 pg/mL    Comment:  Performed at [facility], [address]  [place], [place] [address]  Troponin I (High Sensitivity)     Status: Abnormal   Collection Time: 10/26/20  3:35 AM  Result Value Ref Range   Troponin I (High Sensitivity) 90 (H) <18 ng/L    Comment: (NOTE) Elevated high sensitivity troponin I (hsTnI) values and significant  changes across serial measurements may suggest ACS but many other  chronic and acute conditions are known to elevate hsTnI results.  Refer to the "Links" section for chest pain algorithms and additional  guidance. Performed at [facility], [address], [place], [place] [address]   Resp Panel by RT-PCR (Flu A&B, Covid) Nasopharyngeal Swab     Status: None   Collection Time: 10/26/20  3:35 AM   Specimen: Nasopharyngeal Swab; Nasopharyngeal(NP) swabs in vial transport medium  Result Value Ref Range   SARS Coronavirus 2 by RT PCR NEGATIVE NEGATIVE    Comment: (NOTE) SARS-CoV-2 target nucleic acids are NOT DETECTED.  The SARS-CoV-2 RNA is generally detectable in upper respiratory specimens during the acute phase of infection. The lowest concentration of SARS-CoV-2 viral copies this assay can detect is 138 copies/mL. A negative result does not preclude SARS-Cov-2 infection and should not be used as the sole basis for treatment or other patient management decisions. A negative result may occur with  improper specimen collection/handling, submission of specimen other than nasopharyngeal swab, presence of viral mutation(s) within the areas targeted by this assay, and inadequate number of viral copies(<138 copies/mL). A negative result must be combined with clinical observations, patient history, and epidemiological information. The expected result is Negative.  Fact Sheet for Patients:  BloggerCourse.com  Fact Sheet for Healthcare Providers:  SeriousBroker.it  This test is no t yet  approved or cleared by the [place] FDA and  has been authorized for detection and/or diagnosis of SARS-CoV-2 by FDA under an Emergency Use Authorization (EUA). This EUA will remain  in effect (meaning this test can be used) for the duration of the COVID-19 declaration under Section 564(b)(1) of the Act, 21 U.S.C.section 360bbb-3(b)(1), unless the authorization is terminated  or revoked sooner.       Influenza A by PCR NEGATIVE NEGATIVE   Influenza B by PCR NEGATIVE NEGATIVE    Comment: (NOTE) The Xpert Xpress SARS-CoV-2/FLU/RSV plus assay is intended as an aid in the diagnosis of influenza from Nasopharyngeal swab specimens and should not be used as a sole basis for treatment. Nasal washings and aspirates are unacceptable for Xpert Xpress SARS-CoV-2/FLU/RSV testing.  Fact Sheet for Patients: BloggerCourse.com  Fact Sheet for Healthcare Providers: SeriousBroker.it  This test is not yet approved or cleared by the [place] FDA and has been authorized for detection and/or diagnosis of SARS-CoV-2 by FDA under an Emergency Use Authorization (EUA). This EUA will remain in effect (meaning this test can be used) for the duration of the COVID-19 declaration under Section 564(b)(1) of the Act, 21 U.S.C. section 360bbb-3(b)(1), unless the authorization is terminated or revoked.  Performed at [facility], [address], [place], [place] [address]   Protime-INR     Status: Abnormal   Collection Time: 10/26/20  3:35 AM  Result Value Ref Range   Prothrombin Time 15.6 (H) 11.4 - 15.2 seconds   INR 1.2 0.8 - 1.2    Comment: (NOTE) INR goal varies based on device and disease states. Performed at [facility], [address], [place], [place] [address]   Troponin I (High Sensitivity)     Status: Abnormal  Collection Time: 10/26/20  5:21 AM  Result Value Ref Range   Troponin I (High  Sensitivity) 92 (H) <18 ng/L    Comment: (NOTE) Elevated high sensitivity troponin I (hsTnI) values and significant  changes across serial measurements may suggest ACS but many other  chronic and acute conditions are known to elevate hsTnI results.  Refer to the "Links" section for chest pain algorithms and additional  guidance. Performed at [facility], [address], [place], [place] [address]   CBG monitoring, ED     Status: Abnormal   Collection Time: 10/26/20  8:05 AM  Result Value Ref Range   Glucose-Capillary 110 (H) 70 - 99 mg/dL    Comment: Glucose reference range applies only to samples taken after fasting for at least 8 hours.  HIV Antibody (routine testing w rflx)     Status: None   Collection Time: 10/26/20  8:45 AM  Result Value Ref Range   HIV Screen 4th Generation wRfx Non Reactive Non Reactive    Comment: Performed at [facility], [address] [place], [place] [address]  Hemoglobin A1c     Status: Abnormal   Collection Time: 10/26/20  8:46 AM  Result Value Ref Range   Hgb A1c MFr Bld 6.1 (H) 4.8 - 5.6 %    Comment: (NOTE) Pre diabetes:          5.7%-6.4%  Diabetes:              >6.4%  Glycemic control for   <7.0% adults with diabetes    Mean Plasma Glucose 128.37 mg/dL    Comment: Performed at [facility], [address], [place], [place] [address]  Lactate dehydrogenase (pleural or peritoneal fluid)     Status: Abnormal   Collection Time: 10/26/20 10:44 AM  Result Value Ref Range   LD, Fluid 43 (H) 3 - 23 U/L    Comment: (NOTE) Results should be evaluated in conjunction with serum values    Fluid Type-FLDH PLEURAL     Comment: Performed at [facility], [address] [place], [place] [address] CORRECTED ON 07/12 AT 1044: PREVIOUSLY REPORTED AS CYTO PLEU   Body fluid cell count with differential     Status: Abnormal   Collection Time: 10/26/20 10:44 AM  Result Value Ref Range   Fluid Type-FCT  PLEURAL     Comment: CORRECTED ON 07/12 AT 1044: PREVIOUSLY REPORTED AS CYTO PLEU   Color, Fluid YELLOW (A) YELLOW   Appearance, Fluid HAZY (A) CLEAR   Total Nucleated Cell Count, Fluid 492 0 - 1,000 cu mm   Neutrophil Count, Fluid 14 0 - 25 %   Lymphs, Fluid 19 %   Monocyte-Macrophage-Serous Fluid 67 50 - 90 %   Eos, Fluid 0 %   Other Cells, Fluid OTHER CELLS IDENTIFIED AS MESOTHELIAL CELLS %    Comment: CORRELATE WITH CYTOLOGY. Performed at [facility], [address] [place], [place] [address]   Protein, pleural or peritoneal fluid     Status: None   Collection Time: 10/26/20 10:44 AM  Result Value Ref Range   Total protein, fluid <3.0 g/dL    Comment: (NOTE) No normal range established for this test Results should be evaluated in conjunction with serum values    Fluid Type-FTP PLEURAL     Comment: Performed at [facility], [address] [place], [place] [address] CORRECTED ON 07/12 AT 1044: PREVIOUSLY REPORTED AS CYTO PLEU  Body fluid culture w Gram Stain     Status: None (Preliminary result)   Collection Time: 10/26/20 10:45 AM   Specimen: PATH Cytology Pleural fluid  Result Value Ref Range   Specimen Description      PLEURAL Performed at [facility], [address] [place], [place] [address]    Special Requests      NONE Performed at [facility], [address] [place], [place] [address]    Gram Stain PENDING    Culture      NO GROWTH < 24 HOURS Performed at [facility], [address] [place], [place] [address]    Report Status PENDING   CBG monitoring, ED     Status: Abnormal   Collection Time: 10/26/20  1:12 PM  Result Value Ref Range   Glucose-Capillary 155 (H) 70 - 99 mg/dL    Comment: Glucose reference range applies only to samples taken after fasting for at least 8 hours.  TSH     Status: None   Collection Time: 10/26/20  3:58 PM  Result Value Ref Range   TSH  1.443 0.350 - 4.500 uIU/mL    Comment: Performed by a 3rd Generation assay with a functional sensitivity of <=0.01 uIU/mL. Performed at [facility], [address] [place], [place] [address]   Lactate dehydrogenase     Status: None   Collection Time: 10/26/20  3:58 PM  Result Value Ref Range   LDH 127 98 - 192 U/L    Comment: Performed at [facility], [address] [place], [place] [address]  Glucose, capillary     Status: Abnormal   Collection Time: 10/26/20  4:05 PM  Result Value Ref Range   Glucose-Capillary 108 (H) 70 - 99 mg/dL    Comment: Glucose reference range applies only to samples taken after fasting for at least 8 hours.  Glucose, capillary     Status: Abnormal   Collection Time: 10/26/20  9:09 PM  Result Value Ref Range   Glucose-Capillary 151 (H) 70 - 99 mg/dL    Comment: Glucose reference range applies only to samples taken after fasting for at least 8 hours.  Basic metabolic panel     Status: Abnormal   Collection Time: 10/27/20  3:39 AM  Result Value Ref Range   Sodium 142 135 - 145 mmol/L   Potassium 3.8 3.5 - 5.1 mmol/L    Comment: DELTA CHECK NOTED   Chloride 108 98 - 111 mmol/L   CO2 28 22 - 32 mmol/L   Glucose, Bld 98 70 - 99 mg/dL    Comment: Glucose reference range applies only to samples taken after fasting for at least 8 hours.   BUN 28 (H) 8 - 23 mg/dL   Creatinine, Ser 5.361.14 (H) 0.44 - 1.00 mg/dL   Calcium 8.2 (L) 8.9 - 10.3 mg/dL   GFR, Estimated 52 (L) >60 mL/min    Comment: (NOTE) Calculated using the CKD-EPI Creatinine Equation (2021)    Anion gap 6 5 - 15    Comment: Performed at [facility], [address] [place], [place] [address]  CBC     Status: Abnormal   Collection Time: 10/27/20  3:39 AM  Result Value Ref Range   WBC 6.4 4.0 - 10.5 K/uL   RBC 4.23 3.87 - 5.11 MIL/uL   Hemoglobin 13.2 12.0 - 15.0 g/dL   HCT 47.442.5 25.936.0 - 56.346.0 %   MCV 100.5 (  H) 80.0 - 100.0 fL   MCH 31.2  26.0 - 34.0 pg   MCHC 31.1 30.0 - 36.0 g/dL   RDW 16.1 (H) 09.6 - 04.5 %   Platelets 143 (L) 150 - 400 K/uL   nRBC 0.0 0.0 - 0.2 %    Comment: Performed at [facility], [address], [place], [place] [address]  Magnesium     Status: None   Collection Time: 10/27/20  3:39 AM  Result Value Ref Range   Magnesium 2.0 1.7 - 2.4 mg/dL    Comment: Performed at [facility], [address], [place], [place] [address]  Glucose, capillary     Status: None   Collection Time: 10/27/20  7:41 AM  Result Value Ref Range   Glucose-Capillary 99 70 - 99 mg/dL    Comment: Glucose reference range applies only to samples taken after fasting for at least 8 hours.    ECG   N/A  Telemetry   Sinus with PAC's and PVC's - Personally Reviewed  Radiology    DG Chest 1 View  Result Date: 10/26/2020 CLINICAL DATA:  Post right sided thoracentesis EXAM: CHEST  1 VIEW COMPARISON:  Earlier film the same day. FINDINGS: No pneumothorax. Decrease in right pleural effusion with small residual. Improved aeration at the right lung base. Persistent diffuse interstitial infiltrates or edema bilaterally, appearing marginally improved. Heart size upper limits normal for technique. Aortic Atherosclerosis (ICD10-170.0). Visualized bones unremarkable. IMPRESSION: 1. No pneumothorax post thoracentesis. Electronically Signed   By: Corlis Leak M.D.   On: 10/26/2020 11:21   DG Chest 2 View  Result Date: 10/26/2020 CLINICAL DATA:  Shortness of breath EXAM: CHEST - 2 VIEW COMPARISON:  03/03/2020 FINDINGS: New right pleural effusion occupying approximately 50% of the right hemithoracic volume. Right basilar atelectasis. No focal consolidation in the left lung. Mild cardiomegaly with calcific aortic atherosclerosis. IMPRESSION: New right pleural effusion occupying approximately 50% of the right hemithoracic volume. Electronically Signed   By: Deatra Robinson M.D.   On: 10/26/2020 03:23    ECHOCARDIOGRAM COMPLETE  Result Date: 10/26/2020    ECHOCARDIOGRAM REPORT   Patient Name:   [name] Date of Exam: 10/26/2020 Medical Rec #:  [id number]          Height:       64.0 in Accession #:    [id number]         Weight:       167.0 lb Date of Birth:  [birth year]           BSA:          1.812 m Patient Age:    70 years           BP:           127/75 mmHg Patient Gender: F                  HR:           82 bpm. Exam Location:  Inpatient Procedure: 2D Echo, Cardiac Doppler and Color Doppler Indications:    CHF  History:        Patient has prior history of Echocardiogram examinations, most                 recent 03/15/2021. CHF, CAD, Signs/Symptoms:Shortness of Breath                 and Pleural effusion, LE edema; Risk Factors:Hypertension,  Diabetes, Dyslipidemia and Current Smoker.  Sonographer:    Lavenia Atlas Referring Phys: 70 DAYNA N DUNN IMPRESSIONS  1. Left ventricular ejection fraction, by estimation, is <20%. The left ventricle has severely decreased function. The left ventricle demonstrates global hypokinesis. Left ventricular diastolic parameters are consistent with Grade II diastolic dysfunction (pseudonormalization). Elevated left ventricular end-diastolic pressure.  2. Right ventricular systolic function is moderately reduced. The right ventricular size is normal. There is mildly elevated pulmonary artery systolic pressure. The estimated right ventricular systolic pressure is 43.0 mmHg.  3. Left atrial size was severely dilated.  4. Right atrial size was severely dilated.  5. The mitral valve is normal in structure. Trivial mitral valve regurgitation. No evidence of mitral stenosis.  6. Tricuspid valve regurgitation is mild to moderate.  7. The aortic valve is tricuspid. Aortic valve regurgitation is trivial. Mild aortic valve sclerosis is present, with no evidence of aortic valve stenosis.  8. The inferior vena cava is normal in size with <50% respiratory  variability, suggesting right atrial pressure of 8 mmHg. FINDINGS  Left Ventricle: Left ventricular ejection fraction, by estimation, is <20%. The left ventricle has severely decreased function. The left ventricle demonstrates global hypokinesis. The left ventricular internal cavity size was normal in size. There is no  left ventricular hypertrophy. Left ventricular diastolic parameters are consistent with Grade II diastolic dysfunction (pseudonormalization). Elevated left ventricular end-diastolic pressure. Right Ventricle: The right ventricular size is normal. No increase in right ventricular wall thickness. Right ventricular systolic function is moderately reduced. There is mildly elevated pulmonary artery systolic pressure. The tricuspid regurgitant velocity is 2.96 m/s, and with an assumed right atrial pressure of 8 mmHg, the estimated right ventricular systolic pressure is 43.0 mmHg. Left Atrium: Left atrial size was severely dilated. Right Atrium: Right atrial size was severely dilated. Pericardium: There is no evidence of pericardial effusion. Mitral Valve: The mitral valve is normal in structure. Trivial mitral valve regurgitation. No evidence of mitral valve stenosis. Tricuspid Valve: The tricuspid valve is normal in structure. Tricuspid valve regurgitation is mild to moderate. No evidence of tricuspid stenosis. Aortic Valve: The aortic valve is tricuspid. Aortic valve regurgitation is trivial. Mild aortic valve sclerosis is present, with no evidence of aortic valve stenosis. Pulmonic Valve: The pulmonic valve was normal in structure. Pulmonic valve regurgitation is trivial. No evidence of pulmonic stenosis. Aorta: The aortic root is normal in size and structure. Venous: The inferior vena cava is normal in size with less than 50% respiratory variability, suggesting right atrial pressure of 8 mmHg. IAS/Shunts: No atrial level shunt detected by color flow Doppler.  LEFT VENTRICLE PLAX 2D LVIDd:         4.60  cm      Diastology LVIDs:         4.40 cm      LV e' medial:    3.00 cm/s LV PW:         0.90 cm      LV E/e' medial:  28.0 LV IVS:        1.00 cm      LV e' lateral:   4.20 cm/s LVOT diam:     2.30 cm      LV E/e' lateral: 20.0 LV SV:         63 LV SV Index:   35 LVOT Area:     4.15 cm  LV Volumes (MOD) LV vol d, MOD A4C: 149.0 ml LV vol s, MOD A4C: 89.0 ml LV SV  MOD A4C:     149.0 ml RIGHT VENTRICLE RV Basal diam:  3.70 cm RV S prime:     7.80 cm/s TAPSE (M-mode): 2.2 cm LEFT ATRIUM             Index       RIGHT ATRIUM           Index LA diam:        5.10 cm 2.81 cm/m  RA Area:     26.90 cm LA Vol (A2C):   89.3 ml 49.28 ml/m RA Volume:   98.80 ml  54.52 ml/m LA Vol (A4C):   87.8 ml 48.45 ml/m LA Biplane Vol: 95.8 ml 52.87 ml/m  AORTIC VALVE LVOT Vmax:   86.20 cm/s LVOT Vmean:  49.100 cm/s LVOT VTI:    0.151 m  AORTA Ao Root diam: 3.20 cm MITRAL VALVE               TRICUSPID VALVE MV Area (PHT): 3.48 cm    TR Peak grad:   35.0 mmHg MV Decel Time: 218 msec    TR Vmax:        296.00 cm/s MV E velocity: 84.00 cm/s MV A velocity: 59.10 cm/s  SHUNTS MV E/A ratio:  1.42        Systemic VTI:  0.15 m                            Systemic Diam: 2.30 cm Armanda Magic MD Electronically signed by Armanda Magic MD Signature Date/Time: 10/26/2020/2:25:43 PM    Final    [place] ASCITES (ABDOMEN LIMITED)  Result Date: 10/26/2020 CLINICAL DATA:  69 year old female with history of volume overload. Evaluate for ascites. EXAM: LIMITED ABDOMEN ULTRASOUND FOR ASCITES TECHNIQUE: Limited ultrasound survey for ascites was performed in all four abdominal quadrants. COMPARISON:  None. FINDINGS: Small volume of ascites noted. IMPRESSION: 1. Study is positive for small volume of ascites. Electronically Signed   By: Trudie Reed M.D.   On: 10/26/2020 12:06   US THORACENTESIS ASP PLEURAL SPACE W/IMG GUIDE  Result Date: 10/26/2020 INDICATION: Shortness of breath. Large right pleural effusion. Request diagnostic and therapeutic  thoracentesis. EXAM: ULTRASOUND GUIDED RIGHT THORACENTESIS MEDICATIONS: 1% plain lidocaine, 5 mL COMPLICATIONS: None immediate.  No pneumothorax on follow-up chest radiograph. PROCEDURE: An ultrasound guided thoracentesis was thoroughly discussed with the patient and questions answered. The benefits, risks, alternatives and complications were also discussed. The patient understands and wishes to proceed with the procedure. Written consent was obtained. Ultrasound was performed to localize and mark an adequate pocket of fluid in the right chest. The area was then prepped and draped in the normal sterile fashion. 1% Lidocaine was used for local anesthesia. Under ultrasound guidance a 6 Fr Safe-T-Centesis catheter was introduced. Thoracentesis was performed. The catheter was removed and a dressing applied. FINDINGS: A total of approximately 1.9 L of clear yellow fluid was removed. Samples were sent to the laboratory as requested by the clinical team. IMPRESSION: Successful ultrasound guided right thoracentesis yielding 1.9 L of pleural fluid. Read by: Brayton El PA-C Electronically Signed   By: Corlis Leak M.D.   On: 10/26/2020 11:32    Cardiac Studies   See echo result  Assessment   Active Problems:   Pleural effusion   Shortness of breath   Acute on chronic combined systolic and diastolic CHF (congestive heart failure) (HCC)   Pressure injury of skin   Pulmonary hypertension, unspecified (HCC)  Plan   Much improved today after almost 2L thoracentesis and 1.6L urine output - creatinine improved. Plan for RHC today at [facility] with Dr. Gala Romney - may need advanced HF consultation and management  appreciated based on findings. Could remain at [facility] after cath for management if advanced therapies are indicated.   The procedure with Risks/Benefits/Alternatives and Indications was reviewed with the patient Florabel Faulks. Deosha Roan.  All questions were answered.    Risks / Complications include, but not  limited to: Death, MI, CVA/TIA, VF/VT (with defibrillation), Bradycardia (need for temporary pacer placement), contrast induced nephropathy, bleeding / bruising / hematoma / pseudoaneurysm, vascular or coronary injury (with possible emergent CT or Vascular Surgery), adverse medication reactions, infection.    The patient voiced understanding of these risks and agrees to proceed.   I have signed the consent form and placed it on the chart for patient signature.    Time Spent Directly with Patient:  I have spent a total of 25 minutes with the patient reviewing hospital notes, telemetry, EKGs, labs and examining the patient as well as establishing an assessment and plan that was discussed personally with the patient.  > 50% of time was spent in direct patient care.  Length of Stay:  LOS: 1 day   Chrystie Nose, MD, [facility], FACP  [place]  [facility]  Medical Director of the Advanced Lipid Disorders &  Cardiovascular Risk Reduction Clinic Diplomate of the American Board of Clinical Lipidology Attending Cardiologist  Direct Dial: [phone]  Fax: [phone]  Website:  [email or web address] Nicely Corah Willeford 10/27/2020, 10:09 AM

## 2020-10-27 NOTE — Plan of Care (Signed)
  Problem: Education: Goal: Knowledge of General Education information will improve Description: Including pain rating scale, medication(s)/side effects and non-pharmacologic comfort measures Outcome: Progressing   Problem: Health Behavior/Discharge Planning: Goal: Ability to manage health-related needs will improve Outcome: Progressing   Problem: Clinical Measurements: Goal: Ability to maintain clinical measurements within normal limits will improve Outcome: Progressing Goal: Will remain free from infection Outcome: Progressing Goal: Diagnostic test results will improve Outcome: Progressing Goal: Respiratory complications will improve Outcome: Progressing Goal: Cardiovascular complication will be avoided Outcome: Progressing   Problem: Nutrition: Goal: Adequate nutrition will be maintained Outcome: Progressing   Problem: Coping: Goal: Level of anxiety will decrease Outcome: Progressing   Problem: Elimination: Goal: Will not experience complications related to bowel motility Outcome: Progressing Goal: Will not experience complications related to urinary retention Outcome: Progressing   Problem: Pain Managment: Goal: General experience of comfort will improve Outcome: Progressing   Problem: Safety: Goal: Ability to remain free from injury will improve Outcome: Progressing   Problem: Skin Integrity: Goal: Risk for impaired skin integrity will decrease Outcome: Progressing   Problem: Education: Goal: Ability to demonstrate management of disease process will improve Outcome: Progressing Goal: Ability to verbalize understanding of medication therapies will improve Outcome: Progressing Goal: Individualized Educational Video(s) Outcome: Progressing   Problem: Activity: Goal: Capacity to carry out activities will improve Outcome: Progressing   Problem: Cardiac: Goal: Ability to achieve and maintain adequate cardiopulmonary perfusion will improve Outcome:  Progressing   

## 2020-10-28 ENCOUNTER — Encounter (HOSPITAL_COMMUNITY): Payer: Self-pay | Admitting: Family Medicine

## 2020-10-28 LAB — CBC
HCT: 43.6 % (ref 36.0–46.0)
Hemoglobin: 13.5 g/dL (ref 12.0–15.0)
MCH: 30.4 pg (ref 26.0–34.0)
MCHC: 31 g/dL (ref 30.0–36.0)
MCV: 98.2 fL (ref 80.0–100.0)
Platelets: 157 10*3/uL (ref 150–400)
RBC: 4.44 MIL/uL (ref 3.87–5.11)
RDW: 15.9 % — ABNORMAL HIGH (ref 11.5–15.5)
WBC: 5.6 10*3/uL (ref 4.0–10.5)
nRBC: 0 % (ref 0.0–0.2)

## 2020-10-28 LAB — BASIC METABOLIC PANEL
Anion gap: 8 (ref 5–15)
BUN: 27 mg/dL — ABNORMAL HIGH (ref 8–23)
CO2: 32 mmol/L (ref 22–32)
Calcium: 8.5 mg/dL — ABNORMAL LOW (ref 8.9–10.3)
Chloride: 103 mmol/L (ref 98–111)
Creatinine, Ser: 1.11 mg/dL — ABNORMAL HIGH (ref 0.44–1.00)
GFR, Estimated: 53 mL/min — ABNORMAL LOW (ref >60)
Glucose, Bld: 104 mg/dL — ABNORMAL HIGH (ref 70–99)
Potassium: 3.6 mmol/L (ref 3.5–5.1)
Sodium: 143 mmol/L (ref 135–145)

## 2020-10-28 LAB — GLUCOSE, CAPILLARY
Glucose-Capillary: 106 mg/dL — ABNORMAL HIGH (ref 70–99)
Glucose-Capillary: 119 mg/dL — ABNORMAL HIGH (ref 70–99)
Glucose-Capillary: 101 mg/dL — ABNORMAL HIGH (ref 70–99)
Glucose-Capillary: 118 mg/dL — ABNORMAL HIGH (ref 70–99)

## 2020-10-28 LAB — PH, BODY FLUID: pH, Body Fluid: 7.4

## 2020-10-28 LAB — MAGNESIUM: Magnesium: 2 mg/dL (ref 1.7–2.4)

## 2020-10-28 MED ORDER — TORSEMIDE 20 MG PO TABS
60.0000 mg | ORAL_TABLET | Freq: Every day | ORAL | Status: DC
  Administered 2020-10-29: 60 mg via ORAL
  Filled 2020-10-28: qty 3

## 2020-10-28 MED ORDER — METOPROLOL TARTRATE 25 MG PO TABS
12.5000 mg | ORAL_TABLET | Freq: Two times a day (BID) | ORAL | Status: DC
  Administered 2020-10-28 – 2020-10-29 (×2): 12.5 mg via ORAL
  Filled 2020-10-28 (×2): qty 1

## 2020-10-28 MED ORDER — FUROSEMIDE 40 MG PO TABS: 80.0000 mg | ORAL_TABLET | Freq: Every day | ORAL | Status: DC

## 2020-10-28 MED ORDER — EMPAGLIFLOZIN 10 MG PO TABS
10.0000 mg | ORAL_TABLET | Freq: Every day | ORAL | Status: DC
  Administered 2020-10-28 – 2020-10-29 (×2): 10 mg via ORAL
  Filled 2020-10-28 (×3): qty 1

## 2020-10-28 NOTE — Progress Notes (Signed)
Pt had 6 beat run of VTACH. Pt is asymptomatic. MD notified. No new orders. Will continue to monitor for changes.

## 2020-10-28 NOTE — Care Management Important Message (Signed)
Important Message  Patient Details IM Letter placed in Patient's room. Name: SHATASHA LAMBING MRN: [id number] Date of Birth: [birth year]   Medicare Important Message Given:  Yes     Caren Macadam 10/28/2020, 2:34 PM

## 2020-10-28 NOTE — Progress Notes (Signed)
[name]  DXA:[id number] DOB: [birth year] DOA: 10/25/2020 PCP: [name], MD    Brief Narrative:  70yo with a history of chronic non-ischemic systolic and diastolic CHF (EF 20-94%/BSJGG 3 DD), DM2, DVT, depression, and HTN who presented to the ED with shortness of breath of 2 nights duration.  Significant Events:  7/12 admit via [facility] ED 7/12 right thoracentesis producing 1.9 L 7/13 R heart cath at [facility]   Consultants:  Cardiology / Heart Failure Team  Code Status: FULL CODE  Antimicrobials:  None  DVT prophylaxis: SCDs  Subjective: Continues to feel little better every day.  No chest pain nausea vomiting abdominal pain.  Shortness of breath with exertion improving.  Assessment & Plan:  Acute nonischemic idiopathic combined systolic and diastolic CHF exacerbation -volume overload Continue diuresis -monitor renal function and blood pressure tolerance - R heart cath noted only modestly increased biventricular filling pressures and mild mixed Pulm HTN w/ "normal" CO - net negative ~2.6 L thus far  [employer]   10/25/20 2318 10/27/20 0500 10/28/20 0500  Weight: 75.8 kg 71.3 kg 69.5 kg     Right pleural effusion Status postthoracentesis 7/12 with 1.9 L drained - significant symptomatic improvement - c/w transudate and felt to be due to CHF   Acute kidney injury Renal function improving with diuresis -monitor trend  Recent Labs  Lab 10/26/20 0335 10/27/20 0339 10/28/20 0322  CREATININE 1.25* 1.14* 1.11*    Hypokalemia Due to diuresis -continue to supplement and follow trend - Mg normal   Left leg wound Monitor in serial fashion -no concerning findings on exam presently  DM2 CBG well controlled at present  HLD   Family Communication:  Status is: Inpatient  Remains inpatient appropriate because:Inpatient level of care appropriate due to severity of illness  Dispo: The patient is from: Home              Anticipated d/c is to: Home               Patient currently is not medically stable to d/c.   Difficult to place patient No   Objective: Blood pressure 113/72, pulse 78, temperature 98.1 F (36.7 C), temperature source Oral, resp. rate 20, height 5\' 4"  (1.626 m), weight 69.5 kg, SpO2 92 %.  Intake/Output Summary (Last 24 hours) at 10/28/2020 1002 Last data filed at 10/28/2020 0900 Gross per 24 hour  Intake 440 ml  Output 900 ml  Net -460 ml    Filed Weights   10/25/20 2318 10/27/20 0500 10/28/20 0500  Weight: 75.8 kg 71.3 kg 69.5 kg    Examination: General: No acute respiratory distress -some pursed lip breathing continues Lungs: Diffuse fine crackles w-no wheeze Cardiovascular: RRR without murmur Abdomen: Soft, bowel sounds positive, some edema of the abdominal wall, no mass Extremities: 2+ pitting edema bilateral lower extremities without significant change  CBC: Recent Labs  Lab 10/26/20 0335 10/27/20 0339 10/27/20 1156 10/27/20 1200 10/28/20 0322  WBC 7.0 6.4  --   --  5.6  NEUTROABS 5.4  --   --   --   --   HGB 15.0 13.2 13.9  15.3* 11.9* 13.5  HCT 47.7* 42.5 41.0  45.0 35.0* 43.6  MCV 99.6 100.5*  --   --  98.2  PLT 185 143*  --   --  157    Basic Metabolic Panel: Recent Labs  Lab 10/26/20 0335 10/27/20 0339 10/27/20 1156 10/27/20 1200 10/28/20 0322  NA 143 142 148*  145 153* 143  K 5.5* 3.8 3.1*  3.6 2.5* 3.6  CL 108 108  --   --  103  CO2 26 28  --   --  32  GLUCOSE 140* 98  --   --  104*  BUN 28* 28*  --   --  27*  CREATININE 1.25* 1.14*  --   --  1.11*  CALCIUM 8.6* 8.2*  --   --  8.5*  MG  --  2.0  --   --  2.0    GFR: Estimated Creatinine Clearance: 45.1 mL/min (A) (by C-G formula based on SCr of 1.11 mg/dL (H)).  Liver Function Tests: Recent Labs  Lab 10/26/20 0335  AST 37  ALT 17  ALKPHOS 181*  BILITOT 1.8*  PROT 6.1*  ALBUMIN 3.4*     Coagulation Profile: Recent Labs  Lab 10/26/20 0335  INR 1.2      HbA1C: Hgb A1c MFr Bld  Date/Time Value  Ref Range Status  10/26/2020 08:46 AM 6.1 (H) 4.8 - 5.6 % Final    Comment:    (NOTE) Pre diabetes:          5.7%-6.4%  Diabetes:              >6.4%  Glycemic control for   <7.0% adults with diabetes     CBG: Recent Labs  Lab 10/27/20 1235 10/27/20 1350 10/27/20 1629 10/27/20 2141 10/28/20 0749  GLUCAP 106* 170* 149* 118* 106*     Recent Results (from the past 240 hour(s))  Resp Panel by RT-PCR (Flu A&B, Covid) Nasopharyngeal Swab     Status: None   Collection Time: 10/26/20  3:35 AM   Specimen: Nasopharyngeal Swab; Nasopharyngeal(NP) swabs in vial transport medium  Result Value Ref Range Status   SARS Coronavirus 2 by RT PCR NEGATIVE NEGATIVE Final    Comment: (NOTE) SARS-CoV-2 target nucleic acids are NOT DETECTED.  The SARS-CoV-2 RNA is generally detectable in upper respiratory specimens during the acute phase of infection. The lowest concentration of SARS-CoV-2 viral copies this assay can detect is 138 copies/mL. A negative result does not preclude SARS-Cov-2 infection and should not be used as the sole basis for treatment or other patient management decisions. A negative result may occur with  improper specimen collection/handling, submission of specimen other than nasopharyngeal swab, presence of viral mutation(s) within the areas targeted by this assay, and inadequate number of viral copies(<138 copies/mL). A negative result must be combined with clinical observations, patient history, and epidemiological information. The expected result is Negative.  Fact Sheet for Patients:  BloggerCourse.com  Fact Sheet for Healthcare Providers:  SeriousBroker.it  This test is no t yet approved or cleared by the [place] FDA and  has been authorized for detection and/or diagnosis of SARS-CoV-2 by FDA under an Emergency Use Authorization (EUA). This EUA will remain  in effect (meaning this test can be used) for the  duration of the COVID-19 declaration under Section 564(b)(1) of the Act, 21 U.S.C.section 360bbb-3(b)(1), unless the authorization is terminated  or revoked sooner.       Influenza A by PCR NEGATIVE NEGATIVE Final   Influenza B by PCR NEGATIVE NEGATIVE Final    Comment: (NOTE) The Xpert Xpress SARS-CoV-2/FLU/RSV plus assay is intended as an aid in the diagnosis of influenza from Nasopharyngeal swab specimens and should not be used as a sole basis for treatment. Nasal washings and aspirates are unacceptable for Xpert Xpress SARS-CoV-2/FLU/RSV testing.  Fact Sheet for Patients:  BloggerCourse.com  Fact Sheet for Healthcare Providers: SeriousBroker.it  This test is not yet approved or cleared by the [place] FDA and has been authorized for detection and/or diagnosis of SARS-CoV-2 by FDA under an Emergency Use Authorization (EUA). This EUA will remain in effect (meaning this test can be used) for the duration of the COVID-19 declaration under Section 564(b)(1) of the Act, 21 U.S.C. section 360bbb-3(b)(1), unless the authorization is terminated or revoked.  Performed at [facility], [address], [place], [place] [address]   Body fluid culture w Gram Stain     Status: None (Preliminary result)   Collection Time: 10/26/20 10:45 AM   Specimen: PATH Cytology Pleural fluid  Result Value Ref Range Status   Specimen Description   Final    PLEURAL Performed at [facility], [address] [place], [place] [address]    Special Requests   Final    NONE Performed at [facility], [address], [place], [place] [address]    Gram Stain   Final    RARE WBC PRESENT, PREDOMINANTLY MONONUCLEAR NO ORGANISMS SEEN    Culture   Final    NO GROWTH 2 DAYS Performed at [facility], [address], [place], [place] [address]    Report Status PENDING  Incomplete       Scheduled Meds:  aspirin EC  81 mg Oral QPM   enoxaparin (LOVENOX) injection  40 mg Subcutaneous Q24H   furosemide  60 mg Intravenous BID   insulin aspart  0-5 Units Subcutaneous QHS   insulin aspart  0-9 Units Subcutaneous TID WC   multivitamin  1 tablet Oral Daily   sacubitril-valsartan  1 tablet Oral BID   sodium chloride flush  3 mL Intravenous Q12H   sodium chloride flush  3 mL Intravenous Q12H   spironolactone  25 mg Oral Daily   Continuous Infusions:  sodium chloride       LOS: 2 days   Lonia Blood, MD [facility] Office  [phone] Pager - Text Page per Loretha Stapler  If 7PM-7AM, please contact night-coverage per Amion 10/28/2020, 10:02 AM

## 2020-10-28 NOTE — Progress Notes (Signed)
Pt had 11 beat runs of V tach. Pt is asymptomatic. Provider is notified. No new orders. Monitoring closely.

## 2020-10-28 NOTE — Progress Notes (Addendum)
Progress Note  Patient Name: SANYAH MOLNAR Date of Encounter: 10/28/2020  [facility] Cardiologist: Garwin Brothers, MD   Subjective   No chest pain and able to sleep at 30 degrees last pm, abd feels much better.   Inpatient Medications    Scheduled Meds:  aspirin EC  81 mg Oral QPM   enoxaparin (LOVENOX) injection  40 mg Subcutaneous Q24H   furosemide  60 mg Intravenous BID   insulin aspart  0-5 Units Subcutaneous QHS   insulin aspart  0-9 Units Subcutaneous TID WC   multivitamin  1 tablet Oral Daily   sacubitril-valsartan  1 tablet Oral BID   sodium chloride flush  3 mL Intravenous Q12H   sodium chloride flush  3 mL Intravenous Q12H   spironolactone  25 mg Oral Daily   Continuous Infusions:  sodium chloride     PRN Meds: sodium chloride, acetaminophen, ondansetron (ZOFRAN) IV, polyvinyl alcohol, sodium chloride flush   Vital Signs    Vitals:   10/27/20 2143 10/28/20 0500 10/28/20 0534 10/28/20 0543  BP: 119/61  113/72   Pulse: 75  80 78  Resp: (!) 21  (!) 21 20  Temp: 98.8 F (37.1 C)  98.1 F (36.7 C)   TempSrc: Oral  Oral   SpO2: 91%  (!) 83% 92%  Weight:  69.5 kg    Height:        Intake/Output Summary (Last 24 hours) at 10/28/2020 0906 Last data filed at 10/27/2020 1650 Gross per 24 hour  Intake 200 ml  Output 900 ml  Net -700 ml   Last 3 Weights 10/28/2020 10/27/2020 10/25/2020  Weight (lbs) 153 lb 3.5 oz 157 lb 3 oz 167 lb  Weight (kg) 69.5 kg 71.3 kg 75.751 kg      Telemetry    SR with PACs and PVCs one episode of SVT for 11 sec last pm.  - Personally Reviewed  ECG    No new - Personally Reviewed  Physical Exam   GEN: No acute distress.   Neck: + JVD Cardiac: RRR, no murmurs, rubs, or gallops.  Respiratory: Clear to diminished to auscultation bilaterally. GI: Soft, nontender, non-distended  MS: 2+ lower ext edema to hips but above knee mostly posterior now.; No deformity. Neuro:  Nonfocal  Psych: Normal affect   Labs     High Sensitivity Troponin:   Recent Labs  Lab 10/26/20 0335 10/26/20 0521  TROPONINIHS 90* 92*      Chemistry Recent Labs  Lab 10/26/20 0335 10/27/20 0339 10/27/20 1156 10/27/20 1200 10/28/20 0322  NA 143 142 148*  145 153* 143  K 5.5* 3.8 3.1*  3.6 2.5* 3.6  CL 108 108  --   --  103  CO2 26 28  --   --  32  GLUCOSE 140* 98  --   --  104*  BUN 28* 28*  --   --  27*  CREATININE 1.25* 1.14*  --   --  1.11*  CALCIUM 8.6* 8.2*  --   --  8.5*  PROT 6.1*  --   --   --   --   ALBUMIN 3.4*  --   --   --   --   AST 37  --   --   --   --   ALT 17  --   --   --   --   ALKPHOS 181*  --   --   --   --  BILITOT 1.8*  --   --   --   --   GFRNONAA 46* 52*  --   --  53*  ANIONGAP 9 6  --   --  8     Hematology Recent Labs  Lab 10/26/20 0335 10/27/20 0339 10/27/20 1156 10/27/20 1200 10/28/20 0322  WBC 7.0 6.4  --   --  5.6  RBC 4.79 4.23  --   --  4.44  HGB 15.0 13.2 13.9  15.3* 11.9* 13.5  HCT 47.7* 42.5 41.0  45.0 35.0* 43.6  MCV 99.6 100.5*  --   --  98.2  MCH 31.3 31.2  --   --  30.4  MCHC 31.4 31.1  --   --  31.0  RDW 16.2* 16.3*  --   --  15.9*  PLT 185 143*  --   --  157    BNP Recent Labs  Lab 10/26/20 0335  BNP >4,500.0*     DDimer No results for input(s): DDIMER in the last 168 hours.   Radiology    DG Chest 1 View  Result Date: 10/26/2020 CLINICAL DATA:  Post right sided thoracentesis EXAM: CHEST  1 VIEW COMPARISON:  Earlier film the same day. FINDINGS: No pneumothorax. Decrease in right pleural effusion with small residual. Improved aeration at the right lung base. Persistent diffuse interstitial infiltrates or edema bilaterally, appearing marginally improved. Heart size upper limits normal for technique. Aortic Atherosclerosis (ICD10-170.0). Visualized bones unremarkable. IMPRESSION: 1. No pneumothorax post thoracentesis. Electronically Signed   By: Corlis Leak M.D.   On: 10/26/2020 11:21   CARDIAC CATHETERIZATION  Result Date:  10/27/2020 Findings: RA = 11 RV = 56/12 PA = 50/21 (34) PCW = 20 Fick cardiac output/index = 4.8/2.7 PVR = 2.9 WU Ao sat = 99% PA sat = 71%, 72% SVC sat = 68% PaPi = 2.6 Assessment: 1. Mildly elevated biventricular filling pressures 2. Mild mixed pulmonary HTN 3. Normal cardiac output Plan/Discussion: Continue medical therapy. Arvilla Meres, MD 12:17 PM   ECHOCARDIOGRAM COMPLETE  Result Date: 10/26/2020    ECHOCARDIOGRAM REPORT   Patient Name:   HAYLEEN CLINKSCALES Date of Exam: 10/26/2020 Medical Rec #:  [id number]          Height:       64.0 in Accession #:    [id number]         Weight:       167.0 lb Date of Birth:  November 15, 1950           BSA:          1.812 m Patient Age:    70 years           BP:           127/75 mmHg Patient Gender: F                  HR:           82 bpm. Exam Location:  Inpatient Procedure: 2D Echo, Cardiac Doppler and Color Doppler Indications:    CHF  History:        Patient has prior history of Echocardiogram examinations, most                 recent 03/15/2021. CHF, CAD, Signs/Symptoms:Shortness of Breath                 and Pleural effusion, LE edema; Risk Factors:Hypertension,  Diabetes, Dyslipidemia and Current Smoker.  Sonographer:    Lavenia Atlas Referring Phys: [id number] DAYNA N DUNN IMPRESSIONS  1. Left ventricular ejection fraction, by estimation, is <20%. The left ventricle has severely decreased function. The left ventricle demonstrates global hypokinesis. Left ventricular diastolic parameters are consistent with Grade II diastolic dysfunction (pseudonormalization). Elevated left ventricular end-diastolic pressure.  2. Right ventricular systolic function is moderately reduced. The right ventricular size is normal. There is mildly elevated pulmonary artery systolic pressure. The estimated right ventricular systolic pressure is 43.0 mmHg.  3. Left atrial size was severely dilated.  4. Right atrial size was severely dilated.  5. The mitral valve is normal in  structure. Trivial mitral valve regurgitation. No evidence of mitral stenosis.  6. Tricuspid valve regurgitation is mild to moderate.  7. The aortic valve is tricuspid. Aortic valve regurgitation is trivial. Mild aortic valve sclerosis is present, with no evidence of aortic valve stenosis.  8. The inferior vena cava is normal in size with <50% respiratory variability, suggesting right atrial pressure of 8 mmHg. FINDINGS  Left Ventricle: Left ventricular ejection fraction, by estimation, is <20%. The left ventricle has severely decreased function. The left ventricle demonstrates global hypokinesis. The left ventricular internal cavity size was normal in size. There is no  left ventricular hypertrophy. Left ventricular diastolic parameters are consistent with Grade II diastolic dysfunction (pseudonormalization). Elevated left ventricular end-diastolic pressure. Right Ventricle: The right ventricular size is normal. No increase in right ventricular wall thickness. Right ventricular systolic function is moderately reduced. There is mildly elevated pulmonary artery systolic pressure. The tricuspid regurgitant velocity is 2.96 m/s, and with an assumed right atrial pressure of 8 mmHg, the estimated right ventricular systolic pressure is 43.0 mmHg. Left Atrium: Left atrial size was severely dilated. Right Atrium: Right atrial size was severely dilated. Pericardium: There is no evidence of pericardial effusion. Mitral Valve: The mitral valve is normal in structure. Trivial mitral valve regurgitation. No evidence of mitral valve stenosis. Tricuspid Valve: The tricuspid valve is normal in structure. Tricuspid valve regurgitation is mild to moderate. No evidence of tricuspid stenosis. Aortic Valve: The aortic valve is tricuspid. Aortic valve regurgitation is trivial. Mild aortic valve sclerosis is present, with no evidence of aortic valve stenosis. Pulmonic Valve: The pulmonic valve was normal in structure. Pulmonic valve  regurgitation is trivial. No evidence of pulmonic stenosis. Aorta: The aortic root is normal in size and structure. Venous: The inferior vena cava is normal in size with less than 50% respiratory variability, suggesting right atrial pressure of 8 mmHg. IAS/Shunts: No atrial level shunt detected by color flow Doppler.  LEFT VENTRICLE PLAX 2D LVIDd:         4.60 cm      Diastology LVIDs:         4.40 cm      LV e' medial:    3.00 cm/s LV PW:         0.90 cm      LV E/e' medial:  28.0 LV IVS:        1.00 cm      LV e' lateral:   4.20 cm/s LVOT diam:     2.30 cm      LV E/e' lateral: 20.0 LV SV:         63 LV SV Index:   35 LVOT Area:     4.15 cm  LV Volumes (MOD) LV vol d, MOD A4C: 149.0 ml LV vol s, MOD A4C: 89.0 ml LV SV  MOD A4C:     149.0 ml RIGHT VENTRICLE RV Basal diam:  3.70 cm RV S prime:     7.80 cm/s TAPSE (M-mode): 2.2 cm LEFT ATRIUM             Index       RIGHT ATRIUM           Index LA diam:        5.10 cm 2.81 cm/m  RA Area:     26.90 cm LA Vol (A2C):   89.3 ml 49.28 ml/m RA Volume:   98.80 ml  54.52 ml/m LA Vol (A4C):   87.8 ml 48.45 ml/m LA Biplane Vol: 95.8 ml 52.87 ml/m  AORTIC VALVE LVOT Vmax:   86.20 cm/s LVOT Vmean:  49.100 cm/s LVOT VTI:    0.151 m  AORTA Ao Root diam: 3.20 cm MITRAL VALVE               TRICUSPID VALVE MV Area (PHT): 3.48 cm    TR Peak grad:   35.0 mmHg MV Decel Time: 218 msec    TR Vmax:        296.00 cm/s MV E velocity: 84.00 cm/s MV A velocity: 59.10 cm/s  SHUNTS MV E/A ratio:  1.42        Systemic VTI:  0.15 m                            Systemic Diam: 2.30 cm Armanda Magic MD Electronically signed by Armanda Magic MD Signature Date/Time: 10/26/2020/2:25:43 PM    Final    [place] ASCITES (ABDOMEN LIMITED)  Result Date: 10/26/2020 CLINICAL DATA:  70 year old female with history of volume overload. Evaluate for ascites. EXAM: LIMITED ABDOMEN ULTRASOUND FOR ASCITES TECHNIQUE: Limited ultrasound survey for ascites was performed in all four abdominal quadrants. COMPARISON:   None. FINDINGS: Small volume of ascites noted. IMPRESSION: 1. Study is positive for small volume of ascites. Electronically Signed   By: Trudie Reed M.D.   On: 10/26/2020 12:06   US THORACENTESIS ASP PLEURAL SPACE W/IMG GUIDE  Result Date: 10/26/2020 INDICATION: Shortness of breath. Large right pleural effusion. Request diagnostic and therapeutic thoracentesis. EXAM: ULTRASOUND GUIDED RIGHT THORACENTESIS MEDICATIONS: 1% plain lidocaine, 5 mL COMPLICATIONS: None immediate.  No pneumothorax on follow-up chest radiograph. PROCEDURE: An ultrasound guided thoracentesis was thoroughly discussed with the patient and questions answered. The benefits, risks, alternatives and complications were also discussed. The patient understands and wishes to proceed with the procedure. Written consent was obtained. Ultrasound was performed to localize and mark an adequate pocket of fluid in the right chest. The area was then prepped and draped in the normal sterile fashion. 1% Lidocaine was used for local anesthesia. Under ultrasound guidance a 6 Fr Safe-T-Centesis catheter was introduced. Thoracentesis was performed. The catheter was removed and a dressing applied. FINDINGS: A total of approximately 1.9 L of clear yellow fluid was removed. Samples were sent to the laboratory as requested by the clinical team. IMPRESSION: Successful ultrasound guided right thoracentesis yielding 1.9 L of pleural fluid. Read by: Brayton El PA-C Electronically Signed   By: Corlis Leak M.D.   On: 10/26/2020 11:32    Cardiac Studies   RHC 10/27/20 Findings:   RA = 11 RV = 56/12 PA = 50/21 (34) PCW = 20 Fick cardiac output/index = 4.8/2.7 PVR = 2.9 WU Ao sat = 99% PA sat = 71%, 72% SVC sat = 68% PaPi = 2.6  Assessment: 1. Mildly elevated biventricular filling pressures 2. Mild mixed pulmonary HTN 3. Normal cardiac output   Plan/Discussion:   Continue medical therapy.   Arvilla Meres, MD  12:17 PM  Echo  10/26/20 IMPRESSIONS     1. Left ventricular ejection fraction, by estimation, is <20%. The left  ventricle has severely decreased function. The left ventricle demonstrates  global hypokinesis. Left ventricular diastolic parameters are consistent  with Grade II diastolic  dysfunction (pseudonormalization). Elevated left ventricular end-diastolic  pressure.   2. Right ventricular systolic function is moderately reduced. The right  ventricular size is normal. There is mildly elevated pulmonary artery  systolic pressure. The estimated right ventricular systolic pressure is  43.0 mmHg.   3. Left atrial size was severely dilated.   4. Right atrial size was severely dilated.   5. The mitral valve is normal in structure. Trivial mitral valve  regurgitation. No evidence of mitral stenosis.   6. Tricuspid valve regurgitation is mild to moderate.   7. The aortic valve is tricuspid. Aortic valve regurgitation is trivial.  Mild aortic valve sclerosis is present, with no evidence of aortic valve  stenosis.   8. The inferior vena cava is normal in size with <50% respiratory  variability, suggesting right atrial pressure of 8 mmHg.   FINDINGS   Left Ventricle: Left ventricular ejection fraction, by estimation, is  <20%. The left ventricle has severely decreased function. The left  ventricle demonstrates global hypokinesis. The left ventricular internal  cavity size was normal in size. There is no   left ventricular hypertrophy. Left ventricular diastolic parameters are  consistent with Grade II diastolic dysfunction (pseudonormalization).  Elevated left ventricular end-diastolic pressure.   Right Ventricle: The right ventricular size is normal. No increase in  right ventricular wall thickness. Right ventricular systolic function is  moderately reduced. There is mildly elevated pulmonary artery systolic  pressure. The tricuspid regurgitant  velocity is 2.96 m/s, and with an assumed right atrial  pressure of 8 mmHg,  the estimated right ventricular systolic pressure is 43.0 mmHg.   Left Atrium: Left atrial size was severely dilated.   Right Atrium: Right atrial size was severely dilated.   Pericardium: There is no evidence of pericardial effusion.   Mitral Valve: The mitral valve is normal in structure. Trivial mitral  valve regurgitation. No evidence of mitral valve stenosis.   Tricuspid Valve: The tricuspid valve is normal in structure. Tricuspid  valve regurgitation is mild to moderate. No evidence of tricuspid  stenosis.   Aortic Valve: The aortic valve is tricuspid. Aortic valve regurgitation is  trivial. Mild aortic valve sclerosis is present, with no evidence of  aortic valve stenosis.   Pulmonic Valve: The pulmonic valve was normal in structure. Pulmonic valve  regurgitation is trivial. No evidence of pulmonic stenosis.   Aorta: The aortic root is normal in size and structure.   Venous: The inferior vena cava is normal in size with less than 50%  respiratory variability, suggesting right atrial pressure of 8 mmHg.   IAS/Shunts: No atrial level shunt detected by color flow Doppler.       Patient Profile     70 y.o. 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   Assessment & Plan  CHF acute on chronic combined systolic and diastolic CHF had RHC yesterday  -pressures improving -neg [id number] since admit and wt down from 75.8 Kg to 69.5 Kg. -Cr 1.1; K+ 3.6 Mg+ 2.0  lasix 60 mg BID IV  Pl effusion with throacentesis with almost 2 L removed    Pulmonary HTN -medical therapy.   HTN stable on Entresto   DM-2 per IM     For questions or updates, please contact [facility] Please consult [email or web address] for contact info under         Signed, Nada BoozerLaura Bert Givans, NP  10/28/2020, 9:06 AM

## 2020-10-28 NOTE — Plan of Care (Signed)

## 2020-10-28 NOTE — Progress Notes (Signed)
Pt had 11 beat runs of V tach. Writer was with Pt, asymptomatic. Provider is notified via secure chat. Monitoring closely.

## 2020-10-29 LAB — BASIC METABOLIC PANEL
Anion gap: 9 (ref 5–15)
BUN: 31 mg/dL — ABNORMAL HIGH (ref 8–23)
CO2: 33 mmol/L — ABNORMAL HIGH (ref 22–32)
Calcium: 8.4 mg/dL — ABNORMAL LOW (ref 8.9–10.3)
Chloride: 99 mmol/L (ref 98–111)
Creatinine, Ser: 1.1 mg/dL — ABNORMAL HIGH (ref 0.44–1.00)
GFR, Estimated: 54 mL/min — ABNORMAL LOW (ref >60)
Glucose, Bld: 102 mg/dL — ABNORMAL HIGH (ref 70–99)
Potassium: 3.5 mmol/L (ref 3.5–5.1)
Sodium: 141 mmol/L (ref 135–145)

## 2020-10-29 LAB — BODY FLUID CULTURE W GRAM STAIN: Culture: NO GROWTH

## 2020-10-29 LAB — GLUCOSE, CAPILLARY
Glucose-Capillary: 106 mg/dL — ABNORMAL HIGH (ref 70–99)
Glucose-Capillary: 131 mg/dL — ABNORMAL HIGH (ref 70–99)

## 2020-10-29 LAB — MAGNESIUM: Magnesium: 2.2 mg/dL (ref 1.7–2.4)

## 2020-10-29 MED ORDER — EYLEA 2 MG/0.05ML IZ SOLN: 2.0000 mg | INTRAVITREAL | Status: DC

## 2020-10-29 MED ORDER — POTASSIUM CHLORIDE CRYS ER 20 MEQ PO TBCR: 20.0000 meq | EXTENDED_RELEASE_TABLET | Freq: Every day | ORAL | Status: DC

## 2020-10-29 MED ORDER — METOPROLOL SUCCINATE ER 25 MG PO TB24: 25.0000 mg | ORAL_TABLET | Freq: Every day | ORAL | 1 refills | Status: DC

## 2020-10-29 MED ORDER — POTASSIUM CHLORIDE CRYS ER 20 MEQ PO TBCR: 20.0000 meq | EXTENDED_RELEASE_TABLET | Freq: Every day | ORAL | 1 refills | Status: DC

## 2020-10-29 MED ORDER — TORSEMIDE 60 MG PO TABS: 60.0000 mg | ORAL_TABLET | Freq: Every day | ORAL | 1 refills | Status: DC

## 2020-10-29 MED ORDER — EMPAGLIFLOZIN 10 MG PO TABS: 10.0000 mg | ORAL_TABLET | Freq: Every day | ORAL | 1 refills | Status: DC

## 2020-10-29 NOTE — Progress Notes (Signed)
OT Cancellation Note  Patient Details Name: [name] MRN: [id number] DOB: Jan 15, 1951   Cancelled Treatment:    Reason Eval/Treat Not Completed: OT screened, no needs identified, will sign off. Patient reports no new physical deficits or OT needs. Has been independent with ADLs and ambulation in room.  [name] [name] 10/29/2020, 3:08 PM

## 2020-10-29 NOTE — Plan of Care (Signed)

## 2020-10-29 NOTE — Discharge Summary (Signed)
DISCHARGE SUMMARY  [name]  MR#: [id number]  DOB:[birth year]  Date of Admission: 10/25/2020 Date of Discharge: 10/29/2020  Attending Physician:Jayten Gabbard Silvestre Gunner, MD  Patient's LEX:[id number], Excell Seltzer., MD  Consults:  Cardiology / Heart Failure Team  Disposition: D/C home    Follow-up Appts:  Follow-up Information     Sl[place]ky, Excell Seltzer., MD Follow up in 1 week(s).   Specialty: Family Medicine Contact information: [address] [place] [address] [phone]         Revankar, Aundra Dubin, MD Follow up in 1 week(s).   Specialty: Cardiology Contact information: [address] [place]  [place] [address] [phone]         Regan Lemming, MD .   Specialty: Cardiology Contact information: 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 -volume overload R heart cath noted only modestly increased biventricular filling pressures and mild mixed Pulm HTN w/ "normal" CO - net negative ~3 L this  admission (+~2L via thora) - BUN beginning to climb - wgt continues to trend down - feels much improved clinically - desires d/c home - counseled at length on need to follow weight trend as indicator of volume status   Right pleural effusion Status postthoracentesis 7/12 with 1.9 L drained - significant symptomatic improvement - c/w transudate and felt to be due to CHF    Acute kidney injury Renal function improved with diuresis -monitor trend as outpt   Hypokalemia Due to diuresis -continue to supplement and follow trend - Mg normal   Left leg wound - venous stasis ulceration has been evaluated by WOC team - no concerning findings on exam presently - cont UNNA wrap for now - is established in a wound care clinic and she confirms to me she has scheduled f/u visit   DM2 CBG well controlled during this hospital stay    HLD Cont usual medical tx   Pressure Injury 10/26/20 Coccyx Stage 2 -  Partial thickness loss of dermis presenting as a shallow open injury with a red, pink wound bed without slough. (Active)  10/26/20 1510  Location: Coccyx  Location Orientation:   Staging: Stage 2 -  Partial thickness loss of dermis presenting as a shallow open injury with a red, pink wound bed without slough.  Wound Description (Comments):   Present on Admission: Yes     Allergies as of 10/29/2020   No Known Allergies      Medication List     STOP taking these medications    furosemide 40 MG  tablet Commonly known as: LASIX       TAKE these medications    aspirin 81 MG EC tablet Take 81 mg by mouth every evening.   carboxymethylcellulose 0.5 % Soln Commonly known as: REFRESH PLUS Place 1 drop into both eyes 3 (three) times daily as needed for dry eyes (dry eye).   empagliflozin 10 MG Tabs tablet Commonly known as: JARDIANCE Take 1 tablet (10 mg total) by mouth daily. Start taking on: October 30, 2020   Eylea 2 MG/0.05ML Soln Generic drug: Aflibercept 2 mg by Intravitreal route  every 8 (eight) weeks. What changed: how much to take   ferrous sulfate 325 (65 FE) MG tablet Take 325 mg by mouth 4 (four) times a week.   metFORMIN 500 MG 24 hr tablet Commonly known as: GLUCOPHAGE-XR Take 500 mg by mouth every evening.   metoprolol succinate 25 MG 24 hr tablet Commonly known as: TOPROL-XL Take 1 tablet (25 mg total) by mouth daily. Take with or immediately following a meal. What changed:  medication strength how much to take   nitroGLYCERIN 0.4 MG SL tablet Commonly known as: NITROSTAT Place 0.4 mg under the tongue every 5 (five) minutes as needed for chest pain.   potassium chloride SA 20 MEQ tablet Commonly known as: KLOR-CON Take 1 tablet (20 mEq total) by mouth daily.   PreserVision AREDS 2+Multi Vit Caps Take 1 Dose by mouth daily.   sacubitril-valsartan 24-26 MG Commonly known as: ENTRESTO Take 1 tablet by mouth 2 (two) times daily.   simvastatin 20 MG tablet Commonly known as: ZOCOR Take 20 mg by mouth every evening.   spironolactone 25 MG tablet Commonly known as: ALDACTONE Take 1 tablet (25 mg total) by mouth daily.   Torsemide 60 MG Tabs Take 60 mg by mouth daily. Start taking on: October 30, 2020        Day of Discharge BP 121/70   Pulse 73   Temp 97.9 F (36.6 C) (Oral)   Resp 20   Ht 5\' 4"  (1.626 m)   Wt 64.4 kg   SpO2 96%   BMI 24.37 kg/m   Physical Exam: General: No acute respiratory distress Lungs: Clear to auscultation bilaterally without wheezes or crackles Cardiovascular: Regular rate and rhythm without murmur gallop or rub normal S1 and S2 Abdomen: Nontender, nondistended, soft, bowel sounds positive, +abdom wall edema  Extremities: L LE wrapped in UNNA dressing - 2+ B LE edema   Basic Metabolic Panel: Recent Labs  Lab 10/26/20 0335 10/27/20 0339 10/27/20 1156 10/27/20 1200 10/28/20 0322 10/29/20 0323  NA 143 142 148*  145 153* 143 141  K 5.5* 3.8 3.1*  3.6 2.5* 3.6 3.5  CL 108 108  --   --  103 99   CO2 26 28  --   --  32 33*  GLUCOSE 140* 98  --   --  104* 102*  BUN 28* 28*  --   --  27* 31*  CREATININE 1.25* 1.14*  --   --  1.11* 1.10*  CALCIUM 8.6* 8.2*  --   --  8.5* 8.4*  MG  --  2.0  --   --  2.0 2.2    Liver Function Tests: Recent Labs  Lab 10/26/20 0335  AST 37  ALT 17  ALKPHOS 181*  BILITOT 1.8*  PROT 6.1*  ALBUMIN 3.4*    Coags: Recent Labs  Lab 10/26/20 0335  INR 1.2    CBC: Recent Labs  Lab 10/26/20  0335 10/27/20 0339 10/27/20 1156 10/27/20 1200 10/28/20 0322  WBC 7.0 6.4  --   --  5.6  NEUTROABS 5.4  --   --   --   --   HGB 15.0 13.2 13.9  15.3* 11.9* 13.5  HCT 47.7* 42.5 41.0  45.0 35.0* 43.6  MCV 99.6 100.5*  --   --  98.2  PLT 185 143*  --   --  157    BNP (last 3 results) Recent Labs    10/26/20 0335  BNP >4,500.0*     CBG: Recent Labs  Lab 10/28/20 1148 10/28/20 1738 10/28/20 2134 10/29/20 0801 10/29/20 1214  GLUCAP 119* 101* 118* 106* 131*    Recent Results (from the past 240 hour(s))  Resp Panel by RT-PCR (Flu A&B, Covid) Nasopharyngeal Swab     Status: None   Collection Time: 10/26/20  3:35 AM   Specimen: Nasopharyngeal Swab; Nasopharyngeal(NP) swabs in vial transport medium  Result Value Ref Range Status   SARS Coronavirus 2 by RT PCR NEGATIVE NEGATIVE Final    Comment: (NOTE) SARS-CoV-2 target nucleic acids are NOT DETECTED.  The SARS-CoV-2 RNA is generally detectable in upper respiratory specimens during the acute phase of infection. The lowest concentration of SARS-CoV-2 viral copies this assay can detect is 138 copies/mL. A negative result does not preclude SARS-Cov-2 infection and should not be used as the sole basis for treatment or other patient management decisions. A negative result may occur with  improper specimen collection/handling, submission of specimen other than nasopharyngeal swab, presence of viral mutation(s) within the areas targeted by this assay, and inadequate number of  viral copies(<138 copies/mL). A negative result must be combined with clinical observations, patient history, and epidemiological information. The expected result is Negative.  Fact Sheet for Patients:  BloggerCourse.com  Fact Sheet for Healthcare Providers:  SeriousBroker.it  This test is no t yet approved or cleared by the [place] FDA and  has been authorized for detection and/or diagnosis of SARS-CoV-2 by FDA under an Emergency Use Authorization (EUA). This EUA will remain  in effect (meaning this test can be used) for the duration of the COVID-19 declaration under Section 564(b)(1) of the Act, 21 U.S.C.section 360bbb-3(b)(1), unless the authorization is terminated  or revoked sooner.       Influenza A by PCR NEGATIVE NEGATIVE Final   Influenza B by PCR NEGATIVE NEGATIVE Final    Comment: (NOTE) The Xpert Xpress SARS-CoV-2/FLU/RSV plus assay is intended as an aid in the diagnosis of influenza from Nasopharyngeal swab specimens and should not be used as a sole basis for treatment. Nasal washings and aspirates are unacceptable for Xpert Xpress SARS-CoV-2/FLU/RSV testing.  Fact Sheet for Patients: BloggerCourse.com  Fact Sheet for Healthcare Providers: SeriousBroker.it  This test is not yet approved or cleared by the [place] FDA and has been authorized for detection and/or diagnosis of SARS-CoV-2 by FDA under an Emergency Use Authorization (EUA). This EUA will remain in effect (meaning this test can be used) for the duration of the COVID-19 declaration under Section 564(b)(1) of the Act, 21 U.S.C. section 360bbb-3(b)(1), unless the authorization is terminated or revoked.  Performed at [facility], [address], [place], [place] [address]   Body fluid culture w Gram Stain     Status: None   Collection Time: 10/26/20 10:45 AM   Specimen:  PATH Cytology Pleural fluid  Result Value Ref Range Status   Specimen Description   Final    PLEURAL Performed  at [facility], [address] [place], [place] [address]    Special Requests   Final    NONE Performed at [facility], [address], [place], [place] [address]    Gram Stain   Final    RARE WBC PRESENT, PREDOMINANTLY MONONUCLEAR NO ORGANISMS SEEN    Culture   Final    NO GROWTH 3 DAYS Performed at [facility], [address], [place], [place] [address]    Report Status 10/29/2020 FINAL  Final      Time spent in discharge (includes decision making & examination of pt): 35 minutes  10/29/2020, 3:11 PM   Lonia Blood, MD [facility] Office  [phone]

## 2020-10-29 NOTE — Plan of Care (Signed)
Problem: Education: Goal: Knowledge of General Education information will improve Description: Including pain rating scale, medication(s)/side effects and non-pharmacologic comfort measures 10/29/2020 1513 by Marguerita Beards, RN Outcome: Adequate for Discharge 10/29/2020 0806 by Marguerita Beards, RN Outcome: Progressing 10/29/2020 0747 by Marguerita Beards, RN Outcome: Progressing   Problem: Health Behavior/Discharge Planning: Goal: Ability to manage health-related needs will improve 10/29/2020 1513 by Marguerita Beards, RN Outcome: Adequate for Discharge 10/29/2020 0806 by Marguerita Beards, RN Outcome: Progressing 10/29/2020 0747 by Marguerita Beards, RN Outcome: Progressing   Problem: Clinical Measurements: Goal: Ability to maintain clinical measurements within normal limits will improve 10/29/2020 1513 by Marguerita Beards, RN Outcome: Adequate for Discharge 10/29/2020 0806 by Marguerita Beards, RN Outcome: Progressing 10/29/2020 0747 by Marguerita Beards, RN Outcome: Progressing Goal: Will remain free from infection 10/29/2020 1513 by Marguerita Beards, RN Outcome: Adequate for Discharge 10/29/2020 0806 by Marguerita Beards, RN Outcome: Progressing 10/29/2020 0747 by Marguerita Beards, RN Outcome: Progressing Goal: Diagnostic test results will improve 10/29/2020 1513 by Marguerita Beards, RN Outcome: Adequate for Discharge 10/29/2020 0806 by Marguerita Beards, RN Outcome: Progressing 10/29/2020 0747 by Marguerita Beards, RN Outcome: Progressing Goal: Respiratory complications will improve 10/29/2020 1513 by Marguerita Beards, RN Outcome: Adequate for Discharge 10/29/2020 0806 by Marguerita Beards, RN Outcome: Progressing 10/29/2020 0747 by Marguerita Beards, RN Outcome: Progressing Goal: Cardiovascular complication will be avoided 10/29/2020 1513 by Marguerita Beards, RN Outcome: Adequate for Discharge 10/29/2020 0806 by Marguerita Beards, RN Outcome: Progressing 10/29/2020 0747 by Marguerita Beards, RN Outcome: Progressing   Problem: Activity: Goal:  Risk for activity intolerance will decrease 10/29/2020 1513 by Marguerita Beards, RN Outcome: Adequate for Discharge 10/29/2020 0806 by Marguerita Beards, RN Outcome: Progressing 10/29/2020 0747 by Marguerita Beards, RN Outcome: Progressing   Problem: Nutrition: Goal: Adequate nutrition will be maintained 10/29/2020 1513 by Marguerita Beards, RN Outcome: Adequate for Discharge 10/29/2020 0806 by Marguerita Beards, RN Outcome: Progressing 10/29/2020 0747 by Marguerita Beards, RN Outcome: Progressing   Problem: Coping: Goal: Level of anxiety will decrease 10/29/2020 1513 by Marguerita Beards, RN Outcome: Adequate for Discharge 10/29/2020 0806 by Marguerita Beards, RN Outcome: Progressing 10/29/2020 0747 by Marguerita Beards, RN Outcome: Progressing   Problem: Elimination: Goal: Will not experience complications related to bowel motility 10/29/2020 1513 by Marguerita Beards, RN Outcome: Adequate for Discharge 10/29/2020 0806 by Marguerita Beards, RN Outcome: Progressing 10/29/2020 0747 by Marguerita Beards, RN Outcome: Progressing Goal: Will not experience complications related to urinary retention 10/29/2020 1513 by Marguerita Beards, RN Outcome: Adequate for Discharge 10/29/2020 0806 by Marguerita Beards, RN Outcome: Progressing 10/29/2020 0747 by Marguerita Beards, RN Outcome: Progressing   Problem: Pain Managment: Goal: General experience of comfort will improve 10/29/2020 1513 by Marguerita Beards, RN Outcome: Adequate for Discharge 10/29/2020 0806 by Marguerita Beards, RN Outcome: Progressing 10/29/2020 0747 by Marguerita Beards, RN Outcome: Progressing   Problem: Safety: Goal: Ability to remain free from injury will improve 10/29/2020 1513 by Marguerita Beards, RN Outcome: Adequate for Discharge 10/29/2020 0806 by Marguerita Beards, RN Outcome: Progressing 10/29/2020 0747 by Marguerita Beards, RN Outcome: Progressing   Problem: Skin Integrity: Goal: Risk for impaired skin integrity will decrease 10/29/2020 1513 by Marguerita Beards, RN Outcome: Adequate for  Discharge 10/29/2020 0806 by Marguerita Beards, RN Outcome: Progressing 10/29/2020 0747 by Marguerita Beards, RN Outcome: Progressing   Problem: Education: Goal: Ability to demonstrate management of disease process will improve 10/29/2020 1513 by Marguerita Beards, RN Outcome: Adequate for Discharge 10/29/2020 0806 by Marguerita Beards, RN Outcome: Progressing 10/29/2020 0747 by Marguerita Beards, RN Outcome: Progressing Goal: Ability to  verbalize understanding of medication therapies will improve 10/29/2020 1513 by Marguerita Beards, RN Outcome: Adequate for Discharge 10/29/2020 0806 by Marguerita Beards, RN Outcome: Progressing 10/29/2020 0747 by Marguerita Beards, RN Outcome: Progressing Goal: Individualized Educational Video(s) 10/29/2020 1513 by Marguerita Beards, RN Outcome: Adequate for Discharge 10/29/2020 0806 by Marguerita Beards, RN Outcome: Progressing 10/29/2020 0747 by Marguerita Beards, RN Outcome: Progressing   Problem: Activity: Goal: Capacity to carry out activities will improve 10/29/2020 1513 by Marguerita Beards, RN Outcome: Adequate for Discharge 10/29/2020 0806 by Marguerita Beards, RN Outcome: Progressing 10/29/2020 0747 by Marguerita Beards, RN Outcome: Progressing   Problem: Cardiac: Goal: Ability to achieve and maintain adequate cardiopulmonary perfusion will improve 10/29/2020 1513 by Marguerita Beards, RN Outcome: Adequate for Discharge 10/29/2020 0806 by Marguerita Beards, RN Outcome: Progressing 10/29/2020 0747 by Marguerita Beards, RN Outcome: Progressing

## 2020-10-29 NOTE — Plan of Care (Signed)
  Problem: Education: Goal: Knowledge of General Education information will improve Description: Including pain rating scale, medication(s)/side effects and non-pharmacologic comfort measures 10/29/2020 0806 by Marguerita Beards, RN Outcome: Progressing 10/29/2020 0747 by Marguerita Beards, RN Outcome: Progressing   Problem: Health Behavior/Discharge Planning: Goal: Ability to manage health-related needs will improve 10/29/2020 0806 by Marguerita Beards, RN Outcome: Progressing 10/29/2020 0747 by Marguerita Beards, RN Outcome: Progressing   Problem: Clinical Measurements: Goal: Ability to maintain clinical measurements within normal limits will improve 10/29/2020 0806 by Marguerita Beards, RN Outcome: Progressing 10/29/2020 0747 by Marguerita Beards, RN Outcome: Progressing Goal: Will remain free from infection 10/29/2020 0806 by Marguerita Beards, RN Outcome: Progressing 10/29/2020 0747 by Marguerita Beards, RN Outcome: Progressing Goal: Diagnostic test results will improve 10/29/2020 0806 by Marguerita Beards, RN Outcome: Progressing 10/29/2020 0747 by Marguerita Beards, RN Outcome: Progressing Goal: Respiratory complications will improve 10/29/2020 0806 by Marguerita Beards, RN Outcome: Progressing 10/29/2020 0747 by Marguerita Beards, RN Outcome: Progressing Goal: Cardiovascular complication will be avoided 10/29/2020 0806 by Marguerita Beards, RN Outcome: Progressing 10/29/2020 0747 by Marguerita Beards, RN Outcome: Progressing   Problem: Activity: Goal: Risk for activity intolerance will decrease 10/29/2020 0806 by Marguerita Beards, RN Outcome: Progressing 10/29/2020 0747 by Marguerita Beards, RN Outcome: Progressing   Problem: Nutrition: Goal: Adequate nutrition will be maintained 10/29/2020 0806 by Marguerita Beards, RN Outcome: Progressing 10/29/2020 0747 by Marguerita Beards, RN Outcome: Progressing   Problem: Coping: Goal: Level of anxiety will decrease 10/29/2020 0806 by Marguerita Beards, RN Outcome: Progressing 10/29/2020 0747 by  Marguerita Beards, RN Outcome: Progressing   Problem: Elimination: Goal: Will not experience complications related to bowel motility 10/29/2020 0806 by Marguerita Beards, RN Outcome: Progressing 10/29/2020 0747 by Marguerita Beards, RN Outcome: Progressing Goal: Will not experience complications related to urinary retention 10/29/2020 0806 by Marguerita Beards, RN Outcome: Progressing 10/29/2020 0747 by Marguerita Beards, RN Outcome: Progressing   Problem: Pain Managment: Goal: General experience of comfort will improve 10/29/2020 0806 by Marguerita Beards, RN Outcome: Progressing 10/29/2020 0747 by Marguerita Beards, RN Outcome: Progressing   Problem: Safety: Goal: Ability to remain free from injury will improve 10/29/2020 0806 by Marguerita Beards, RN Outcome: Progressing 10/29/2020 0747 by Marguerita Beards, RN Outcome: Progressing   Problem: Skin Integrity: Goal: Risk for impaired skin integrity will decrease 10/29/2020 0806 by Marguerita Beards, RN Outcome: Progressing 10/29/2020 0747 by Marguerita Beards, RN Outcome: Progressing   Problem: Education: Goal: Ability to demonstrate management of disease process will improve 10/29/2020 0806 by Marguerita Beards, RN Outcome: Progressing 10/29/2020 0747 by Marguerita Beards, RN Outcome: Progressing Goal: Ability to verbalize understanding of medication therapies will improve 10/29/2020 0806 by Marguerita Beards, RN Outcome: Progressing 10/29/2020 0747 by Marguerita Beards, RN Outcome: Progressing Goal: Individualized Educational Video(s) 10/29/2020 0806 by Marguerita Beards, RN Outcome: Progressing 10/29/2020 0747 by Marguerita Beards, RN Outcome: Progressing   Problem: Activity: Goal: Capacity to carry out activities will improve 10/29/2020 0806 by Marguerita Beards, RN Outcome: Progressing 10/29/2020 0747 by Marguerita Beards, RN Outcome: Progressing   Problem: Cardiac: Goal: Ability to achieve and maintain adequate cardiopulmonary perfusion will improve 10/29/2020 0806 by Marguerita Beards,  RN Outcome: Progressing 10/29/2020 0747 by Marguerita Beards, RN Outcome: Progressing

## 2020-10-29 NOTE — Discharge Instructions (Signed)
Heart Failure  SPECIAL INSTRUCTIONS AVOID STRAINING STOP ANY ACTIVITY THAT CAUSES CHEST PAIN, SHORTNESS OF BREATH, DIZZINESS, SWEATING, OR EXCESSIVE WEAKNESS.  SPECIAL INSTRUCTIONS FOR PATIENTS WITH HEART FAILURE: Continue to follow the instructions in your Heart Failure Patient Education information that you received during your hospital stay. Record daily weight on same scale at same time of day. If your doctor did not discuss your diet or activity in the information above, please follow a Heart Healthy Low Sodium diet and increase your activity as you feel able. Call your doctor: (Anytime you feel any of the following symptoms) 3-4 pound weight gain in 1-2 days or 2 pounds overnight Shortness of breath, with or without a dry hacking cough Swelling in the hands, feet or stomach If you have to sleep on extra pillows at night in order to breathe   

## 2020-10-29 NOTE — Progress Notes (Addendum)
Progress Note  Patient Name: [name] Date of Encounter: 10/29/2020  [facility] Cardiologist: Garwin Brothers, MD   Subjective   No issues overnight - feels well  Inpatient Medications    Scheduled Meds:  aspirin EC  81 mg Oral QPM   empagliflozin  10 mg Oral Daily   enoxaparin (LOVENOX) injection  40 mg Subcutaneous Q24H   insulin aspart  0-5 Units Subcutaneous QHS   insulin aspart  0-9 Units Subcutaneous TID WC   metoprolol tartrate  12.5 mg Oral BID   multivitamin  1 tablet Oral Daily   sacubitril-valsartan  1 tablet Oral BID   sodium chloride flush  3 mL Intravenous Q12H   spironolactone  25 mg Oral Daily   torsemide  60 mg Oral Daily   Continuous Infusions:   PRN Meds: acetaminophen, ondansetron (ZOFRAN) IV, polyvinyl alcohol   Vital Signs    Vitals:   10/28/20 1150 10/28/20 2136 10/29/20 0439 10/29/20 0637  BP: 120/77 112/82 116/76   Pulse: 75 78 71   Resp: 19 16 16    Temp: 97.9 F (36.6 C) 98 F (36.7 C) 97.7 F (36.5 C)   TempSrc: Oral Oral Oral   SpO2: 93% 95% 96%   Weight:    64.4 kg  Height:        Intake/Output Summary (Last 24 hours) at 10/29/2020 1200 Last data filed at 10/28/2020 1406 Gross per 24 hour  Intake 240 ml  Output 600 ml  Net -360 ml   Last 3 Weights 10/29/2020 10/28/2020 10/27/2020  Weight (lbs) 142 lb 153 lb 3.5 oz 157 lb 3 oz  Weight (kg) 64.411 kg 69.5 kg 71.3 kg      Telemetry    SR with PACs and PVCs one episode of SVT for 11 sec last pm.  - Personally Reviewed  ECG    No new - Personally Reviewed  Physical Exam   GEN: No acute distress.   Neck: no JVD Cardiac: RRR, no murmurs, rubs, or gallops.  Respiratory: Clear to diminished to auscultation bilaterally. GI: Soft, nontender, non-distended  MS: 1+ edema, left lower leg wrapped Neuro:  Nonfocal  Psych: Normal affect   Labs    High Sensitivity Troponin:   Recent Labs  Lab 10/26/20 0335 10/26/20 0521  TROPONINIHS 90* 92*       Chemistry Recent Labs  Lab 10/26/20 0335 10/27/20 0339 10/27/20 1156 10/27/20 1200 10/28/20 0322 10/29/20 0323  NA 143 142   < > 153* 143 141  K 5.5* 3.8   < > 2.5* 3.6 3.5  CL 108 108  --   --  103 99  CO2 26 28  --   --  32 33*  GLUCOSE 140* 98  --   --  104* 102*  BUN 28* 28*  --   --  27* 31*  CREATININE 1.25* 1.14*  --   --  1.11* 1.10*  CALCIUM 8.6* 8.2*  --   --  8.5* 8.4*  PROT 6.1*  --   --   --   --   --   ALBUMIN 3.4*  --   --   --   --   --   AST 37  --   --   --   --   --   ALT 17  --   --   --   --   --   ALKPHOS 181*  --   --   --   --   --  BILITOT 1.8*  --   --   --   --   --   GFRNONAA 46* 52*  --   --  53* 54*  ANIONGAP 9 6  --   --  8 9   < > = values in this interval not displayed.     Hematology Recent Labs  Lab 10/26/20 0335 10/27/20 0339 10/27/20 1156 10/27/20 1200 10/28/20 0322  WBC 7.0 6.4  --   --  5.6  RBC 4.79 4.23  --   --  4.44  HGB 15.0 13.2 13.9  15.3* 11.9* 13.5  HCT 47.7* 42.5 41.0  45.0 35.0* 43.6  MCV 99.6 100.5*  --   --  98.2  MCH 31.3 31.2  --   --  30.4  MCHC 31.4 31.1  --   --  31.0  RDW 16.2* 16.3*  --   --  15.9*  PLT 185 143*  --   --  157    BNP Recent Labs  Lab 10/26/20 0335  BNP >4,500.0*     DDimer No results for input(s): DDIMER in the last 168 hours.   Radiology    No results found.  Cardiac Studies   RHC 10/27/20 Findings:   RA = 11 RV = 56/12 PA = 50/21 (34) PCW = 20 Fick cardiac output/index = 4.8/2.7 PVR = 2.9 WU Ao sat = 99% PA sat = 71%, 72% SVC sat = 68% PaPi = 2.6   Assessment: 1. Mildly elevated biventricular filling pressures 2. Mild mixed pulmonary HTN 3. Normal cardiac output   Plan/Discussion:   Continue medical therapy.   Arvilla Meres, MD  12:17 PM  Echo 10/26/20 IMPRESSIONS     1. Left ventricular ejection fraction, by estimation, is <20%. The left  ventricle has severely decreased function. The left ventricle demonstrates  global hypokinesis. Left  ventricular diastolic parameters are consistent  with Grade II diastolic  dysfunction (pseudonormalization). Elevated left ventricular end-diastolic  pressure.   2. Right ventricular systolic function is moderately reduced. The right  ventricular size is normal. There is mildly elevated pulmonary artery  systolic pressure. The estimated right ventricular systolic pressure is  43.0 mmHg.   3. Left atrial size was severely dilated.   4. Right atrial size was severely dilated.   5. The mitral valve is normal in structure. Trivial mitral valve  regurgitation. No evidence of mitral stenosis.   6. Tricuspid valve regurgitation is mild to moderate.   7. The aortic valve is tricuspid. Aortic valve regurgitation is trivial.  Mild aortic valve sclerosis is present, with no evidence of aortic valve  stenosis.   8. The inferior vena cava is normal in size with <50% respiratory  variability, suggesting right atrial pressure of 8 mmHg.   FINDINGS   Left Ventricle: Left ventricular ejection fraction, by estimation, is  <20%. The left ventricle has severely decreased function. The left  ventricle demonstrates global hypokinesis. The left ventricular internal  cavity size was normal in size. There is no   left ventricular hypertrophy. Left ventricular diastolic parameters are  consistent with Grade II diastolic dysfunction (pseudonormalization).  Elevated left ventricular end-diastolic pressure.   Right Ventricle: The right ventricular size is normal. No increase in  right ventricular wall thickness. Right ventricular systolic function is  moderately reduced. There is mildly elevated pulmonary artery systolic  pressure. The tricuspid regurgitant  velocity is 2.96 m/s, and with an assumed right atrial pressure of 8 mmHg,  the estimated right ventricular systolic  pressure is 43.0 mmHg.   Left Atrium: Left atrial size was severely dilated.   Right Atrium: Right atrial size was severely dilated.    Pericardium: There is no evidence of pericardial effusion.   Mitral Valve: The mitral valve is normal in structure. Trivial mitral  valve regurgitation. No evidence of mitral valve stenosis.   Tricuspid Valve: The tricuspid valve is normal in structure. Tricuspid  valve regurgitation is mild to moderate. No evidence of tricuspid  stenosis.   Aortic Valve: The aortic valve is tricuspid. Aortic valve regurgitation is  trivial. Mild aortic valve sclerosis is present, with no evidence of  aortic valve stenosis.   Pulmonic Valve: The pulmonic valve was normal in structure. Pulmonic valve  regurgitation is trivial. No evidence of pulmonic stenosis.   Aorta: The aortic root is normal in size and structure.   Venous: The inferior vena cava is normal in size with less than 50%  respiratory variability, suggesting right atrial pressure of 8 mmHg.   IAS/Shunts: No atrial level shunt detected by color flow Doppler.       Patient Profile     70 y.o. 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   Assessment & Plan    CHF acute on chronic combined systolic and diastolic CHF  -pressures improving -almost net even overnight -Cr stable - added Jardiance 10 mg daily yesterday - on Entresto 24/26, aldactone 25 mg daily, metop 12.5 mg BID, and torsemide 60 mg daily  Pl effusion with throacentesis with almost 2 L removed    Pulmonary HTN -medical therapy.   HTN stable on Entresto   DM-2 per IM  Ok for d/c home today-  needs follow-up with Dr. Josiah Lobo in [place] after discharge. This is already scheduled on 7/22.    For questions or updates, please contact [facility] Please consult [email or web address] for contact info under   Chrystie Nose, MD, Milagros Loll   [place]  [facility]  Medical Director of the [facility] Lipid Disorders &  Cardiovascular Risk Reduction Clinic Diplomate of the American Board of Clinical Lipidology Attending Cardiologist  Direct Dial: [phone]  Fax: [phone]  Website:  [email or web address]  Chrystie Nose, MD  10/29/2020, 12:00 PM

## 2020-10-29 NOTE — Progress Notes (Signed)
Discharge paperwork given and explained to patient utilizing Teachback method. PIV removed. Pressure dressing applied. Pt awaiting transport.

## 2020-11-05 ENCOUNTER — Ambulatory Visit (INDEPENDENT_AMBULATORY_CARE_PROVIDER_SITE_OTHER): Payer: Medicare Other | Admitting: Cardiology

## 2020-11-05 ENCOUNTER — Other Ambulatory Visit: Payer: Self-pay

## 2020-11-05 ENCOUNTER — Encounter: Payer: Self-pay | Admitting: Cardiology

## 2020-11-05 VITALS — BP 126/76 | HR 84 | Ht 64.0 in | Wt 122.0 lb

## 2020-11-05 DIAGNOSIS — I2510 Atherosclerotic heart disease of native coronary artery without angina pectoris: Secondary | ICD-10-CM | POA: Diagnosis not present

## 2020-11-05 DIAGNOSIS — I428 Other cardiomyopathies: Secondary | ICD-10-CM | POA: Diagnosis not present

## 2020-11-05 DIAGNOSIS — E782 Mixed hyperlipidemia: Secondary | ICD-10-CM

## 2020-11-05 NOTE — Progress Notes (Signed)
Cardiology Office Note:    Date:  11/05/2020   ID:  [name], DOB 1950/09/05, MRN [id number]  PCP:  [name], MD  Cardiologist:  Garwin Brothers, MD   Referring MD: [name], MD    ASSESSMENT:    1. Mixed hyperlipidemia   2. Nonischemic cardiomyopathy (HCC)   3. Coronary artery disease involving native coronary artery of native heart without angina pectoris    PLAN:    In order of problems listed above:  Coronary artery disease: Secondary prevention stressed with patient.  Importance of compliance with diet medication stressed and she vocalized understanding. Congestive heart failure and advanced cardiomyopathy: Patient has severely depressed ejection fraction.  She seems to be keeping her log of her blood pressures and weight very meticulously.  Diet was emphasized at extensive length and she promises to stay very focused about this.  She has an appointment for September for echocardiogram given to her by her colleagues and she will follow-up with our electrophysiology colleagues for the possibility of further evaluation.  This will be depending on her ejection fraction assessment in the future. Diabetes mellitus and mixed dyslipidemia: Diet was emphasized.  She is on statin therapy. Patient will be seen in follow-up appointment in 2 months or earlier if the patient has any concerns    Medication Adjustments/Labs and Tests Ordered: Current medicines are reviewed at length with the patient today.  Concerns regarding medicines are outlined above.  No orders of the defined types were placed in this encounter.  No orders of the defined types were placed in this encounter.    No chief complaint on file.    History of Present Illness:    [name] is a 70 y.o. female.  Patient has advanced cardiomyopathy, Renee dysfunction.  She has mitral regurgitation.  She was admitted to the hospital for congestive heart failure and treated and she is on  guideline directed medical therapy at this time.  She mentions to me that she underwent thoracocentesis and felt also much better.  She has lost a significant amount of weight since last evaluation.  She feels much better.  At the time of my evaluation, the patient is alert awake oriented and in no distress.  She has an appointment for echocardiogram in September.  Past Medical History:  Diagnosis Date   Abnormal nuclear stress test 03/29/2020   Chronic combined systolic and diastolic CHF (congestive heart failure) (HCC)    CKD (chronic kidney disease), stage II    Depression    Diabetes mellitus, type 2 (HCC)    DVT (deep venous thrombosis) (HCC) 07/2011   S/P coumadin x 6 months   Dyspnea on exertion 03/19/2020   HTN (hypertension)    Hyperlipidemia    Hypertension    Hypokalemia 05/08/2012   Iron deficiency anemia    Leukocytosis 05/08/2012   Mitral regurgitation    Nonischemic cardiomyopathy (HCC) 04/21/2020   Pericardial effusion    small by echo 07/2020   Pulmonary hypertension (HCC)    Tobacco abuse 05/08/2012   Tricuspid regurgitation     Past Surgical History:  Procedure Laterality Date   APPENDECTOMY     Bilateral oophrectomy     Secondary to benign cysts   colonoscopy     with polypectomy   LEFT HEART CATH AND CORONARY ANGIOGRAPHY N/A 03/29/2020   Procedure: LEFT HEART CATH AND CORONARY ANGIOGRAPHY;  Surgeon: Marykay Lex, MD;  Location: [facility] INVASIVE CV LAB;  Service: Cardiovascular;  Laterality: N/A;   RIGHT HEART CATH N/A 10/27/2020   Procedure: RIGHT HEART CATH;  Surgeon: Dolores Patty, MD;  Location: [facility] INVASIVE CV LAB;  Service: Cardiovascular;  Laterality: N/A;   TONSILLECTOMY      Current Medications: Current Meds  Medication Sig   Aflibercept (EYLEA) 2 MG/0.05ML SOLN 2 mg by Intravitreal route every 8 (eight) weeks.   aspirin 81 MG EC tablet Take 81 mg by mouth every evening.    carboxymethylcellulose (REFRESH PLUS) 0.5 % SOLN Place 1 drop into  both eyes 3 (three) times daily as needed for dry eyes (dry eye).   empagliflozin (JARDIANCE) 10 MG TABS tablet Take 1 tablet (10 mg total) by mouth daily.   ferrous sulfate 325 (65 FE) MG tablet Take 325 mg by mouth 4 (four) times a week.   metFORMIN (GLUCOPHAGE-XR) 500 MG 24 hr tablet Take 500 mg by mouth every evening.    metoprolol succinate (TOPROL-XL) 25 MG 24 hr tablet Take 1 tablet (25 mg total) by mouth daily. Take with or immediately following a meal.   Multiple Vitamins-Minerals (PRESERVISION AREDS 2+MULTI VIT) CAPS Take 1 Dose by mouth daily.   nitroGLYCERIN (NITROSTAT) 0.4 MG SL tablet Place 0.4 mg under the tongue every 5 (five) minutes as needed for chest pain.   potassium chloride SA (KLOR-CON) 20 MEQ tablet Take 1 tablet (20 mEq total) by mouth daily.   sacubitril-valsartan (ENTRESTO) 24-26 MG Take 1 tablet by mouth 2 (two) times daily.   simvastatin (ZOCOR) 20 MG tablet Take 20 mg by mouth every evening.    spironolactone (ALDACTONE) 25 MG tablet Take 1 tablet (25 mg total) by mouth daily.   torsemide (DEMADEX) 20 MG tablet Take 60 mg by mouth daily.     Allergies:   Patient has no known allergies.   Social History   Socioeconomic History   Marital status: Married    Spouse name: Linley Moskal   Number of children: 2   Years of education: Not on file   Highest education level: Not on file  Occupational History   Occupation: Production manager for a company    Employer: [employer]  Tobacco Use   Smoking status: Every Day    Packs/day: 0.50    Years: 40.00    Pack years: 20.00    Types: Cigarettes   Smokeless tobacco: Never  Substance and Sexual Activity   Alcohol use: Yes    Alcohol/week: 15.0 standard drinks    Types: 15 Cans of beer per week   Drug use: No   Sexual activity: Never  Other Topics Concern   Not on file  Social History Narrative   Married.  Lives in husband here in [place].  Normally independent of ADLs.   Social Determinants of Health   Financial  Resource Strain: Not on file  Food Insecurity: Not on file  Transportation Needs: Not on file  Physical Activity: Not on file  Stress: Not on file  Social Connections: Not on file     Family History: The patient's family history includes Alcoholism in her father; Breast cancer in her mother; CAD in her paternal grandmother; COPD in her mother; Diabetes in her maternal grandmother and paternal grandmother; Liver disease in her father; Stroke in her maternal grandfather and paternal grandfather; Uterine cancer in her mother.  ROS:   Please see the history of present illness.    All other systems reviewed and are negative.  EKGs/Labs/Other Studies Reviewed:    The following studies were  reviewed today: IMPRESSIONS     1. Left ventricular ejection fraction, by estimation, is <20%. The left  ventricle has severely decreased function. The left ventricle demonstrates  global hypokinesis. Left ventricular diastolic parameters are consistent  with Grade II diastolic  dysfunction (pseudonormalization). Elevated left ventricular end-diastolic  pressure.   2. Right ventricular systolic function is moderately reduced. The right  ventricular size is normal. There is mildly elevated pulmonary artery  systolic pressure. The estimated right ventricular systolic pressure is  43.0 mmHg.   3. Left atrial size was severely dilated.   4. Right atrial size was severely dilated.   5. The mitral valve is normal in structure. Trivial mitral valve  regurgitation. No evidence of mitral stenosis.   6. Tricuspid valve regurgitation is mild to moderate.   7. The aortic valve is tricuspid. Aortic valve regurgitation is trivial.  Mild aortic valve sclerosis is present, with no evidence of aortic valve  stenosis.   8. The inferior vena cava is normal in size with <50% respiratory  variability, suggesting right atrial pressure of 8 mmHg.    Recent Labs: 10/26/2020: ALT 17; B Natriuretic Peptide >4,500.0; TSH  1.443 10/28/2020: Hemoglobin 13.5; Platelets 157 10/29/2020: BUN 31; Creatinine, Ser 1.10; Magnesium 2.2; Potassium 3.5; Sodium 141  Recent Lipid Panel No results found for: CHOL, TRIG, HDL, CHOLHDL, VLDL, LDLCALC, LDLDIRECT  Physical Exam:    VS:  BP 126/76   Pulse 84   Ht 5\' 4"  (1.626 m)   Wt 122 lb (55.3 kg)   SpO2 96%   BMI 20.94 kg/m     Wt Readings from Last 3 Encounters:  11/05/20 122 lb (55.3 kg)  10/29/20 142 lb (64.4 kg)  10/19/20 169 lb (76.7 kg)     GEN: Patient is in no acute distress HEENT: Normal NECK: No JVD; No carotid bruits LYMPHATICS: No lymphadenopathy CARDIAC: Hear sounds regular, 2/6 systolic murmur at the apex. RESPIRATORY:  Clear to auscultation without rales, wheezing or rhonchi  ABDOMEN: Soft, non-tender, non-distended MUSCULOSKELETAL:  No edema; No deformity  SKIN: Warm and dry NEUROLOGIC:  Alert and oriented x 3 PSYCHIATRIC:  Normal affect   Signed, 12/20/20, MD  11/05/2020 3:11 PM    [facility]

## 2020-11-05 NOTE — Addendum Note (Signed)
Addended by: Eleonore Chiquito on: 11/05/2020 03:22 PM   Modules accepted: Orders

## 2020-11-05 NOTE — Patient Instructions (Signed)
Medication Instructions:  No medication changes. *If you need a refill on your cardiac medications before your next appointment, please call your pharmacy*   Lab Work: Your physician recommends that you return for lab work in: 1 week for a BMET.  If you have labs (blood work) drawn today and your tests are completely normal, you will receive your results only by: MyChart Message (if you have MyChart) OR A paper copy in the mail If you have any lab test that is abnormal or we need to change your treatment, we will call you to review the results.   Testing/Procedures: None ordered   Follow-Up: At [facility], you and your health needs are our priority.  As part of our continuing mission to provide you with exceptional heart care, we have created designated Provider Care Teams.  These Care Teams include your primary Cardiologist (physician) and Advanced Practice Providers (APPs -  Physician Assistants and Nurse Practitioners) who all work together to provide you with the care you need, when you need it.  We recommend signing up for the patient portal called "MyChart".  Sign up information is provided on this After Visit Summary.  MyChart is used to connect with patients for Virtual Visits (Telemedicine).  Patients are able to view lab/test results, encounter notes, upcoming appointments, etc.  Non-urgent messages can be sent to your provider as well.   To learn more about what you can do with MyChart, go to [email or web address].    Your next appointment:   2 month(s)  The format for your next appointment:   In Person  Provider:   Belva Crome, MD   Other Instructions NA

## 2020-11-08 ENCOUNTER — Encounter: Payer: Self-pay | Admitting: Cardiology

## 2020-11-08 ENCOUNTER — Ambulatory Visit: Payer: Medicare Other

## 2020-11-13 ENCOUNTER — Inpatient Hospital Stay (HOSPITAL_COMMUNITY)
Admission: EM | Admit: 2020-11-13 | Discharge: 2020-11-15 | DRG: 314 | Disposition: A | Discharge status: Home / Self Care | Payer: Medicare Other | Attending: Cardiovascular Disease | Admitting: Cardiovascular Disease

## 2020-11-13 ENCOUNTER — Encounter (HOSPITAL_COMMUNITY): Payer: Self-pay

## 2020-11-13 ENCOUNTER — Emergency Department (HOSPITAL_COMMUNITY): Payer: Medicare Other

## 2020-11-13 ENCOUNTER — Other Ambulatory Visit: Payer: Self-pay

## 2020-11-13 DIAGNOSIS — Z825 Family history of asthma and other chronic lower respiratory diseases: Secondary | ICD-10-CM

## 2020-11-13 DIAGNOSIS — Z681 Body mass index (BMI) 19 or less, adult: Secondary | ICD-10-CM

## 2020-11-13 DIAGNOSIS — I5043 Acute on chronic combined systolic (congestive) and diastolic (congestive) heart failure: Secondary | ICD-10-CM | POA: Diagnosis present

## 2020-11-13 DIAGNOSIS — Z833 Family history of diabetes mellitus: Secondary | ICD-10-CM

## 2020-11-13 DIAGNOSIS — I209 Angina pectoris, unspecified: Secondary | ICD-10-CM | POA: Diagnosis present

## 2020-11-13 DIAGNOSIS — Z79899 Other long term (current) drug therapy: Secondary | ICD-10-CM

## 2020-11-13 DIAGNOSIS — I319 Disease of pericardium, unspecified: Secondary | ICD-10-CM

## 2020-11-13 DIAGNOSIS — Z7984 Long term (current) use of oral hypoglycemic drugs: Secondary | ICD-10-CM

## 2020-11-13 DIAGNOSIS — N183 Chronic kidney disease, stage 3 unspecified: Secondary | ICD-10-CM | POA: Diagnosis present

## 2020-11-13 DIAGNOSIS — I2722 Pulmonary hypertension due to left heart disease: Secondary | ICD-10-CM | POA: Diagnosis present

## 2020-11-13 DIAGNOSIS — I5041 Acute combined systolic (congestive) and diastolic (congestive) heart failure: Secondary | ICD-10-CM | POA: Diagnosis present

## 2020-11-13 DIAGNOSIS — E1122 Type 2 diabetes mellitus with diabetic chronic kidney disease: Secondary | ICD-10-CM | POA: Diagnosis present

## 2020-11-13 DIAGNOSIS — I10 Essential (primary) hypertension: Secondary | ICD-10-CM | POA: Diagnosis present

## 2020-11-13 DIAGNOSIS — E785 Hyperlipidemia, unspecified: Secondary | ICD-10-CM

## 2020-11-13 DIAGNOSIS — Z8049 Family history of malignant neoplasm of other genital organs: Secondary | ICD-10-CM

## 2020-11-13 DIAGNOSIS — D509 Iron deficiency anemia, unspecified: Secondary | ICD-10-CM | POA: Diagnosis present

## 2020-11-13 DIAGNOSIS — R079 Chest pain, unspecified: Secondary | ICD-10-CM

## 2020-11-13 DIAGNOSIS — Z823 Family history of stroke: Secondary | ICD-10-CM

## 2020-11-13 DIAGNOSIS — E119 Type 2 diabetes mellitus without complications: Secondary | ICD-10-CM

## 2020-11-13 DIAGNOSIS — I428 Other cardiomyopathies: Secondary | ICD-10-CM

## 2020-11-13 DIAGNOSIS — N179 Acute kidney failure, unspecified: Secondary | ICD-10-CM | POA: Diagnosis present

## 2020-11-13 DIAGNOSIS — I300 Acute nonspecific idiopathic pericarditis: Secondary | ICD-10-CM

## 2020-11-13 DIAGNOSIS — R64 Cachexia: Secondary | ICD-10-CM | POA: Diagnosis present

## 2020-11-13 DIAGNOSIS — Z86718 Personal history of other venous thrombosis and embolism: Secondary | ICD-10-CM

## 2020-11-13 DIAGNOSIS — Z8249 Family history of ischemic heart disease and other diseases of the circulatory system: Secondary | ICD-10-CM

## 2020-11-13 DIAGNOSIS — F17210 Nicotine dependence, cigarettes, uncomplicated: Secondary | ICD-10-CM | POA: Diagnosis present

## 2020-11-13 DIAGNOSIS — I309 Acute pericarditis, unspecified: Secondary | ICD-10-CM | POA: Diagnosis not present

## 2020-11-13 DIAGNOSIS — Z803 Family history of malignant neoplasm of breast: Secondary | ICD-10-CM

## 2020-11-13 DIAGNOSIS — I130 Hypertensive heart and chronic kidney disease with heart failure and stage 1 through stage 4 chronic kidney disease, or unspecified chronic kidney disease: Secondary | ICD-10-CM | POA: Diagnosis present

## 2020-11-13 DIAGNOSIS — I248 Other forms of acute ischemic heart disease: Secondary | ICD-10-CM | POA: Diagnosis present

## 2020-11-13 DIAGNOSIS — Z7982 Long term (current) use of aspirin: Secondary | ICD-10-CM

## 2020-11-13 HISTORY — DX: Disease of pericardium, unspecified: I31.9

## 2020-11-13 LAB — BASIC METABOLIC PANEL
Anion gap: 10 (ref 5–15)
BUN: 37 mg/dL — ABNORMAL HIGH (ref 8–23)
CO2: 27 mmol/L (ref 22–32)
Calcium: 9 mg/dL (ref 8.9–10.3)
Chloride: 101 mmol/L (ref 98–111)
Creatinine, Ser: 1.16 mg/dL — ABNORMAL HIGH (ref 0.44–1.00)
GFR, Estimated: 51 mL/min — ABNORMAL LOW (ref >60)
Glucose, Bld: 136 mg/dL — ABNORMAL HIGH (ref 70–99)
Potassium: 4.1 mmol/L (ref 3.5–5.1)
Sodium: 138 mmol/L (ref 135–145)

## 2020-11-13 LAB — BRAIN NATRIURETIC PEPTIDE: B Natriuretic Peptide: 2124 pg/mL — ABNORMAL HIGH (ref 0.0–100.0)

## 2020-11-13 LAB — TROPONIN I (HIGH SENSITIVITY)
Troponin I (High Sensitivity): 99 ng/L — ABNORMAL HIGH (ref <18)
Troponin I (High Sensitivity): 97 ng/L — ABNORMAL HIGH (ref <18)

## 2020-11-13 LAB — PROTIME-INR
INR: 1.1 (ref 0.8–1.2)
Prothrombin Time: 14.1 seconds (ref 11.4–15.2)

## 2020-11-13 LAB — CBC WITH DIFFERENTIAL/PLATELET
Abs Immature Granulocytes: 0.04 10*3/uL (ref 0.00–0.07)
Basophils Absolute: 0 10*3/uL (ref 0.0–0.1)
Basophils Relative: 0 %
Eosinophils Absolute: 0 10*3/uL (ref 0.0–0.5)
Eosinophils Relative: 0 %
HCT: 49.2 % — ABNORMAL HIGH (ref 36.0–46.0)
Hemoglobin: 16 g/dL — ABNORMAL HIGH (ref 12.0–15.0)
Immature Granulocytes: 0 %
Lymphocytes Relative: 6 %
Lymphs Abs: 0.7 10*3/uL (ref 0.7–4.0)
MCH: 31.1 pg (ref 26.0–34.0)
MCHC: 32.5 g/dL (ref 30.0–36.0)
MCV: 95.5 fL (ref 80.0–100.0)
Monocytes Absolute: 0.8 10*3/uL (ref 0.1–1.0)
Monocytes Relative: 7 %
Neutro Abs: 9 10*3/uL — ABNORMAL HIGH (ref 1.7–7.7)
Neutrophils Relative %: 87 %
Platelets: 197 10*3/uL (ref 150–400)
RBC: 5.15 MIL/uL — ABNORMAL HIGH (ref 3.87–5.11)
RDW: 15.5 % (ref 11.5–15.5)
WBC: 10.6 10*3/uL — ABNORMAL HIGH (ref 4.0–10.5)
nRBC: 0 % (ref 0.0–0.2)

## 2020-11-13 LAB — C-REACTIVE PROTEIN: CRP: 8.5 mg/dL — ABNORMAL HIGH (ref <1.0)

## 2020-11-13 LAB — SEDIMENTATION RATE: Sed Rate: 5 mm/hr (ref 0–22)

## 2020-11-13 MED ORDER — PANTOPRAZOLE SODIUM 40 MG PO TBEC
40.0000 mg | DELAYED_RELEASE_TABLET | Freq: Every day | ORAL | Status: DC
  Administered 2020-11-13 – 2020-11-15 (×3): 40 mg via ORAL
  Filled 2020-11-13 (×3): qty 1

## 2020-11-13 MED ORDER — IBUPROFEN 600 MG PO TABS
800.0000 mg | ORAL_TABLET | Freq: Three times a day (TID) | ORAL | Status: DC
  Administered 2020-11-13 – 2020-11-15 (×6): 800 mg via ORAL
  Filled 2020-11-13 (×6): qty 1

## 2020-11-13 MED ORDER — SPIRONOLACTONE 25 MG PO TABS
25.0000 mg | ORAL_TABLET | Freq: Every day | ORAL | Status: DC
  Administered 2020-11-15: 25 mg via ORAL
  Filled 2020-11-13 (×2): qty 1

## 2020-11-13 MED ORDER — PROSIGHT PO TABS
1.0000 | ORAL_TABLET | Freq: Every day | ORAL | Status: DC
  Administered 2020-11-14 – 2020-11-15 (×2): 1 via ORAL
  Filled 2020-11-13 (×2): qty 1

## 2020-11-13 MED ORDER — EMPAGLIFLOZIN 10 MG PO TABS
10.0000 mg | ORAL_TABLET | Freq: Every day | ORAL | Status: DC
  Administered 2020-11-14 – 2020-11-15 (×2): 10 mg via ORAL
  Filled 2020-11-13 (×2): qty 1

## 2020-11-13 MED ORDER — SACUBITRIL-VALSARTAN 24-26 MG PO TABS
1.0000 | ORAL_TABLET | Freq: Two times a day (BID) | ORAL | Status: DC
  Administered 2020-11-13 – 2020-11-15 (×3): 1 via ORAL
  Filled 2020-11-13 (×3): qty 1

## 2020-11-13 MED ORDER — ONDANSETRON HCL 4 MG/2ML IJ SOLN: 4.0000 mg | Freq: Four times a day (QID) | INTRAMUSCULAR | Status: DC | PRN

## 2020-11-13 MED ORDER — POTASSIUM CHLORIDE CRYS ER 20 MEQ PO TBCR
20.0000 meq | EXTENDED_RELEASE_TABLET | Freq: Every day | ORAL | Status: DC
  Filled 2020-11-13: qty 1

## 2020-11-13 MED ORDER — ENOXAPARIN SODIUM 40 MG/0.4ML IJ SOSY
40.0000 mg | PREFILLED_SYRINGE | INTRAMUSCULAR | Status: DC
  Administered 2020-11-13 – 2020-11-14 (×2): 40 mg via SUBCUTANEOUS
  Filled 2020-11-13 (×2): qty 0.4

## 2020-11-13 MED ORDER — SODIUM CHLORIDE 0.9% FLUSH
3.0000 mL | Freq: Two times a day (BID) | INTRAVENOUS | Status: DC
  Administered 2020-11-13 – 2020-11-15 (×4): 3 mL via INTRAVENOUS

## 2020-11-13 MED ORDER — SODIUM CHLORIDE 0.9% FLUSH: 3.0000 mL | INTRAVENOUS | Status: DC | PRN

## 2020-11-13 MED ORDER — METOPROLOL SUCCINATE ER 25 MG PO TB24
25.0000 mg | ORAL_TABLET | Freq: Every day | ORAL | Status: DC
  Administered 2020-11-15: 25 mg via ORAL
  Filled 2020-11-13: qty 1

## 2020-11-13 MED ORDER — AFLIBERCEPT 2 MG/0.05ML IZ SOLN: 2.0000 mg | INTRAVITREAL | Status: DC

## 2020-11-13 MED ORDER — POLYVINYL ALCOHOL 1.4 % OP SOLN
1.0000 [drp] | Freq: Three times a day (TID) | OPHTHALMIC | Status: DC | PRN
  Filled 2020-11-13: qty 15

## 2020-11-13 MED ORDER — ACETAMINOPHEN 325 MG PO TABS: 650.0000 mg | ORAL_TABLET | ORAL | Status: DC | PRN

## 2020-11-13 MED ORDER — SIMVASTATIN 20 MG PO TABS
20.0000 mg | ORAL_TABLET | Freq: Every evening | ORAL | Status: DC
  Administered 2020-11-13 – 2020-11-14 (×2): 20 mg via ORAL
  Filled 2020-11-13 (×2): qty 1

## 2020-11-13 MED ORDER — SODIUM CHLORIDE 0.9 % IV SOLN: 250.0000 mL | INTRAVENOUS | Status: DC | PRN

## 2020-11-13 MED ORDER — NITROGLYCERIN 0.4 MG SL SUBL: 0.4000 mg | SUBLINGUAL_TABLET | SUBLINGUAL | Status: DC | PRN

## 2020-11-13 MED ORDER — METFORMIN HCL ER 500 MG PO TB24
500.0000 mg | ORAL_TABLET | Freq: Every evening | ORAL | Status: DC
  Administered 2020-11-14: 500 mg via ORAL
  Filled 2020-11-13 (×2): qty 1

## 2020-11-13 MED ORDER — COLCHICINE 0.6 MG PO TABS
0.6000 mg | ORAL_TABLET | Freq: Every day | ORAL | Status: DC
  Administered 2020-11-15: 0.6 mg via ORAL
  Filled 2020-11-13: qty 1

## 2020-11-13 MED ORDER — COLCHICINE 0.6 MG PO TABS
0.6000 mg | ORAL_TABLET | Freq: Two times a day (BID) | ORAL | Status: AC
  Administered 2020-11-13 – 2020-11-14 (×2): 0.6 mg via ORAL
  Filled 2020-11-13 (×2): qty 1

## 2020-11-13 NOTE — ED Notes (Signed)
Provided pt bag lunch and beverage 

## 2020-11-13 NOTE — ED Triage Notes (Signed)
Pt from home with ems c.o chest pain and sob since 11am this morning. Pt took 5 nitros at home prior to ems arrival without relief of pain. Ems gave 324 ASA in route. Pt recently admitted for CHF 2 weeks ago. PT a.o, resp labored at this time

## 2020-11-13 NOTE — ED Notes (Signed)
While pt was eat O2 dropped to 88%, placed 2L Oto. Pt at 94% O2

## 2020-11-13 NOTE — ED Provider Notes (Signed)
[facility] EMERGENCY DEPARTMENT Provider Note   CSN: [id number] Arrival date & time: 11/13/20  1447     History CC: Chest pressure   [name] is a 70 y.o. female w/ hx of CHF (EF <20% on 10/26/20 echo), presenting to the emergency department with chest pain.  Patient reports he has had chest pain for "maybe a few days".  She describes substernal pressure sensation in her chest.  It comes and goes.  It is not associated with activity.  She reports she has not had it before, nothing makes it better or worse.  She denies any new leg swelling or dyspnea.  She took 5 sublingual nitroglycerin tablets at home this morning and reports no change in her chest pressure pain with these medications.  She was given full dose aspirin by EMS.  She typically takes baby aspirin at night.  She had a right heart cath performed on July 13, approximately 2 weeks ago, showing mildly elevated biventricular filling pressures, mild mixed pulmonary hypertension, normal cardiac output.  She was discharged from the hospital on July 15 after admission for shortness of breath and congestive heart failure, was diuresed approximately 3 L in the hospital, and also had drainage of 2 L of fluid performed during right-sided thoracentesis on 10/26/20.  Also noted to have hypokalemia, as well as an acute kidney injury at that time.  She takes metoprolol and torsemide at home.  She reports she takes 30 mg daily of torsemide.  She is urinating with this.  HPI     Past Medical History:  Diagnosis Date   Abnormal nuclear stress test 03/29/2020   Chronic combined systolic and diastolic CHF (congestive heart failure) (HCC)    CKD (chronic kidney disease), stage II    Depression    Diabetes mellitus, type 2 (HCC)    DVT (deep venous thrombosis) (HCC) 07/2011   S/P coumadin x 6 months   Dyspnea on exertion 03/19/2020   HTN (hypertension)    Hyperlipidemia    Hypertension    Hypokalemia 05/08/2012    Iron deficiency anemia    Leukocytosis 05/08/2012   Mitral regurgitation    Nonischemic cardiomyopathy (HCC) 04/21/2020   Pericardial effusion    small by echo 07/2020   Pulmonary hypertension (HCC)    Tobacco abuse 05/08/2012   Tricuspid regurgitation     Patient Active Problem List   Diagnosis Date Noted   Pericarditis 11/13/2020   Pressure injury of skin 10/27/2020   Pulmonary hypertension, unspecified (HCC) 10/27/2020   Pleural effusion 10/26/2020   Shortness of breath 10/26/2020   Pleural effusion on right    Acute on chronic combined systolic and diastolic CHF (congestive heart failure) (HCC)    Wheezing    CAD (coronary artery disease) 04/21/2020   Nonischemic cardiomyopathy (HCC) 04/21/2020   Abnormal nuclear stress test 03/29/2020   Atypical Angina pectoris (HCC) 03/19/2020   Dyspnea on exertion 03/19/2020   Diabetes mellitus due to underlying condition with unspecified complications (HCC) 03/09/2020   Acute combined systolic and diastolic heart failure (HCC) 03/09/2020   Diabetes mellitus, type 2 (HCC)    Hypertension    HTN (hypertension)    Iron deficiency anemia    Iron deficiency anemia, unspecified, symptomatic with dyspnea, dizziness, and shortness of breath with activity 05/08/2012   Hypotension 05/08/2012   Hypokalemia 05/08/2012   Hyperlipidemia 05/08/2012   Depression 05/08/2012   Leukocytosis 05/08/2012   DVT (deep venous thrombosis) (HCC) 07/2011    Past Surgical  History:  Procedure Laterality Date   APPENDECTOMY     Bilateral oophrectomy     Secondary to benign cysts   colonoscopy     with polypectomy   LEFT HEART CATH AND CORONARY ANGIOGRAPHY N/A 03/29/2020   Procedure: LEFT HEART CATH AND CORONARY ANGIOGRAPHY;  Surgeon: Marykay Lex, MD;  Location: [facility] INVASIVE CV LAB;  Service: Cardiovascular;  Laterality: N/A;   RIGHT HEART CATH N/A 10/27/2020   Procedure: RIGHT HEART CATH;  Surgeon: Dolores Patty, MD;  Location: [facility] INVASIVE CV  LAB;  Service: Cardiovascular;  Laterality: N/A;   TONSILLECTOMY       OB History   No obstetric history on file.     Family History  Problem Relation Age of Onset   Uterine cancer Mother    Breast cancer Mother    COPD Mother    Alcoholism Father    Liver disease Father    Diabetes Maternal Grandmother    Stroke Maternal Grandfather    CAD Paternal Grandmother    Diabetes Paternal Grandmother    Stroke Paternal Grandfather     Social History   Tobacco Use   Smoking status: Every Day    Packs/day: 0.50    Years: 40.00    Pack years: 20.00    Types: Cigarettes   Smokeless tobacco: Never  Substance Use Topics   Alcohol use: Yes    Alcohol/week: 15.0 standard drinks    Types: 15 Cans of beer per week   Drug use: No    Home Medications Prior to Admission medications   Medication Sig Start Date End Date Taking? Authorizing Provider  Aflibercept (EYLEA) 2 MG/0.05ML SOLN 2 mg by Intravitreal route every 8 (eight) weeks. 10/29/20  Yes Lonia Blood, MD  aspirin 81 MG EC tablet Take 81 mg by mouth every evening.    Yes [provider]  carboxymethylcellulose (REFRESH PLUS) 0.5 % SOLN Place 1 drop into both eyes 3 (three) times daily as needed for dry eyes (dry eye).   Yes [provider]  empagliflozin (JARDIANCE) 10 MG TABS tablet Take 1 tablet (10 mg total) by mouth daily. 10/30/20  Yes Lonia Blood, MD  ferrous sulfate 325 (65 FE) MG tablet Take 325 mg by mouth 4 (four) times a week.   Yes [provider]  metFORMIN (GLUCOPHAGE-XR) 500 MG 24 hr tablet Take 500 mg by mouth every evening.  04/03/19  Yes [provider]  metoprolol succinate (TOPROL-XL) 25 MG 24 hr tablet Take 1 tablet (25 mg total) by mouth daily. Take with or immediately following a meal. 10/29/20  Yes Lonia Blood, MD  Multiple Vitamins-Minerals (PRESERVISION AREDS 2+MULTI VIT) CAPS Take 1 Dose by mouth daily.   Yes [provider]  nitroGLYCERIN  (NITROSTAT) 0.4 MG SL tablet Place 0.4 mg under the tongue every 5 (five) minutes as needed for chest pain.   Yes [provider]  potassium chloride SA (KLOR-CON) 20 MEQ tablet Take 1 tablet (20 mEq total) by mouth daily. 10/29/20  Yes Lonia Blood, MD  sacubitril-valsartan (ENTRESTO) 24-26 MG Take 1 tablet by mouth 2 (two) times daily.   Yes [provider]  simvastatin (ZOCOR) 20 MG tablet Take 20 mg by mouth every evening.    Yes [provider]  spironolactone (ALDACTONE) 25 MG tablet Take 1 tablet (25 mg total) by mouth daily. 10/08/20  Yes Camnitz, Will Daphine Deutscher, MD  torsemide (DEMADEX) 20 MG tablet Take 50 mg by mouth daily.  10/29/20  Yes [provider]    Allergies    Patient has no known allergies.  Review of Systems   Review of Systems  Constitutional:  Negative for chills and fever.  Eyes:  Negative for pain and visual disturbance.  Respiratory:  Negative for cough and shortness of breath.   Cardiovascular:  Positive for chest pain. Negative for palpitations and leg swelling.  Gastrointestinal:  Negative for abdominal pain and vomiting.  Genitourinary:  Negative for dysuria and hematuria.  Musculoskeletal:  Negative for arthralgias and back pain.  Skin:  Negative for color change and rash.  Neurological:  Negative for syncope and headaches.  All other systems reviewed and are negative.  Physical Exam Updated Vital Signs BP (!) 110/44   Pulse 75   Temp 98.6 F (37 C) (Oral)   Resp 19   Ht 5\' 4"  (1.626 m)   Wt 49.9 kg   SpO2 95%   BMI 18.88 kg/m   Physical Exam Constitutional:      General: She is not in acute distress. HENT:     Head: Normocephalic and atraumatic.  Eyes:     Conjunctiva/sclera: Conjunctivae normal.     Pupils: Pupils are equal, round, and reactive to light.  Cardiovascular:     Rate and Rhythm: Normal rate and regular rhythm.     Pulses: Normal pulses.  Pulmonary:     Effort: Pulmonary effort is normal.  No respiratory distress.     Breath sounds: Normal breath sounds.  Abdominal:     General: There is no distension.     Tenderness: There is no abdominal tenderness.  Musculoskeletal:     Right lower leg: No edema.     Left lower leg: No edema.  Skin:    General: Skin is warm and dry.  Neurological:     General: No focal deficit present.     Mental Status: She is alert. Mental status is at baseline.    ED Results / Procedures / Treatments   Labs (all labs ordered are listed, but only abnormal results are displayed) Labs Reviewed  CBC WITH DIFFERENTIAL/PLATELET - Abnormal; Notable for the following components:      Result Value   WBC 10.6 (*)    RBC 5.15 (*)    Hemoglobin 16.0 (*)    HCT 49.2 (*)    Neutro Abs 9.0 (*)    All other components within normal limits  BRAIN NATRIURETIC PEPTIDE - Abnormal; Notable for the following components:   B Natriuretic Peptide 2,124.0 (*)    All other components within normal limits  BASIC METABOLIC PANEL - Abnormal; Notable for the following components:   Glucose, Bld 136 (*)    BUN 37 (*)    Creatinine, Ser 1.16 (*)    GFR, Estimated 51 (*)    All other components within normal limits  C-REACTIVE PROTEIN - Abnormal; Notable for the following components:   CRP 8.5 (*)    All other components within normal limits  BASIC METABOLIC PANEL - Abnormal; Notable for the following components:   Potassium 3.4 (*)    BUN 46 (*)    Creatinine, Ser 1.43 (*)    Calcium 8.7 (*)    GFR, Estimated 39 (*)    All other components within normal limits  TROPONIN I (HIGH SENSITIVITY) - Abnormal; Notable for the following components:   Troponin I (High Sensitivity) 99 (*)    All other components within normal limits  TROPONIN I (HIGH SENSITIVITY) -  Abnormal; Notable for the following components:   Troponin I (High Sensitivity) 97 (*)    All other components within normal limits  PROTIME-INR  SEDIMENTATION RATE  MAGNESIUM    EKG EKG  Interpretation  Date/Time:  Saturday November 13 2020 15:05:34 EDT Ventricular Rate:  74 PR Interval:  171 QRS Duration: 101 QT Interval:  401 QTC Calculation: 445 R Axis:   -45 Text Interpretation: Sinus rhythm LAD, consider left anterior fascicular block LVH with secondary repolarization abnormality Anterior Q waves, possibly due to LVH When compared with ECG of 10/26/2020, No significant change was found Confirmed by Dione Booze ([id number]) on 11/14/2020 3:55:18 AM  Radiology DG Chest 2 View  Result Date: 11/13/2020 CLINICAL DATA:  Chest pain and shortness of breath. EXAM: CHEST - 2 VIEW COMPARISON:  10/26/2020 FINDINGS: Improved inspiration with no gross change in enlargement of the cardiac silhouette. The aorta remains tortuous and calcified. A small right pleural effusion is again demonstrated without significant change. The pulmonary vasculature and interstitial markings remain prominent. Diffuse osteo. IMPRESSION: 1. Stable cardiomegaly, pulmonary vascular congestion and chronic interstitial lung disease with possible mild superimposed interstitial pulmonary edema. 2. Stable small right pleural effusion. Electronically Signed   By: Beckie Salts M.D.   On: 11/13/2020 16:08    Procedures Procedures   Medications Ordered in ED Medications  simvastatin (ZOCOR) tablet 20 mg (20 mg Oral Given 11/13/20 2205)  metFORMIN (GLUCOPHAGE-XR) 24 hr tablet 500 mg (has no administration in time range)  polyvinyl alcohol (LIQUIFILM TEARS) 1.4 % ophthalmic solution 1 drop (has no administration in time range)  nitroGLYCERIN (NITROSTAT) SL tablet 0.4 mg (has no administration in time range)  sacubitril-valsartan (ENTRESTO) 24-26 mg per tablet (1 tablet Oral Given 11/13/20 2204)  spironolactone (ALDACTONE) tablet 25 mg (has no administration in time range)  multivitamin (PROSIGHT) tablet 1 tablet (has no administration in time range)  metoprolol succinate (TOPROL-XL) 24 hr tablet 25 mg (has no administration in  time range)  empagliflozin (JARDIANCE) tablet 10 mg (has no administration in time range)  potassium chloride SA (KLOR-CON) CR tablet 20 mEq (has no administration in time range)  sodium chloride flush (NS) 0.9 % injection 3 mL (3 mLs Intravenous Given 11/13/20 2211)  sodium chloride flush (NS) 0.9 % injection 3 mL (has no administration in time range)  0.9 %  sodium chloride infusion (has no administration in time range)  acetaminophen (TYLENOL) tablet 650 mg (has no administration in time range)  ondansetron (ZOFRAN) injection 4 mg (has no administration in time range)  enoxaparin (LOVENOX) injection 40 mg (40 mg Subcutaneous Given 11/13/20 2207)  ibuprofen (ADVIL) tablet 800 mg (800 mg Oral Given 11/13/20 2206)  colchicine tablet 0.6 mg (0.6 mg Oral Given 11/13/20 2202)    Followed by  colchicine tablet 0.6 mg (has no administration in time range)  pantoprazole (PROTONIX) EC tablet 40 mg (40 mg Oral Given 11/13/20 2205)  potassium chloride SA (KLOR-CON) CR tablet 40 mEq (40 mEq Oral Given 11/14/20 0801)    ED Course  I have reviewed the triage vital signs and the nursing notes.  Pertinent labs & imaging results that were available during my care of the patient were reviewed by me and considered in my medical decision making (see chart for details).  This patient complains of chest pain.  This involves an extensive number of treatment options, and is a complaint that carries with it a high risk of complications and morbidity.  The differential diagnosis includes stable angina vs PNA  vs gastritis vs other  Most likely suspect that this is pain related to her significant nonischemic cardiomyopathy with end-stage heart disease.  She does not present in any sign of significant respiratory distress to suggest substantial pleural effusion or volume overload.  X-ray does show pulmonary edema and small pleural effusion.  She is stable on room air at this time.  I will defer decision for further  diuresis to cardiology service.  Troponins reviewed and elevated but near baseline.  Likewise for BNP.  I will consult with cardiology regarding further steps.  The patient is already taking multiple doses of nitroglycerin at home with no relief.  It is unclear to me what further can be offered at this time.  I personally reviewed her prior medical visit, hospitalization stay, right heart cath and recent echocardiogram.   Clinical Course as of 11/14/20 0811  Sat Nov 13, 2020  1744 Troponin and BNP both elevated but near baseline levels. [MT]  1925 IMPRESSION: 1. Stable cardiomegaly, pulmonary vascular congestion and chronic interstitial lung disease with possible mild superimposed interstitial pulmonary edema. 2. Stable small right pleural effusion.   [MT]  2003 Cardiology consulted Dr Cherly Beach to evaluate patient. [MT]  2130 Cardiology to admit [MT]    Clinical Course User Index [MT] Deajah Erkkila, Kermit Balo, MD    Final Clinical Impression(s) / ED Diagnoses Final diagnoses:  Chest pain, unspecified type    Rx / DC Orders ED Discharge Orders     None        Terald Sleeper, MD 11/14/20 [phone]

## 2020-11-13 NOTE — ED Notes (Signed)
2LNC applied for comfort ?

## 2020-11-13 NOTE — ED Notes (Signed)
Assisted pt on BSC 

## 2020-11-13 NOTE — ED Notes (Signed)
Patient transported to X-ray 

## 2020-11-13 NOTE — ED Notes (Signed)
Provided pt w/ bag lunch.

## 2020-11-13 NOTE — ED Notes (Signed)
Assisted pt to BSC.

## 2020-11-14 ENCOUNTER — Observation Stay (HOSPITAL_COMMUNITY): Payer: Medicare Other

## 2020-11-14 ENCOUNTER — Encounter (HOSPITAL_COMMUNITY): Payer: Self-pay | Admitting: Cardiovascular Disease

## 2020-11-14 DIAGNOSIS — I318 Other specified diseases of pericardium: Secondary | ICD-10-CM | POA: Diagnosis not present

## 2020-11-14 DIAGNOSIS — N183 Chronic kidney disease, stage 3 unspecified: Secondary | ICD-10-CM | POA: Diagnosis present

## 2020-11-14 DIAGNOSIS — Z8049 Family history of malignant neoplasm of other genital organs: Secondary | ICD-10-CM | POA: Diagnosis not present

## 2020-11-14 DIAGNOSIS — E785 Hyperlipidemia, unspecified: Secondary | ICD-10-CM | POA: Diagnosis present

## 2020-11-14 DIAGNOSIS — I2722 Pulmonary hypertension due to left heart disease: Secondary | ICD-10-CM | POA: Diagnosis present

## 2020-11-14 DIAGNOSIS — N179 Acute kidney failure, unspecified: Secondary | ICD-10-CM | POA: Diagnosis present

## 2020-11-14 DIAGNOSIS — Z833 Family history of diabetes mellitus: Secondary | ICD-10-CM | POA: Diagnosis not present

## 2020-11-14 DIAGNOSIS — I300 Acute nonspecific idiopathic pericarditis: Secondary | ICD-10-CM | POA: Diagnosis not present

## 2020-11-14 DIAGNOSIS — Z823 Family history of stroke: Secondary | ICD-10-CM | POA: Diagnosis not present

## 2020-11-14 DIAGNOSIS — Z7982 Long term (current) use of aspirin: Secondary | ICD-10-CM | POA: Diagnosis not present

## 2020-11-14 DIAGNOSIS — I130 Hypertensive heart and chronic kidney disease with heart failure and stage 1 through stage 4 chronic kidney disease, or unspecified chronic kidney disease: Secondary | ICD-10-CM | POA: Diagnosis present

## 2020-11-14 DIAGNOSIS — Z79899 Other long term (current) drug therapy: Secondary | ICD-10-CM | POA: Diagnosis not present

## 2020-11-14 DIAGNOSIS — F17210 Nicotine dependence, cigarettes, uncomplicated: Secondary | ICD-10-CM | POA: Diagnosis present

## 2020-11-14 DIAGNOSIS — D509 Iron deficiency anemia, unspecified: Secondary | ICD-10-CM | POA: Diagnosis present

## 2020-11-14 DIAGNOSIS — R64 Cachexia: Secondary | ICD-10-CM | POA: Diagnosis present

## 2020-11-14 DIAGNOSIS — I5043 Acute on chronic combined systolic (congestive) and diastolic (congestive) heart failure: Secondary | ICD-10-CM | POA: Diagnosis present

## 2020-11-14 DIAGNOSIS — Z803 Family history of malignant neoplasm of breast: Secondary | ICD-10-CM | POA: Diagnosis not present

## 2020-11-14 DIAGNOSIS — Z7984 Long term (current) use of oral hypoglycemic drugs: Secondary | ICD-10-CM | POA: Diagnosis not present

## 2020-11-14 DIAGNOSIS — Z681 Body mass index (BMI) 19 or less, adult: Secondary | ICD-10-CM | POA: Diagnosis not present

## 2020-11-14 DIAGNOSIS — Z8249 Family history of ischemic heart disease and other diseases of the circulatory system: Secondary | ICD-10-CM | POA: Diagnosis not present

## 2020-11-14 DIAGNOSIS — E1122 Type 2 diabetes mellitus with diabetic chronic kidney disease: Secondary | ICD-10-CM | POA: Diagnosis present

## 2020-11-14 DIAGNOSIS — Z86718 Personal history of other venous thrombosis and embolism: Secondary | ICD-10-CM | POA: Diagnosis not present

## 2020-11-14 DIAGNOSIS — I319 Disease of pericardium, unspecified: Secondary | ICD-10-CM | POA: Diagnosis present

## 2020-11-14 DIAGNOSIS — Z825 Family history of asthma and other chronic lower respiratory diseases: Secondary | ICD-10-CM | POA: Diagnosis not present

## 2020-11-14 DIAGNOSIS — I428 Other cardiomyopathies: Secondary | ICD-10-CM | POA: Diagnosis present

## 2020-11-14 DIAGNOSIS — I248 Other forms of acute ischemic heart disease: Secondary | ICD-10-CM | POA: Diagnosis present

## 2020-11-14 LAB — BASIC METABOLIC PANEL
Anion gap: 9 (ref 5–15)
BUN: 46 mg/dL — ABNORMAL HIGH (ref 8–23)
CO2: 28 mmol/L (ref 22–32)
Calcium: 8.7 mg/dL — ABNORMAL LOW (ref 8.9–10.3)
Chloride: 103 mmol/L (ref 98–111)
Creatinine, Ser: 1.43 mg/dL — ABNORMAL HIGH (ref 0.44–1.00)
GFR, Estimated: 39 mL/min — ABNORMAL LOW (ref >60)
Glucose, Bld: 78 mg/dL (ref 70–99)
Potassium: 3.4 mmol/L — ABNORMAL LOW (ref 3.5–5.1)
Sodium: 140 mmol/L (ref 135–145)

## 2020-11-14 LAB — GLUCOSE, CAPILLARY: Glucose-Capillary: 122 mg/dL — ABNORMAL HIGH (ref 70–99)

## 2020-11-14 LAB — ECHOCARDIOGRAM COMPLETE
Area-P 1/2: 3.46 cm2
Height: 64 in
S' Lateral: 3.9 cm
Weight: 1760 oz

## 2020-11-14 LAB — MAGNESIUM: Magnesium: 2.4 mg/dL (ref 1.7–2.4)

## 2020-11-14 MED ORDER — ENSURE ENLIVE PO LIQD
237.0000 mL | Freq: Two times a day (BID) | ORAL | Status: DC
  Administered 2020-11-15: 237 mL via ORAL

## 2020-11-14 MED ORDER — POTASSIUM CHLORIDE CRYS ER 20 MEQ PO TBCR
40.0000 meq | EXTENDED_RELEASE_TABLET | Freq: Once | ORAL | Status: AC
  Administered 2020-11-14: 40 meq via ORAL
  Filled 2020-11-14: qty 2

## 2020-11-14 NOTE — Progress Notes (Signed)
  Echocardiogram 2D Echocardiogram has been performed.  [name] 11/14/2020, 9:36 AM

## 2020-11-14 NOTE — H&P (Addendum)
Cardiology Admission History and Physical:   Patient ID: [name] MRN: [id number]; DOB: Aug 23, 1950   Admission date: 11/13/2020  Primary Care Provider: Enid Skeens., MD [facility] Cardiologist: Jenean Lindau, MD  [facility] Electrophysiologist:  Will Meredith Leeds, MD   Chief Complaint: chest pain   Patient Profile:   [name] is a 70 y.o. female with HFrEF, NICM, moderate PH, DM2, remote DVT (2013), HTN, CKD, former tobacco use, and Fe def anemia who presents with acute onset chest pain.   History of Present Illness:   [name] was discharged 2 weeks ago following a 4-day stay for HF exacerbation.  Her EF has been severely reduced at 25% since 03/2020.  She is on optimal GDMT with quadruple therapy of Entresto/Jardiance/spironolactone/Toprol along with torsemide.  She was found to have a significant right-sided pleural effusion for which she underwent thoracentesis with 1.9 L fluid removal which significantly improved her shortness of breath.  Her BNP during that admission was elevated over assay (>4500) with mildly elevated but flat hsT (90-92).  She underwent RHC which only showed mildly elevated filling pressures (RA 11, RV 56/12, PA 50/21, m 34, PCWP 20, CO 4.8, CI 2.7) and preserved CO/CI despite her severely reduced EF.  She was diuresed but her BUN started to increase so she was transitioned to p.o. diuretics and discharged home.   She was doing fairly well following hospital discharge and continued to lose a significant amount of weight while on torsemide 60 mg p.o. daily.  She noticed a dramatic improvement in her ability to complete daily ADLs and get around the house.  She also noticed that her PND, orthopnea and lower extremity edema has significantly improved.  Unfortunately she woke up the morning of 7/30 and felt central chest discomfort of 7-11/2008 severity that was crushing with no radiation.  She took 5 sublingual nitroglycerin without any  effect and had associated nausea during the same time.  She has never had pain in the same area before and is never been this severe.  This pain persisted for several hours and did not change with exertional activity before it gradually went away when she was on her way to the hospital.  She did have nausea but denied any emesis.  She denies any recent sick contacts and did not have any fever/chills.  She has received her COVID vaccinations with her booster in December.  She cannot recall whether her chest discomfort was positional in nature.  Her urine output was the same as it had been in the past several weeks without any decline.  She is recommended to decrease her torsemide to 30 mg p.o. daily however has been slowly decreasing it over the past few days and is still taking torsemide 50 mg p.o. daily currently.  She currently sleeps on 1 pillow denies any PND orthopnea but it does take her several minutes to go up the stairs with mild shortness of breath/DOE.  Her O2 sats at home are always in the high 80s to low 90s on room air.   VS during my evaluation were at her baseline (HR 77, BP 91/51, MAP 63, SpO2 89-94% on RA).   On review of her weight logs she has lost over 55 pounds in the past 30 days (168-169 on 07/01, 110-121 lb over the past week).  Her weight loss trends seem fairly reliable as she almost lost 1 to 2 pounds each day on diuretics.  Past Medical History:  Diagnosis  Date   Abnormal nuclear stress test 03/29/2020   Chronic combined systolic and diastolic CHF (congestive heart failure) ([place])    CKD (chronic kidney disease), stage II    Depression    Diabetes mellitus, type 2 (HCC)    DVT (deep venous thrombosis) ([place]) 07/2011   S/P coumadin x 6 months   Dyspnea on exertion 03/19/2020   HTN (hypertension)    Hyperlipidemia    Hypertension    Hypokalemia 05/08/2012   Iron deficiency anemia    Leukocytosis 05/08/2012   Mitral regurgitation    Nonischemic cardiomyopathy ([place])  04/21/2020   Pericardial effusion    small by echo 07/2020   Pulmonary hypertension ([place])    Tobacco abuse 05/08/2012   Tricuspid regurgitation    Past Surgical History:  Procedure Laterality Date   APPENDECTOMY     Bilateral oophrectomy     Secondary to benign cysts   colonoscopy     with polypectomy   LEFT HEART CATH AND CORONARY ANGIOGRAPHY N/A 03/29/2020   Procedure: LEFT HEART CATH AND CORONARY ANGIOGRAPHY;  Surgeon: Leonie Man, MD;  Location: [place] [facility];  Service: Cardiovascular;  Laterality: N/A;   RIGHT HEART CATH N/A 10/27/2020   Procedure: RIGHT HEART CATH;  Surgeon: Jolaine Artist, MD;  Location: [place] [facility];  Service: Cardiovascular;  Laterality: N/A;   TONSILLECTOMY      Medications Prior to Admission: Prior to Admission medications   Medication Sig Start Date End Date Taking? Authorizing Provider  Aflibercept (EYLEA) 2 MG/0.05ML SOLN 2 mg by Intravitreal route every 8 (eight) weeks. 10/29/20  Yes Cherene Altes, MD  aspirin 81 MG EC tablet Take 81 mg by mouth every evening.    Yes [provider]  carboxymethylcellulose (REFRESH PLUS) 0.5 % SOLN Place 1 drop into both eyes 3 (three) times daily as needed for dry eyes (dry eye).   Yes [provider]  empagliflozin (JARDIANCE) 10 MG TABS tablet Take 1 tablet (10 mg total) by mouth daily. 10/30/20  Yes Cherene Altes, MD  ferrous sulfate 325 (65 FE) MG tablet Take 325 mg by mouth 4 (four) times a week.   Yes [provider]  metFORMIN (GLUCOPHAGE-XR) 500 MG 24 hr tablet Take 500 mg by mouth every evening.  04/03/19  Yes [provider]  metoprolol succinate (TOPROL-XL) 25 MG 24 hr tablet Take 1 tablet (25 mg total) by mouth daily. Take with or immediately following a meal. 10/29/20  Yes Cherene Altes, MD  Multiple Vitamins-Minerals (PRESERVISION AREDS 2+MULTI VIT) CAPS Take 1 Dose by mouth daily.   Yes [provider]  nitroGLYCERIN (NITROSTAT)  0.4 MG SL tablet Place 0.4 mg under the tongue every 5 (five) minutes as needed for chest pain.   Yes [provider]  potassium chloride SA (KLOR-CON) 20 MEQ tablet Take 1 tablet (20 mEq total) by mouth daily. 10/29/20  Yes Cherene Altes, MD  sacubitril-valsartan (ENTRESTO) 24-26 MG Take 1 tablet by mouth 2 (two) times daily.   Yes [provider]  simvastatin (ZOCOR) 20 MG tablet Take 20 mg by mouth every evening.    Yes [provider]  spironolactone (ALDACTONE) 25 MG tablet Take 1 tablet (25 mg total) by mouth daily. 10/08/20  Yes Camnitz, Will Hassell Done, MD  torsemide (DEMADEX) 20 MG tablet Take 50 mg by mouth daily. 10/29/20  Yes [provider]    Allergies:   No Known Allergies  Social History:   Social  History   Socioeconomic History   Marital status: Married    Spouse name: Miki Labuda   Number of children: 2   Years of education: Not on file   Highest education level: Not on file  Occupational History   Occupation: Banker for a company    Employer: [employer]  Tobacco Use   Smoking status: Every Day    Packs/day: 0.50    Years: 40.00    Pack years: 20.00    Types: Cigarettes   Smokeless tobacco: Never  Substance and Sexual Activity   Alcohol use: Yes    Alcohol/week: 15.0 standard drinks    Types: 15 Cans of beer per week   Drug use: No   Sexual activity: Never  Other Topics Concern   Not on file  Social History Narrative   Married.  Lives in husband here in [place].  Normally independent of ADLs.   Social Determinants of Health   Financial Resource Strain: Not on file  Food Insecurity: Not on file  Transportation Needs: Not on file  Physical Activity: Not on file  Stress: Not on file  Social Connections: Not on file  Intimate Partner Violence: Not on file    Family History:   The patient's family history includes Alcoholism in her father; Breast cancer in her mother; CAD in her paternal grandmother; COPD in her  mother; Diabetes in her maternal grandmother and paternal grandmother; Liver disease in her father; Stroke in her maternal grandfather and paternal grandfather; Uterine cancer in her mother.    ROS:   Review of Systems: [y] = yes, [ ]  = no   General: Weight gain [ ] ; Weight loss [ ] ; Anorexia [ ] ; Fatigue [ ] ; Fever [ ] ; Chills [ ] ; Weakness [ ]    Cardiac: Chest pain/pressure [y]; Resting SOB [y]; Exertional SOB [y]; Orthopnea [ ] ; Pedal Edema [ ] ; Palpitations [ ] ; Syncope [ ] ; Presyncope [ ] ; Paroxysmal nocturnal dyspnea [ ]    Pulmonary: Cough [ ] ; Wheezing [ ] ; Hemoptysis [ ] ; Sputum [ ] ; Snoring [ ]    GI: Vomiting [ ] ; Dysphagia [ ] ; Melena [ ] ; Hematochezia [ ] ; Heartburn [ ] ; Abdominal pain [ ] ; Constipation [ ] ; Diarrhea [ ] ; BRBPR [ ]    GU: Hematuria [ ] ; Dysuria [ ] ; Nocturia [ ]  Vascular: Pain in legs with walking [ ] ; Pain in feet with lying flat [ ] ; Non-healing sores [ ] ; Stroke [ ] ; TIA [ ] ; Slurred speech [ ] ;   Neuro: Headaches [ ] ; Vertigo [ ] ; Seizures [ ] ; Paresthesias [ ] ;Blurred vision [ ] ; Diplopia [ ] ; Vision changes [ ]    Ortho/Skin: Arthritis [ ] ; Joint pain [ ] ; Muscle pain [ ] ; Joint swelling [ ] ; Back Pain [ ] ; Rash [ ]    Psych: Depression [ ] ; Anxiety [ ]    Heme: Bleeding problems [ ] ; Clotting disorders [ ] ; Anemia [ ]    Endocrine: Diabetes [ ] ; Thyroid dysfunction [ ]     Physical Exam/Data:   Vitals:   11/14/20 0715 11/14/20 0735 11/14/20 0745 11/14/20 0800  BP: (!) 90/55 (!) 86/51 (!) 75/46 (!) 110/44  Pulse: 70 70 70 75  Resp: 18 15 17 19   Temp:      TempSrc:      SpO2: 94% 93% 91% 95%  Weight:      Height:       No intake or output data in the 24 hours ending 11/14/20 0818 Last 3 Weights 11/13/2020 11/05/2020  10/29/2020  Weight (lbs) 110 lb 122 lb 142 lb  Weight (kg) 49.896 kg 55.339 kg 64.411 kg     Body mass index is 18.88 kg/m.  General:  Well nourished, well developed, in no acute distress HEENT: normal Lymph: no adenopathy Neck: no  JVD Endocrine:  No thryomegaly Vascular: No carotid bruits; FA pulses 2+ bilaterally without bruits  Cardiac:  normal S1, S2; RRR; no murmur  Lungs:  clear to auscultation bilaterally, no wheezing, rhonchi or rales  Abd: soft, nontender, no hepatomegaly  Ext: no LE edema Musculoskeletal:  No deformities, BUE and BLE strength normal and equal Skin: warm and dry  Neuro:  CNs 2-12 intact, no focal abnormalities noted Psych:  Normal affect   EKG:  The EKG was personally reviewed and demonstrates: NSR (HR 74), LAD, LAFB, LVH, anterior Q waves, compared to prior and only change is LAD, otherwise no signs of ischemia Telemetry:  Telemetry was personally reviewed and demonstrates: NSR  Relevant CV Studies: RHC Result date: 10/27/20 RA = 11 RV = 56/12 PA = 50/21 (34) PCW = 20 Fick cardiac output/index = 4.8/2.7 PVR = 2.9 WU Ao sat = 99% PA sat = 71%, 72% SVC sat = 68% PaPi = 2.6   TTE Result date: 10/26/20  1. Left ventricular ejection fraction, by estimation, is <20%. The left  ventricle has severely decreased function. The left ventricle demonstrates  global hypokinesis. Left ventricular diastolic parameters are consistent  with Grade II diastolic  dysfunction (pseudonormalization). Elevated left ventricular end-diastolic  pressure.   2. Right ventricular systolic function is moderately reduced. The right  ventricular size is normal. There is mildly elevated pulmonary artery  systolic pressure. The estimated right ventricular systolic pressure is  03.5 mmHg.   3. Left atrial size was severely dilated.   4. Right atrial size was severely dilated.   5. The mitral valve is normal in structure. Trivial mitral valve  regurgitation. No evidence of mitral stenosis.   6. Tricuspid valve regurgitation is mild to moderate.   7. The aortic valve is tricuspid. Aortic valve regurgitation is trivial.  Mild aortic valve sclerosis is present, with no evidence of aortic valve  stenosis.   8.  The inferior vena cava is normal in size with <50% respiratory  variability, suggesting right atrial pressure of 8 mmHg.   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 There is severe left ventricular systolic dysfunction. The left ventricular ejection fraction is less than 25% by visual estimate. LV end diastolic pressure is moderately elevated - 22 mmHg There is no aortic valve stenosis.  Laboratory Data:  High Sensitivity Troponin:   Recent Labs  Lab 10/26/20 0335 10/26/20 0521 11/13/20 1515 11/13/20 1703  TROPONINIHS 90* 92* 99* 97*      Chemistry Recent Labs  Lab 11/13/20 2013 11/14/20 0441  NA 138 140  K 4.1 3.4*  CL 101 103  CO2 27 28  GLUCOSE 136* 78  BUN 37* 46*  CREATININE 1.16* 1.43*  CALCIUM 9.0 8.7*  GFRNONAA 51* 39*  ANIONGAP 10 9    No results for input(s): PROT, ALBUMIN, AST, ALT, ALKPHOS, BILITOT in the last 168 hours. Hematology Recent Labs  Lab 11/13/20 1515  WBC 10.6*  RBC 5.15*  HGB 16.0*  HCT 49.2*  MCV 95.5  MCH 31.1  MCHC 32.5  RDW 15.5  PLT 197   BNP Recent Labs  Lab 11/13/20 1515  BNP 2,124.0*    DDimer No results for input(s): DDIMER in the last 168 hours.  Radiology/Studies:  DG Chest 2 View  Result Date: 11/13/2020 CLINICAL DATA:  Chest pain and shortness of breath. EXAM: CHEST - 2 VIEW COMPARISON:  10/26/2020 FINDINGS: Improved inspiration with no gross change in enlargement of the cardiac silhouette. The aorta remains tortuous and calcified. A small right pleural effusion is again demonstrated without significant change. The pulmonary vasculature and interstitial markings remain prominent. Diffuse osteo. IMPRESSION: 1. Stable cardiomegaly, pulmonary vascular congestion and chronic interstitial lung disease with possible mild superimposed interstitial pulmonary edema. 2.  Stable small right pleural effusion. Electronically Signed   By: Claudie Revering M.D.   On: 11/13/2020 16:08    HEART score (5), 12-16.6% risk of MACE  Assessment and Plan:   Possible cardiac chest pain Pericarditis Myocardial injury Mrs. Dedman's story is somewhat concerning for cardiac chest pain however she had a recent coronary angiogram in 03/2020 with no obstructive epicardial coronary disease.  Along with this her troponins while elevated are very similar to what they were during her recent hospitalization several weeks ago and did not have a significant delta.  She had a mild AKI which I presume is secondary to overdiuresis following over 50 pounds of weight loss in the past 30 days.  Her troponins, wall elevated, or at a similar level to what they were several weeks ago during her hospitalization and he did have a significant delta.  Her BNP is persistently elevated (2124) however is improved from over assay (>4500) on 07/12.  Her exam is not consistent with significant volume overload right now although she definitely needs maintenance diuretics ongoing.  We will hold diuresis today and see if her kidney function improves as her BUN is increasing along with her creatinine resulting in what is probably a contraction alkalosis.  Although her symptoms are not completely's consistent with pericarditis she had no relief with nitroglycerin and symptoms were persistent for several hours.  I would expect a larger troponin elevation if she had a plaque rupture. - ibuprofen 800 mg PO tid - colchicine 0.6 mg PO bid x1 day then 0.6 mg PO daily (wt <70 kg) - TTE today to check for effusion   4. HFrEF 5. NICM [name] appears to be better compensated during this admission with regards to her heart failure.  We will hold her diuresis today and likely resume tomorrow at a lower dose and continue the rest of her GDMT. - hold torsemide 50 mg PO daily today, transition to torsemide 20 mg PO daily tomorrow if  sCr back to bl - continue jardiance 10 mg PO daily - continue toprol XL 25 mg PO daily - continue entresto 24/26 mg PO bid - continue spiro 25 mg PO daily   6. HLD - continue simvastatin 20 mg PO qhs    7. DM2 - continue jardiance - continue metformin - A1c (6.1) on 07/12  Severity of Illness: The appropriate patient status for this patient is INPATIENT. Inpatient status is judged to be reasonable and necessary in order to provide the required intensity of service to ensure the patient's safety. The patient's presenting symptoms, physical exam findings, and initial radiographic and laboratory data in the context of their chronic comorbidities is felt to place them at high risk for further clinical deterioration. Furthermore, it is not anticipated that the patient will be medically stable for discharge from the hospital within 2 midnights of admission.  The following factors support the patient status of inpatient.   " The patient's presenting symptoms include chest pain  " The worrisome physical exam findings include elevated hsT and CRP " The initial radiographic and laboratory data are worrisome because of n/a " The chronic co-morbidities include acute on chronic HFrEF, NICM, HLD, DM2  * I certify that at the point of admission it is my clinical judgment that the patient will require inpatient hospital care spanning beyond 2 midnights from the point of admission due to high intensity of service, high risk for further deterioration and high frequency of surveillance required.*   For questions or updates, please contact [place] Please consult [email or web address] for contact info under   Signed, Dion Body, MD  11/14/2020 8:18 AM   Attending Addendum:  History and all data above reviewed.  Patient examined.  I agree with the findings as above.  All available labs, radiology testing, previous records reviewed. Agree with documented assessment and plan. [name] is a 36F with  diabetes, chronic systolic and diastolic heart failure, nonischemic cardiomyopathy, hypertension, prior tobacco abuse, iron deficiency anemia, and CKD admitted with acute onset of chest pain.  She was recently hospitalized with acute on chronic systolic and diastolic heart failure.  During that hospitalization she underwent right heart catheterization as outlined above.  She is doing well after discharge and had been instructed to reduce her torsemide.  However she was reluctant to go down from 60 mg to 30 mg as instructed.  Therefore she only reduced it to 50 mg.  She awoke on the night prior to presentation with acute onset left-sided chest pain.  She previously had left heart cath last year that revealed   no significant CAD.  Cardiac enzymes are essentially flat from her admission 2 weeks ago.  There are no acute ischemic changes on EKG.  CRP is elevated and ESR is within normal limits.  There is a tiny pericardial effusion on echo.  Her LVEF has improved slightly to 25% from less than 20 previously.  We will continue guideline directed medical therapy and hold her torsemide.  I did instruct her to liberalize her fluid intake today.  She does still have some crackles on exam, but I suspect we will have to be slow with her diuresis.  Plan to resume torsemide at 30 mg tomorrow if her renal function is stable to improving.  She was started on colchicine for pericarditis and seems to be feeling a little bit better.  Plan to continue treatment for 1 month.  We will determine her NSAID regimen based on her renal function tomorrow.  Aubreana Cornacchia C. Oval Linsey, MD, [facility]  11/14/2020 1:02 PM

## 2020-11-14 NOTE — ED Notes (Signed)
Report given to Emelda Fear, RN of [phone]

## 2020-11-15 DIAGNOSIS — I300 Acute nonspecific idiopathic pericarditis: Secondary | ICD-10-CM

## 2020-11-15 LAB — BASIC METABOLIC PANEL
Anion gap: 8 (ref 5–15)
BUN: 43 mg/dL — ABNORMAL HIGH (ref 8–23)
CO2: 23 mmol/L (ref 22–32)
Calcium: 8.5 mg/dL — ABNORMAL LOW (ref 8.9–10.3)
Chloride: 105 mmol/L (ref 98–111)
Creatinine, Ser: 1.25 mg/dL — ABNORMAL HIGH (ref 0.44–1.00)
GFR, Estimated: 46 mL/min — ABNORMAL LOW (ref >60)
Glucose, Bld: 101 mg/dL — ABNORMAL HIGH (ref 70–99)
Potassium: 5 mmol/L (ref 3.5–5.1)
Sodium: 136 mmol/L (ref 135–145)

## 2020-11-15 LAB — GLUCOSE, CAPILLARY
Glucose-Capillary: 104 mg/dL — ABNORMAL HIGH (ref 70–99)
Glucose-Capillary: 133 mg/dL — ABNORMAL HIGH (ref 70–99)

## 2020-11-15 MED ORDER — COLCHICINE 0.6 MG PO TABS: 0.6000 mg | ORAL_TABLET | Freq: Every day | ORAL | 0 refills | Status: DC

## 2020-11-15 MED ORDER — IBUPROFEN 800 MG PO TABS: 800.0000 mg | ORAL_TABLET | Freq: Three times a day (TID) | ORAL | 0 refills | Status: AC

## 2020-11-15 MED ORDER — TORSEMIDE 20 MG PO TABS
30.0000 mg | ORAL_TABLET | Freq: Every day | ORAL | Status: DC
  Administered 2020-11-15: 30 mg via ORAL
  Filled 2020-11-15: qty 2

## 2020-11-15 MED ORDER — ENSURE ENLIVE PO LIQD: 237.0000 mL | Freq: Three times a day (TID) | ORAL | Status: DC

## 2020-11-15 MED ORDER — PROSOURCE PLUS PO LIQD
30.0000 mL | Freq: Two times a day (BID) | ORAL | Status: DC
  Filled 2020-11-15: qty 30

## 2020-11-15 NOTE — Discharge Summary (Addendum)
Discharge Summary    Patient ID: [name] MRN: [id number]; DOB: Jan 10, 1951  Admit date: 11/13/2020 Discharge date: 11/15/2020  PCP:  Enid Skeens., MD   [facility] Providers Cardiologist:  Jenean Lindau, MD  Electrophysiologist:  Constance Haw, MD  {     Discharge Diagnoses    Principal Problem:   Pericarditis Active Problems:   Diabetes mellitus, type 2 ([place])   Hypertension   Acute combined systolic and diastolic heart failure ([place])   Atypical Angina pectoris ([place])   Nonischemic cardiomyopathy ([place])    Diagnostic Studies/Procedures    Echo from 11/14/20:   1. Compared with the ehco 5/64/33, systolic function has improved from  <20% to 25-30%.   2. Left ventricular ejection fraction, by estimation, is 25 to 30%. The  left ventricle has severely decreased function. The left ventricle  demonstrates global hypokinesis. Left ventricular diastolic parameters are  consistent with Grade II diastolic  dysfunction (pseudonormalization). Elevated left ventricular end-diastolic  pressure.   3. Right ventricular systolic function is mildly reduced. The right  ventricular size is normal. There is normal pulmonary artery systolic  pressure.   4. Left atrial size was severely dilated.   5. Right atrial size was severely dilated.   6. There is no evidence of cardiac tamponade.   7. The mitral valve is normal in structure. Trivial mitral valve  regurgitation. No evidence of mitral stenosis.   8. The aortic valve is tricuspid. Aortic valve regurgitation is not  visualized. No aortic stenosis is present.   9. The inferior vena cava is normal in size with greater than 50%  respiratory variability, suggesting right atrial pressure of 3 mmHg.  _____________   History of Present Illness     [name] was discharged recently on 10/29/20 following a 4-day stay for HF exacerbation.  Her EF has been severely reduced at 25% since 03/2020.  She was on optimal GDMT  with quadruple therapy of Entresto/Jardiance/spironolactone/Toprol along with torsemide.  She was found to have a significant right-sided pleural effusion for which she underwent thoracentesis with 1.9 L fluid removal which significantly improved her shortness of breath.  Her BNP during that admission was elevated over assay (>4500) with mildly elevated but flat hsT (90-92).  She underwent RHC which only showed mildly elevated filling pressures (RA 11, RV 56/12, PA 50/21, m 34, PCWP 20, CO 4.8, CI 2.7) and preserved CO/CI despite her severely reduced EF.  She was diuresed but her BUN started to increase so she was transitioned to p.o. diuretics and discharged home.   She was doing fairly well following hospital discharge and continued to lose a significant amount of weight while on torsemide 60 mg p.o. daily.  She noticed a dramatic improvement in her ability to complete daily ADLs and get around the house.  She also noticed that her PND, orthopnea and lower extremity edema has significantly improved.  Unfortunately she woke up the morning of 11/13/20 and felt central chest discomfort of 7-11/2008 severity that was crushing with no radiation.  She took 5 sublingual nitroglycerin without any effect and had associated nausea during the same time.  She has never had pain in the same area before and has never been this severe.  This pain persisted for several hours and did not change with exertional activity before it gradually went away when she was on her way to the hospital.  She did have nausea but denied any emesis.  She denies any recent sick contacts  and did not have any fever/chills.  She has received her COVID vaccinations with her booster in December.  She cannot recall whether her chest discomfort was positional in nature.  Her urine output was the same as it had been in the past several weeks without any decline.  She was recommended to decrease her torsemide to 30 mg p.o. daily however has been slowly  decreasing it over the past few days and was still taking torsemide 50 mg p.o. daily prior to this admission.  She currently sleeps on 1 pillow denies any PND orthopnea but it does take her several minutes to go up the stairs with mild shortness of breath/DOE.  Her O2 sats at home are always in the high 80s to low 90s on room air.   VS during admission evaluation were at her baseline (HR 77, BP 91/51, MAP 63, SpO2 89-94% on RA).   On review of her weight logs she has lost over 55 pounds in the past 30 days (168-169 on 07/01, 110-121 lb over the past week).  Her weight loss trends seem fairly reliable as she almost lost 1 to 2 pounds each day on diuretics.  Hospital Course     Consultants: N/A  Chest pain 2/2 pericarditis - presented with chest pain on 11/13/20, nausea, no relief from Nitro - Hs trop 99 >97 - EKG no acute ischemic changes - CPR elevated 8.5 , ESR wnl - LHC from 03/29/20 showed Angiographically Normal Coronary Arteries in the Left Coronary System - Echo 11/14/20 showed EF 25-30%, grade II DD, RV systolic function mildly reduced , bilateral atrium severely dilated, trivial pericardial effusion  - likely demand ischemia, started on ibuprofen 879m TID, Colchicine 0.625mdaily,  symptoms much improved, will continue ibuprofen for 1 week and colchicine for 1 month at time of DC - consider repeat BMP in 1-2 weeks outpatient , follow up arranged on 12/02/20 with Dr ReSalley Scarlet  Chronic systolic and diastolic heart failure NICM Moderate pulmonary HTN - recently hospitalized here for CHF exacerbation, EF has been reduced to 25% since 03/2020,  RHSoldiern 10/27/20  which only showed mildly elevated filling pressures (RA 11, RV 56/12, PA 50/21, m 34, PCWP 20, CO 4.8, CI 2.7), mixed pulmonary HTN, and preserved CO/CI despite her severely reduced EF. - BNP 2124 POA, improved from >4500 on last hospitalization - she was instructed to reduce torsemide from 6056mo 40m11mom last discharge, but  reluctant to do so and only reduced to 50mg77mly at home, torsemide was held due to Cr rising to  1.43 yesterday, Cr improving today 1.25, resumed torsemide at 40mg 60my today - clinically euvolemic today - GDMT: continue Entresto 24/26 BID /Jardiance 10 /spironolactone 25 /Toprol 25 - consider repeat BMP in 1-2 weeks at follow up appointment  - Potassium 20 M EQ stopped given concurrent use of spironolactone, Entresto; repeat BMP in 1 to 2 weeks to reassess needs, K is 5 today   AKI on CKD stage III - Cr uptrended to 1.43 yesterday, in the setting of low BP,  Ibuprofen use, colchicine use, and self doing torsemide at 50mg d48m since last discharge for CHF - torsemide held yesterday with fluid liberation, Cr downtrended today to 1.25 - will resume torsemide at 30 mg daily, continue ibuprofen and colchicine - consider repeat BMP in 1-2 weeks at follow up appointment    HLD - on simvastatin 20mg da43m follow lipid outpatient   Type 2 DM - on metformin, Jardiance ;  resumed at DC  - A1C 6.1% on 10/26/20, fairly controlled      Did the patient have an acute coronary syndrome (MI, NSTEMI, STEMI, etc) this admission?:  No                               Did the patient have a percutaneous coronary intervention (stent / angioplasty)?:  No.       _____________  Discharge Vitals Blood pressure 96/60, pulse 71, temperature 98.2 F (36.8 C), temperature source Oral, resp. rate 20, height _0  (1.626 m), weight 51.7 kg, SpO2 96 %.  Filed Weights   11/13/20 1509 11/14/20 1611 11/15/20 0509  Weight: 49.9 kg 51.6 kg 51.7 kg    Labs & Radiologic Studies    CBC Recent Labs    11/13/20 1515  WBC 10.6*  NEUTROABS 9.0*  HGB 16.0*  HCT 49.2*  MCV 95.5  PLT 993   Basic Metabolic Panel Recent Labs    11/14/20 0441 11/15/20 0103  NA 140 136  K 3.4* 5.0  CL 103 105  CO2 28 23  GLUCOSE 78 101*  BUN 46* 43*  CREATININE 1.43* 1.25*  CALCIUM 8.7* 8.5*  MG 2.4  --    Liver Function  Tests No results for input(s): AST, ALT, ALKPHOS, BILITOT, PROT, ALBUMIN in the last 72 hours. No results for input(s): LIPASE, AMYLASE in the last 72 hours. High Sensitivity Troponin:   Recent Labs  Lab 10/26/20 0335 10/26/20 0521 11/13/20 1515 11/13/20 1703  TROPONINIHS 90* 92* 99* 97*    BNP Invalid input(s): POCBNP D-Dimer No results for input(s): DDIMER in the last 72 hours. Hemoglobin A1C No results for input(s): HGBA1C in the last 72 hours. Fasting Lipid Panel No results for input(s): CHOL, HDL, LDLCALC, TRIG, CHOLHDL, LDLDIRECT in the last 72 hours. Thyroid Function Tests No results for input(s): TSH, T4TOTAL, T3FREE, THYROIDAB in the last 72 hours.  Invalid input(s): FREET3 _____________  DG Chest 1 View  Result Date: 10/26/2020 CLINICAL DATA:  Post right sided thoracentesis EXAM: CHEST  1 VIEW COMPARISON:  Earlier film the same day. FINDINGS: No pneumothorax. Decrease in right pleural effusion with small residual. Improved aeration at the right lung base. Persistent diffuse interstitial infiltrates or edema bilaterally, appearing marginally improved. Heart size upper limits normal for technique. Aortic Atherosclerosis (ICD10-170.0). Visualized bones unremarkable. IMPRESSION: 1. No pneumothorax post thoracentesis. Electronically Signed   By: Lucrezia Europe M.D.   On: 10/26/2020 11:21   DG Chest 2 View  Result Date: 11/13/2020 CLINICAL DATA:  Chest pain and shortness of breath. EXAM: CHEST - 2 VIEW COMPARISON:  10/26/2020 FINDINGS: Improved inspiration with no gross change in enlargement of the cardiac silhouette. The aorta remains tortuous and calcified. A small right pleural effusion is again demonstrated without significant change. The pulmonary vasculature and interstitial markings remain prominent. Diffuse osteo. IMPRESSION: 1. Stable cardiomegaly, pulmonary vascular congestion and chronic interstitial lung disease with possible mild superimposed interstitial pulmonary edema.  2. Stable small right pleural effusion. Electronically Signed   By: Claudie Revering M.D.   On: 11/13/2020 16:08   DG Chest 2 View  Result Date: 10/26/2020 CLINICAL DATA:  Shortness of breath EXAM: CHEST - 2 VIEW COMPARISON:  03/03/2020 FINDINGS: New right pleural effusion occupying approximately 50% of the right hemithoracic volume. Right basilar atelectasis. No focal consolidation in the left lung. Mild cardiomegaly with calcific aortic atherosclerosis. IMPRESSION: New right pleural effusion occupying approximately 50%  of the right hemithoracic volume. Electronically Signed   By: Ulyses Jarred M.D.   On: 10/26/2020 03:23   CARDIAC CATHETERIZATION  Result Date: 10/27/2020 Findings: RA = 11 RV = 56/12 PA = 50/21 (34) PCW = 20 Fick cardiac output/index = 4.8/2.7 PVR = 2.9 WU Ao sat = 99% PA sat = 71%, 72% SVC sat = 68% PaPi = 2.6 Assessment: 1. Mildly elevated biventricular filling pressures 2. Mild mixed pulmonary HTN 3. Normal cardiac output Plan/Discussion: Continue medical therapy. Glori Bickers, MD 12:17 PM   ECHOCARDIOGRAM COMPLETE  Result Date: 11/14/2020    ECHOCARDIOGRAM REPORT   Patient Name:   CHAUNA OSORIA Date of Exam: 11/14/2020 Medical Rec #:  [id number]          Height:       64.0 in Accession #:    [id number]         Weight:       110.0 lb Date of Birth:  09/01/1950           BSA:          1.517 m Patient Age:    10 years           BP:           86/54 mmHg Patient Gender: F                  HR:           72 bpm. Exam Location:  Inpatient Procedure: 2D Echo, Cardiac Doppler and Color Doppler                               MODIFIED REPORT: This report was modified by Skeet Latch MD on 11/14/2020 due to effusion.  Indications:     I31.3 Pericardial effusion  History:         Patient has prior history of Echocardiogram examinations, most                  recent 10/26/2020. CHF and Cardiomyopathy, Pulmonary HTN; Risk                  Factors:Hypertension, Diabetes, Dyslipidemia and Former  Smoker.                  CKD. Pericardial Effusion.  Sonographer:     Jonelle Sidle Dance Referring Phys:  [id number] Dion Body Diagnosing Phys: Skeet Latch MD IMPRESSIONS  1. Compared with the ehco 6/78/93, systolic function has improved from <20% to 25-30%.  2. Left ventricular ejection fraction, by estimation, is 25 to 30%. The left ventricle has severely decreased function. The left ventricle demonstrates global hypokinesis. Left ventricular diastolic parameters are consistent with Grade II diastolic dysfunction (pseudonormalization). Elevated left ventricular end-diastolic pressure.  3. Right ventricular systolic function is mildly reduced. The right ventricular size is normal. There is normal pulmonary artery systolic pressure.  4. Left atrial size was severely dilated.  5. Right atrial size was severely dilated.  6. There is no evidence of cardiac tamponade.  7. The mitral valve is normal in structure. Trivial mitral valve regurgitation. No evidence of mitral stenosis.  8. The aortic valve is tricuspid. Aortic valve regurgitation is not visualized. No aortic stenosis is present.  9. The inferior vena cava is normal in size with greater than 50% respiratory variability, suggesting right atrial pressure of 3 mmHg. FINDINGS  Left Ventricle: Left ventricular ejection fraction, by estimation,  is 25 to 30%. The left ventricle has severely decreased function. The left ventricle demonstrates global hypokinesis. The left ventricular internal cavity size was normal in size. There is no left ventricular hypertrophy. Left ventricular diastolic parameters are consistent with Grade II diastolic dysfunction (pseudonormalization). Elevated left ventricular end-diastolic pressure. Right Ventricle: The right ventricular size is normal. No increase in right ventricular wall thickness. Right ventricular systolic function is mildly reduced. There is normal pulmonary artery systolic pressure. The tricuspid regurgitant  velocity is 2.37 m/s, and with an assumed right atrial pressure of 3 mmHg, the estimated right ventricular systolic pressure is 30.1 mmHg. Left Atrium: Left atrial size was severely dilated. Right Atrium: Right atrial size was severely dilated. Pericardium: Trivial pericardial effusion is present. There is no evidence of cardiac tamponade. Mitral Valve: The mitral valve is normal in structure. Mild mitral annular calcification. Trivial mitral valve regurgitation. No evidence of mitral valve stenosis. Tricuspid Valve: The tricuspid valve is normal in structure. Tricuspid valve regurgitation is trivial. No evidence of tricuspid stenosis. Aortic Valve: The aortic valve is tricuspid. Aortic valve regurgitation is not visualized. No aortic stenosis is present. Pulmonic Valve: The pulmonic valve was normal in structure. Pulmonic valve regurgitation is not visualized. No evidence of pulmonic stenosis. Aorta: The aortic root is normal in size and structure. Venous: The inferior vena cava is normal in size with greater than 50% respiratory variability, suggesting right atrial pressure of 3 mmHg. IAS/Shunts: No atrial level shunt detected by color flow Doppler. Additional Comments: Mild ascites is present.  LEFT VENTRICLE PLAX 2D LVIDd:         5.40 cm  Diastology LVIDs:         3.90 cm  LV e' medial:    4.35 cm/s LV PW:         1.50 cm  LV E/e' medial:  16.9 LV IVS:        1.00 cm  LV e' lateral:   5.44 cm/s LVOT diam:     1.90 cm  LV E/e' lateral: 13.5 LV SV:         48 LV SV Index:   32 LVOT Area:     2.84 cm  RIGHT VENTRICLE            IVC RV Basal diam:  3.70 cm    IVC diam: 1.40 cm RV Mid diam:    2.90 cm RV S prime:     6.85 cm/s TAPSE (M-mode): 1.4 cm LEFT ATRIUM             Index       RIGHT ATRIUM           Index LA diam:        5.00 cm 3.30 cm/m  RA Area:     21.90 cm LA Vol (A2C):   84.3 ml 55.55 ml/m RA Volume:   67.70 ml  44.62 ml/m LA Vol (A4C):   67.7 ml 44.62 ml/m LA Biplane Vol: 77.1 ml 50.81 ml/m   AORTIC VALVE LVOT Vmax:   93.80 cm/s LVOT Vmean:  62.200 cm/s LVOT VTI:    0.171 m  AORTA Ao Root diam: 3.50 cm Ao Asc diam:  3.00 cm MITRAL VALVE               TRICUSPID VALVE MV Area (PHT): 3.46 cm    TR Peak grad:   22.5 mmHg MV Decel Time: 219 msec    TR Vmax:  237.00 cm/s MV E velocity: 73.70 cm/s MV A velocity: 70.30 cm/s  SHUNTS MV E/A ratio:  1.05        Systemic VTI:  0.17 m                            Systemic Diam: 1.90 cm Skeet Latch MD Electronically signed by Skeet Latch MD Signature Date/Time: 11/14/2020/12:05:30 PM    Final (Updated)    ECHOCARDIOGRAM COMPLETE  Result Date: 10/26/2020    ECHOCARDIOGRAM REPORT   Patient Name:   LUPE HANDLEY Date of Exam: 10/26/2020 Medical Rec #:  [id number]          Height:       64.0 in Accession #:    [id number]         Weight:       167.0 lb Date of Birth:  Mar 22, 1951           BSA:          1.812 m Patient Age:    36 years           BP:           127/75 mmHg Patient Gender: F                  HR:           82 bpm. Exam Location:  Inpatient Procedure: 2D Echo, Cardiac Doppler and Color Doppler Indications:    CHF  History:        Patient has prior history of Echocardiogram examinations, most                 recent 03/15/2021. CHF, CAD, Signs/Symptoms:Shortness of Breath                 and Pleural effusion, LE edema; Risk Factors:Hypertension,                 Diabetes, Dyslipidemia and Current Smoker.  Sonographer:    Dustin Flock Referring Phys: [place]  1. Left ventricular ejection fraction, by estimation, is <20%. The left ventricle has severely decreased function. The left ventricle demonstrates global hypokinesis. Left ventricular diastolic parameters are consistent with Grade II diastolic dysfunction (pseudonormalization). Elevated left ventricular end-diastolic pressure.  2. Right ventricular systolic function is moderately reduced. The right ventricular size is normal. There is mildly elevated pulmonary  artery systolic pressure. The estimated right ventricular systolic pressure is 60.7 mmHg.  3. Left atrial size was severely dilated.  4. Right atrial size was severely dilated.  5. The mitral valve is normal in structure. Trivial mitral valve regurgitation. No evidence of mitral stenosis.  6. Tricuspid valve regurgitation is mild to moderate.  7. The aortic valve is tricuspid. Aortic valve regurgitation is trivial. Mild aortic valve sclerosis is present, with no evidence of aortic valve stenosis.  8. The inferior vena cava is normal in size with <50% respiratory variability, suggesting right atrial pressure of 8 mmHg. FINDINGS  Left Ventricle: Left ventricular ejection fraction, by estimation, is <20%. The left ventricle has severely decreased function. The left ventricle demonstrates global hypokinesis. The left ventricular internal cavity size was normal in size. There is no  left ventricular hypertrophy. Left ventricular diastolic parameters are consistent with Grade II diastolic dysfunction (pseudonormalization). Elevated left ventricular end-diastolic pressure. Right Ventricle: The right ventricular size is normal. No increase in right ventricular wall thickness. Right ventricular systolic function is moderately  reduced. There is mildly elevated pulmonary artery systolic pressure. The tricuspid regurgitant velocity is 2.96 m/s, and with an assumed right atrial pressure of 8 mmHg, the estimated right ventricular systolic pressure is 37.8 mmHg. Left Atrium: Left atrial size was severely dilated. Right Atrium: Right atrial size was severely dilated. Pericardium: There is no evidence of pericardial effusion. Mitral Valve: The mitral valve is normal in structure. Trivial mitral valve regurgitation. No evidence of mitral valve stenosis. Tricuspid Valve: The tricuspid valve is normal in structure. Tricuspid valve regurgitation is mild to moderate. No evidence of tricuspid stenosis. Aortic Valve: The aortic valve is  tricuspid. Aortic valve regurgitation is trivial. Mild aortic valve sclerosis is present, with no evidence of aortic valve stenosis. Pulmonic Valve: The pulmonic valve was normal in structure. Pulmonic valve regurgitation is trivial. No evidence of pulmonic stenosis. Aorta: The aortic root is normal in size and structure. Venous: The inferior vena cava is normal in size with less than 50% respiratory variability, suggesting right atrial pressure of 8 mmHg. IAS/Shunts: No atrial level shunt detected by color flow Doppler.  LEFT VENTRICLE PLAX 2D LVIDd:         4.60 cm      Diastology LVIDs:         4.40 cm      LV e' medial:    3.00 cm/s LV PW:         0.90 cm      LV E/e' medial:  28.0 LV IVS:        1.00 cm      LV e' lateral:   4.20 cm/s LVOT diam:     2.30 cm      LV E/e' lateral: 20.0 LV SV:         63 LV SV Index:   35 LVOT Area:     4.15 cm  LV Volumes (MOD) LV vol d, MOD A4C: 149.0 ml LV vol s, MOD A4C: 89.0 ml LV SV MOD A4C:     149.0 ml RIGHT VENTRICLE RV Basal diam:  3.70 cm RV S prime:     7.80 cm/s TAPSE (M-mode): 2.2 cm LEFT ATRIUM             Index       RIGHT ATRIUM           Index LA diam:        5.10 cm 2.81 cm/m  RA Area:     26.90 cm LA Vol (A2C):   89.3 ml 49.28 ml/m RA Volume:   98.80 ml  54.52 ml/m LA Vol (A4C):   87.8 ml 48.45 ml/m LA Biplane Vol: 95.8 ml 52.87 ml/m  AORTIC VALVE LVOT Vmax:   86.20 cm/s LVOT Vmean:  49.100 cm/s LVOT VTI:    0.151 m  AORTA Ao Root diam: 3.20 cm MITRAL VALVE               TRICUSPID VALVE MV Area (PHT): 3.48 cm    TR Peak grad:   35.0 mmHg MV Decel Time: 218 msec    TR Vmax:        296.00 cm/s MV E velocity: 84.00 cm/s MV A velocity: 59.10 cm/s  SHUNTS MV E/A ratio:  1.42        Systemic VTI:  0.15 m                            Systemic Diam: 2.30 cm Fransico Him  MD Electronically signed by Fransico Him MD Signature Date/Time: 10/26/2020/2:25:43 PM    Final    [place] ASCITES (ABDOMEN LIMITED)  Result Date: 10/26/2020 CLINICAL DATA:  70 year old female with  history of volume overload. Evaluate for ascites. EXAM: LIMITED ABDOMEN ULTRASOUND FOR ASCITES TECHNIQUE: Limited ultrasound survey for ascites was performed in all four abdominal quadrants. COMPARISON:  None. FINDINGS: Small volume of ascites noted. IMPRESSION: 1. Study is positive for small volume of ascites. Electronically Signed   By: Vinnie Langton M.D.   On: 10/26/2020 12:06   US THORACENTESIS ASP PLEURAL SPACE W/IMG GUIDE  Result Date: 10/26/2020 INDICATION: Shortness of breath. Large right pleural effusion. Request diagnostic and therapeutic thoracentesis. EXAM: ULTRASOUND GUIDED RIGHT THORACENTESIS MEDICATIONS: 1% plain lidocaine, 5 mL COMPLICATIONS: None immediate.  No pneumothorax on follow-up chest radiograph. PROCEDURE: An ultrasound guided thoracentesis was thoroughly discussed with the patient and questions answered. The benefits, risks, alternatives and complications were also discussed. The patient understands and wishes to proceed with the procedure. Written consent was obtained. Ultrasound was performed to localize and mark an adequate pocket of fluid in the right chest. The area was then prepped and draped in the normal sterile fashion. 1% Lidocaine was used for local anesthesia. Under ultrasound guidance a 6 Fr Safe-T-Centesis catheter was introduced. Thoracentesis was performed. The catheter was removed and a dressing applied. FINDINGS: A total of approximately 1.9 L of clear yellow fluid was removed. Samples were sent to the laboratory as requested by the clinical team. IMPRESSION: Successful ultrasound guided right thoracentesis yielding 1.9 L of pleural fluid. Read by: Ascencion Dike PA-C Electronically Signed   By: Lucrezia Europe M.D.   On: 10/26/2020 11:32   Disposition   Patient was seen by MD during rounds, felt stable for discharge.  Discussed with patient regarding above medication change, agreed with sending prescription to [facility] and does not need refill for torsemide.  Pt is being  discharged home today in good condition.  She is agreeable with follow-up on 12/02/2020.  Follow-up Plans & Appointments     Follow-up Information     Revankar, Reita Cliche, MD Follow up on 12/02/2020.   Specialty: Cardiology Why: At 10 AM for cardiology follow-up appointment Contact information: [place] [address] [phone]                 Follow-up appointment made on 12/02/2020. Discharge Instructions     Diet - low sodium heart healthy   Complete by: As directed    Diet Carb Modified   Complete by: As directed    Discharge instructions   Complete by: As directed    Please follow-up with cardiology on 12/02/2020 as scheduled   Increase activity slowly   Complete by: As directed        Discharge Medications   Allergies as of 11/15/2020   No Known Allergies      Medication List     STOP taking these medications    potassium chloride SA 20 MEQ tablet Commonly known as: KLOR-CON       TAKE these medications    aspirin 81 MG EC tablet Take 81 mg by mouth every evening.   carboxymethylcellulose 0.5 % Soln Commonly known as: REFRESH PLUS Place 1 drop into both eyes 3 (three) times daily as needed for dry eyes (dry eye).   colchicine 0.6 MG tablet Take 1 tablet (0.6 mg total) by mouth daily. Start taking on: November 16, 2020   empagliflozin 10 MG Tabs tablet  Commonly known as: JARDIANCE Take 1 tablet (10 mg total) by mouth daily.   Eylea 2 MG/0.05ML Soln Generic drug: Aflibercept 2 mg by Intravitreal route every 8 (eight) weeks.   ferrous sulfate 325 (65 FE) MG tablet Take 325 mg by mouth 4 (four) times a week.   ibuprofen 800 MG tablet Commonly known as: ADVIL Take 1 tablet (800 mg total) by mouth 3 (three) times daily for 5 days.   metFORMIN 500 MG 24 hr tablet Commonly known as: GLUCOPHAGE-XR Take 500 mg by mouth every evening.   metoprolol succinate 25 MG 24 hr tablet Commonly known as: TOPROL-XL Take 1 tablet (25 mg  total) by mouth daily. Take with or immediately following a meal.   nitroGLYCERIN 0.4 MG SL tablet Commonly known as: NITROSTAT Place 0.4 mg under the tongue every 5 (five) minutes as needed for chest pain.   PreserVision AREDS 2+Multi Vit Caps Take 1 Dose by mouth daily.   sacubitril-valsartan 24-26 MG Commonly known as: ENTRESTO Take 1 tablet by mouth 2 (two) times daily.   simvastatin 20 MG tablet Commonly known as: ZOCOR Take 20 mg by mouth every evening.   spironolactone 25 MG tablet Commonly known as: ALDACTONE Take 1 tablet (25 mg total) by mouth daily.   torsemide 20 MG tablet Commonly known as: DEMADEX Take 1.5 tablets (30 mg total) by mouth daily. What changed: how much to take           Outstanding Labs/Studies   Consider repeat BMP in 1 to 2 weeks  Duration of Discharge Encounter   Greater than 30 minutes including physician time.  Signed, Margie Billet, NP 11/15/2020, 3:35 PM  Patient seen and examined.  Plan as discussed in my rounding note for today and outlined above. Minus Breeding  11/15/2020  3:51 PM

## 2020-11-15 NOTE — Progress Notes (Addendum)
Progress Note  Patient Name: TASMIA BLUMER Date of Encounter: 11/15/2020  [facility] Cardiologist: Jenean Lindau, MD   Subjective   She feels better today, reports resolved mid-sternum chest pain. She felt more SOB today, states he can't lay flat or on her side due to SOB. She denied any leg edema.   Inpatient Medications    Scheduled Meds:  colchicine  0.6 mg Oral Daily   empagliflozin  10 mg Oral Daily   enoxaparin (LOVENOX) injection  40 mg Subcutaneous Q24H   feeding supplement  237 mL Oral BID BM   ibuprofen  800 mg Oral TID   metFORMIN  500 mg Oral QPM   metoprolol succinate  25 mg Oral Daily   multivitamin  1 tablet Oral Daily   pantoprazole  40 mg Oral Daily   potassium chloride SA  20 mEq Oral Daily   sacubitril-valsartan  1 tablet Oral BID   simvastatin  20 mg Oral QPM   sodium chloride flush  3 mL Intravenous Q12H   spironolactone  25 mg Oral Daily   Continuous Infusions:  sodium chloride     PRN Meds: sodium chloride, acetaminophen, nitroGLYCERIN, ondansetron (ZOFRAN) IV, polyvinyl alcohol, sodium chloride flush   Vital Signs    Vitals:   11/14/20 2310 11/14/20 2317 11/15/20 0508 11/15/20 0509  BP: (!) 96/58  106/65   Pulse: 69  67   Resp: 14  17   Temp: 97.6 F (36.4 C)  (!) 97.4 F (36.3 C)   TempSrc: Oral  Oral   SpO2: 93% 90% 98%   Weight:    51.7 kg  Height:       No intake or output data in the 24 hours ending 11/15/20 0817 Last 3 Weights 11/15/2020 11/14/2020 11/13/2020  Weight (lbs) 114 lb 113 lb 12.1 oz 110 lb  Weight (kg) 51.71 kg 51.6 kg 49.896 kg      Telemetry    Sinus rhythm with ventricular rate of 60s, NSVT x7 runs noted over the past 24 hrs  - Personally Reviewed  ECG    N/A this AM  - Personally Reviewed  Physical Exam   GEN: No acute distress.  Cachexia  Neck: No JVD Cardiac: RRR, no murmurs, rubs, or gallops.  Respiratory: Clear to auscultation bilaterally. GI: Soft, nontender, non-distended  MS: No BLE  edema, mild venous stasis change of BLE,  No deformity. Neuro:  Nonfocal  Psych: Normal affect   Labs    High Sensitivity Troponin:   Recent Labs  Lab 10/26/20 0335 10/26/20 0521 11/13/20 1515 11/13/20 1703  TROPONINIHS 90* 92* 99* 97*      Chemistry Recent Labs  Lab 11/13/20 2013 11/14/20 0441 11/15/20 0103  NA 138 140 136  K 4.1 3.4* 5.0  CL 101 103 105  CO2 _0 GLUCOSE 136* 78 101*  BUN 37* 46* 43*  CREATININE 1.16* 1.43* 1.25*  CALCIUM 9.0 8.7* 8.5*  GFRNONAA 51* 39* 46*  ANIONGAP _1 Hematology Recent Labs  Lab 11/13/20 1515  WBC 10.6*  RBC 5.15*  HGB 16.0*  HCT 49.2*  MCV 95.5  MCH 31.1  MCHC 32.5  RDW 15.5  PLT 197    BNP Recent Labs  Lab 11/13/20 1515  BNP 2,124.0*     DDimer No results for input(s): DDIMER in the last 168 hours.   Radiology    DG Chest 2 View  Result Date: 11/13/2020 CLINICAL DATA:  Chest  pain and shortness of breath. EXAM: CHEST - 2 VIEW COMPARISON:  10/26/2020 FINDINGS: Improved inspiration with no gross change in enlargement of the cardiac silhouette. The aorta remains tortuous and calcified. A small right pleural effusion is again demonstrated without significant change. The pulmonary vasculature and interstitial markings remain prominent. Diffuse osteo. IMPRESSION: 1. Stable cardiomegaly, pulmonary vascular congestion and chronic interstitial lung disease with possible mild superimposed interstitial pulmonary edema. 2. Stable small right pleural effusion. Electronically Signed   By: Claudie Revering M.D.   On: 11/13/2020 16:08   ECHOCARDIOGRAM COMPLETE  Result Date: 11/14/2020    ECHOCARDIOGRAM REPORT   Patient Name:   ERMELINDA ECKERT Date of Exam: 11/14/2020 Medical Rec #:  [id number]          Height:       64.0 in Accession #:    [id number]         Weight:       110.0 lb Date of Birth:  [birth year]           BSA:          1.517 m Patient Age:    18 years           BP:           86/54 mmHg Patient Gender: F                   HR:           72 bpm. Exam Location:  Inpatient Procedure: 2D Echo, Cardiac Doppler and Color Doppler                               MODIFIED REPORT: This report was modified by Skeet Latch MD on 11/14/2020 due to effusion.  Indications:     I31.3 Pericardial effusion  History:         Patient has prior history of Echocardiogram examinations, most                  recent 10/26/2020. CHF and Cardiomyopathy, Pulmonary HTN; Risk                  Factors:Hypertension, Diabetes, Dyslipidemia and Former Smoker.                  CKD. Pericardial Effusion.  Sonographer:     Jonelle Sidle Dance Referring Phys:  [id number] Dion Body Diagnosing Phys: Skeet Latch MD IMPRESSIONS  1. Compared with the ehco 1/85/63, systolic function has improved from <20% to 25-30%.  2. Left ventricular ejection fraction, by estimation, is 25 to 30%. The left ventricle has severely decreased function. The left ventricle demonstrates global hypokinesis. Left ventricular diastolic parameters are consistent with Grade II diastolic dysfunction (pseudonormalization). Elevated left ventricular end-diastolic pressure.  3. Right ventricular systolic function is mildly reduced. The right ventricular size is normal. There is normal pulmonary artery systolic pressure.  4. Left atrial size was severely dilated.  5. Right atrial size was severely dilated.  6. There is no evidence of cardiac tamponade.  7. The mitral valve is normal in structure. Trivial mitral valve regurgitation. No evidence of mitral stenosis.  8. The aortic valve is tricuspid. Aortic valve regurgitation is not visualized. No aortic stenosis is present.  9. The inferior vena cava is normal in size with greater than 50% respiratory variability, suggesting right atrial pressure of 3 mmHg. FINDINGS  Left Ventricle: Left ventricular ejection fraction, by  estimation, is 25 to 30%. The left ventricle has severely decreased function. The left ventricle demonstrates global  hypokinesis. The left ventricular internal cavity size was normal in size. There is no left ventricular hypertrophy. Left ventricular diastolic parameters are consistent with Grade II diastolic dysfunction (pseudonormalization). Elevated left ventricular end-diastolic pressure. Right Ventricle: The right ventricular size is normal. No increase in right ventricular wall thickness. Right ventricular systolic function is mildly reduced. There is normal pulmonary artery systolic pressure. The tricuspid regurgitant velocity is 2.37 m/s, and with an assumed right atrial pressure of 3 mmHg, the estimated right ventricular systolic pressure is 16.1 mmHg. Left Atrium: Left atrial size was severely dilated. Right Atrium: Right atrial size was severely dilated. Pericardium: Trivial pericardial effusion is present. There is no evidence of cardiac tamponade. Mitral Valve: The mitral valve is normal in structure. Mild mitral annular calcification. Trivial mitral valve regurgitation. No evidence of mitral valve stenosis. Tricuspid Valve: The tricuspid valve is normal in structure. Tricuspid valve regurgitation is trivial. No evidence of tricuspid stenosis. Aortic Valve: The aortic valve is tricuspid. Aortic valve regurgitation is not visualized. No aortic stenosis is present. Pulmonic Valve: The pulmonic valve was normal in structure. Pulmonic valve regurgitation is not visualized. No evidence of pulmonic stenosis. Aorta: The aortic root is normal in size and structure. Venous: The inferior vena cava is normal in size with greater than 50% respiratory variability, suggesting right atrial pressure of 3 mmHg. IAS/Shunts: No atrial level shunt detected by color flow Doppler. Additional Comments: Mild ascites is present.  LEFT VENTRICLE PLAX 2D LVIDd:         5.40 cm  Diastology LVIDs:         3.90 cm  LV e' medial:    4.35 cm/s LV PW:         1.50 cm  LV E/e' medial:  16.9 LV IVS:        1.00 cm  LV e' lateral:   5.44 cm/s LVOT diam:      1.90 cm  LV E/e' lateral: 13.5 LV SV:         48 LV SV Index:   32 LVOT Area:     2.84 cm  RIGHT VENTRICLE            IVC RV Basal diam:  3.70 cm    IVC diam: 1.40 cm RV Mid diam:    2.90 cm RV S prime:     6.85 cm/s TAPSE (M-mode): 1.4 cm LEFT ATRIUM             Index       RIGHT ATRIUM           Index LA diam:        5.00 cm 3.30 cm/m  RA Area:     21.90 cm LA Vol (A2C):   84.3 ml 55.55 ml/m RA Volume:   67.70 ml  44.62 ml/m LA Vol (A4C):   67.7 ml 44.62 ml/m LA Biplane Vol: 77.1 ml 50.81 ml/m  AORTIC VALVE LVOT Vmax:   93.80 cm/s LVOT Vmean:  62.200 cm/s LVOT VTI:    0.171 m  AORTA Ao Root diam: 3.50 cm Ao Asc diam:  3.00 cm MITRAL VALVE               TRICUSPID VALVE MV Area (PHT): 3.46 cm    TR Peak grad:   22.5 mmHg MV Decel Time: 219 msec    TR Vmax:  237.00 cm/s MV E velocity: 73.70 cm/s MV A velocity: 70.30 cm/s  SHUNTS MV E/A ratio:  1.05        Systemic VTI:  0.17 m                            Systemic Diam: 1.90 cm Skeet Latch MD Electronically signed by Skeet Latch MD Signature Date/Time: 11/14/2020/12:05:30 PM    Final (Updated)     Cardiac Studies    RHC Result date: 10/27/20 RA = 11 RV = 56/12 PA = 50/21 (34) PCW = 20 Fick cardiac output/index = 4.8/2.7 PVR = 2.9 WU Ao sat = 99% PA sat = 71%, 72% SVC sat = 68% PaPi = 2.6   TTE Result date: 10/26/20  1. Left ventricular ejection fraction, by estimation, is <20%. The left  ventricle has severely decreased function. The left ventricle demonstrates  global hypokinesis. Left ventricular diastolic parameters are consistent  with Grade II diastolic  dysfunction (pseudonormalization). Elevated left ventricular end-diastolic  pressure.   2. Right ventricular systolic function is moderately reduced. The right  ventricular size is normal. There is mildly elevated pulmonary artery  systolic pressure. The estimated right ventricular systolic pressure is  54.6 mmHg.   3. Left atrial size was severely dilated.    4. Right atrial size was severely dilated.   5. The mitral valve is normal in structure. Trivial mitral valve  regurgitation. No evidence of mitral stenosis.   6. Tricuspid valve regurgitation is mild to moderate.   7. The aortic valve is tricuspid. Aortic valve regurgitation is trivial.  Mild aortic valve sclerosis is present, with no evidence of aortic valve  stenosis.   8. The inferior vena cava is normal in size with <50% respiratory  variability, suggesting right atrial pressure of 8 mmHg.   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 There is severe left ventricular systolic dysfunction. The left ventricular ejection fraction is less than 25% by visual estimate. LV end diastolic pressure is moderately elevated - 22 mmHg  Patient Profile     MANIAH NADING is a 70 y.o. female with chronic systolic and diastolic heart failure, NICM, moderate PH, DM2, remote DVT (2013), HTN, CKD, former tobacco use, and iron def anemia, cardiology is following for chest pain.   Assessment & Plan    Chest pain 2/2 pericarditis  - woke up with chest pain on 11/13/20, nausea, no relief from Nitro - Hs trop 99 >97  - EKG no acute ischemic changes - CPR elevated 8.5 , ESR wnl - LHC from 03/29/20 showed Angiographically Normal Coronary Arteries in the Left Coronary System - Echo 11/14/20 showed EF 25-30%, grade II DD, RV systolic function mildly reduced , bilateral atrium severely dilated, trivial pericardial effusion  - likely demand ischemia, started on ibuprofen 818m TID, Colchicine 0.639mdaily,  symptoms improving, will continue ibuprofen for 1 week and colchicine for 1 month  - will monitor renal function for tolerance of NSAIDs , Cr downtrending today   Chronic systolic and diastolic heart failure NICM Moderate pulmonary HTN  - recently  hospitalized here for CHF exacerbation, EF has been reduced to 25% since 03/2020,  RHC on 10/27/20  which only showed mildly elevated filling pressures (RA 11, RV 56/12, PA 50/21, m 34, PCWP 20, CO 4.8, CI 2.7), mixed pulmonary HTN, and  preserved CO/CI despite her severely reduced EF. - BNP 2124 POA, improved from >4500 on last hospitalization  - she was instructed to reduce torsemide from 57m to 377mfrom last discharge, but reluctant to do so and only reduced to 5047maily at home, torsemide currently held due to Cr rising to  1.43 yesterday, Cr improving today 1.25, will resume torsemide at 60m84mily today  - clinically euvolemic  - GDMT: continue Entresto 24/26 BID /Jardiance 10 /spironolactone 25 /Toprol 25    AKI on CKD stage III - Cr uptrended to 1.43 yesterday, in the setting of low BP,  Ibuprofen use, colchicine use, and self doing torsemide at 50mg16mly since last discharge for CHF - torsemide held yesterday with fluid liberation, Cr downtrended today - will resume torsemide at 30 mg daily, continue high dose ibuprofen and colchicine - will pause K supplement today with K 5 this AM  HLD - on simvastatin 20mg 65my, follow  lipid outpatient   Type 2 DM  - on metformin, Jardiance  - A1C 6.1% on 10/26/20, controlled     For questions or updates, please contact [facility] consult [email or web address] for contact info under        Signed, Xika ZMargie Billet8/04/2020, 8:17 AM    History and all data above reviewed.  Patient examined.  I agree with the findings as above.   She still has some discomfort lying on her left side.  No SOB.   The patient exam reveals COR:RRR, no rubs  ,  Lungs: Clear  ,  Abd: Positive bowel sounds, no rebound no guarding, Ext Trace edema  .  All available labs, radiology testing, previous records reviewed. Agree with documented assessment and plan.    Chronic systolic HF:  OK to resume lower dose Torsemide today.  Pericarditis:  Continue current therapy  with duration as listed above. Pericarditis:  OK to discharge on meds as listed with plan as above.  Chronic systolic HF:  OK to discharge with close follow up of her creat.  I discussed weights and diuretic dosing.  Home with Torsemide 30 mg daily for now.   James Jeneen Rinksein  1:11 PM  11/15/2020

## 2020-11-15 NOTE — Progress Notes (Signed)
Initial Nutrition Assessment  DOCUMENTATION CODES:  Severe malnutrition in context of chronic illness  INTERVENTION:  Increase Ensure Enlive from BID to TID.  Add 45 ml ProSource Plus po BID, each supplement provides 100 kcal and 15 grams of protein.   Add Magic cup TID with meals, each supplement provides 290 kcal and 9 grams of protein.  Continue ProSight MVI daily.  NUTRITION DIAGNOSIS:  Severe Malnutrition related to chronic illness (CHF) as evidenced by severe fat depletion, severe muscle depletion.  GOAL:  Patient will meet greater than or equal to 90% of their needs  MONITOR:  PO intake, Supplement acceptance, Labs, Weight trends, Skin, I & O's  REASON FOR ASSESSMENT:  Malnutrition Screening Tool    ASSESSMENT:  70 yo female with a PMH of chronic systolic/diastolic HF, NICM, moderate PH, T2DM, remote DVT (2013), HTN, CKD, former tobacco use, and iron deficiency anemia who presents with chest pain 2/2 pericarditis.  Pt reports this is a fluid shift from medication. Admitting MD confirms this. Pt with no appetite changes or PO intake changes.  Per Epic, pt has lost ~55 lbs (32.5%) in the last month.  Per MD note, "She was doing fairly well following hospital discharge and continued to lose a significant amount of weight while on torsemide 60 mg p.o. daily. On review of her weight logs she has lost over 55 pounds in the past 30 days (168-169 on 07/01, 110-121 lb over the past week).  Her weight loss trends seem fairly reliable as she almost lost 1 to 2 pounds each day on diuretics."  On exam, pt with severe depletions. Edema could still be masking some losses.  Recommend increasing Ensure Enlive to TID from BID, adding ProSource Plus BID, and Magic Cup TID.  Medications: reviewed; Jardiance, EE BID, metformin, Prosight MVI, Entresto, spironolactone  Labs: reviewed; CBG 106-133 HbA1c: 6.1% (10/2020)  NUTRITION - FOCUSED PHYSICAL EXAM: Flowsheet Row Most Recent Value   Orbital Region Severe depletion  Upper Arm Region Moderate depletion  Thoracic and Lumbar Region Moderate depletion  Buccal Region Severe depletion  Temple Region Moderate depletion  Clavicle Bone Region Severe depletion  Clavicle and Acromion Bone Region Severe depletion  Scapular Bone Region Moderate depletion  Dorsal Hand Mild depletion  Patellar Region Moderate depletion  Anterior Thigh Region Moderate depletion  Posterior Calf Region Moderate depletion  Edema (RD Assessment) Moderate  Hair Reviewed  Eyes Reviewed  Mouth Reviewed  Skin Reviewed  Nails Reviewed   Diet Order:   Diet Order             Diet regular Room service appropriate? Yes; Fluid consistency: Thin  Diet effective now                  EDUCATION NEEDS:  Education needs have been addressed  Skin:  Skin Assessment: Skin Integrity Issues: Skin Integrity Issues:: Stage II, Other (Comment) Stage II: Coccyx Other: Non-pressure wound - L pretibial  Last BM:  unknown  Height:  Ht Readings from Last 1 Encounters:  11/14/20 5\' 4"  (1.626 m)   Weight:  Wt Readings from Last 1 Encounters:  11/15/20 51.7 kg   BMI:  Body mass index is 19.57 kg/m.  Estimated Nutritional Needs:  Kcal:  1850-2050 Protein:  75-90 grams Fluid:  >1.85 L  01/15/21, RD, LDN (she/her/hers) Registered Dietitian I After-Hours/Weekend Pager # in [place]

## 2020-12-02 ENCOUNTER — Other Ambulatory Visit: Payer: Self-pay

## 2020-12-02 ENCOUNTER — Encounter: Payer: Self-pay | Admitting: Cardiology

## 2020-12-02 ENCOUNTER — Ambulatory Visit (INDEPENDENT_AMBULATORY_CARE_PROVIDER_SITE_OTHER): Payer: Medicare Other | Admitting: Cardiology

## 2020-12-02 VITALS — BP 100/64 | HR 68 | Ht 64.0 in | Wt 111.0 lb

## 2020-12-02 DIAGNOSIS — I5043 Acute on chronic combined systolic (congestive) and diastolic (congestive) heart failure: Secondary | ICD-10-CM | POA: Diagnosis not present

## 2020-12-02 DIAGNOSIS — I428 Other cardiomyopathies: Secondary | ICD-10-CM

## 2020-12-02 DIAGNOSIS — I2510 Atherosclerotic heart disease of native coronary artery without angina pectoris: Secondary | ICD-10-CM

## 2020-12-02 DIAGNOSIS — I10 Essential (primary) hypertension: Secondary | ICD-10-CM | POA: Diagnosis not present

## 2020-12-02 DIAGNOSIS — E782 Mixed hyperlipidemia: Secondary | ICD-10-CM | POA: Diagnosis not present

## 2020-12-02 DIAGNOSIS — E088 Diabetes mellitus due to underlying condition with unspecified complications: Secondary | ICD-10-CM

## 2020-12-02 NOTE — Addendum Note (Signed)
Addended by: Eleonore Chiquito on: 12/02/2020 10:46 AM   Modules accepted: Orders

## 2020-12-02 NOTE — Patient Instructions (Signed)
Medication Instructions:  No medication changes. *If you need a refill on your cardiac medications before your next appointment, please call your pharmacy*   Lab Work: Your physician recommends that you have a BMET in 2 weeks.  If you have labs (blood work) drawn today and your tests are completely normal, you will receive your results only by: MyChart Message (if you have MyChart) OR A paper copy in the mail If you have any lab test that is abnormal or we need to change your treatment, we will call you to review the results.   Testing/Procedures: None ordered   Follow-Up: At [facility], you and your health needs are our priority.  As part of our continuing mission to provide you with exceptional heart care, we have created designated Provider Care Teams.  These Care Teams include your primary Cardiologist (physician) and Advanced Practice Providers (APPs -  Physician Assistants and Nurse Practitioners) who all work together to provide you with the care you need, when you need it.  We recommend signing up for the patient portal called "MyChart".  Sign up information is provided on this After Visit Summary.  MyChart is used to connect with patients for Virtual Visits (Telemedicine).  Patients are able to view lab/test results, encounter notes, upcoming appointments, etc.  Non-urgent messages can be sent to your provider as well.   To learn more about what you can do with MyChart, go to [email or web address].    Your next appointment:   3 month(s)  The format for your next appointment:   In Person  Provider:   Belva Crome, MD   Other Instructions NA

## 2020-12-02 NOTE — Progress Notes (Signed)
Cardiology Office Note:    Date:  12/02/2020   ID:  [name], DOB 1950-07-17, MRN [id number]  PCP:  [name], MD  Cardiologist:  Garwin Brothers, MD   Referring MD: [name], MD    ASSESSMENT:    1. Acute on chronic combined systolic and diastolic CHF (congestive heart failure) (HCC)   2. Coronary artery disease involving native coronary artery of native heart without angina pectoris   3. Mixed hyperlipidemia   4. Primary hypertension   5. Nonischemic cardiomyopathy (HCC)   6. Diabetes mellitus due to underlying condition with unspecified complications (HCC)    PLAN:    In order of problems listed above:  Primary prevention stressed with the patient.  Importance of compliance with diet medication stressed and she vocalized understanding. Advanced cardiomyopathy with congestive heart failure: CONGESTIVE heart failure education was given to the patient at length and she vocalized understanding.  Questions were answered to her satisfaction.  She is weighing herself regularly and checking her vital signs.  Salt intake issues were discussed and she promises to comply. Essential hypertension: Blood pressure stable.  She is asymptomatic from borderline blood pressure and I think this is very optimal in view of her advanced cardiomyopathy. Dyslipidemia, diabetes mellitus: Lifestyle modification urged in view of the fact that she is on multiple medications I want her to be back in 2 weeks for blood work. Patient will be seen in follow-up appointment in 3 months or earlier if the patient has any concerns    Medication Adjustments/Labs and Tests Ordered: Current medicines are reviewed at length with the patient today.  Concerns regarding medicines are outlined above.  No orders of the defined types were placed in this encounter.  No orders of the defined types were placed in this encounter.    Chief Complaint  Patient presents with   Hospitalization  Follow-up     History of Present Illness:    [name] is a 70 y.o. female.  Patient has history of advanced cardiomyopathy.  She has history of congestive heart failure and has been admitted on repeated occasions for this.  She has history of essential hypertension dyslipidemia and diabetes mellitus.  She denies any problems at this time and she tells me that this is the best she has felt in a long time.  Her blood pressures are stable her weight is stable and she brought a very meticulous log of her blood pressure and vital signs readings from home.  She is happy about it.  At the time of my evaluation, the patient is alert awake oriented and in no distress.  Past Medical History:  Diagnosis Date   Abnormal nuclear stress test 03/29/2020   Chronic combined systolic and diastolic CHF (congestive heart failure) (HCC)    CKD (chronic kidney disease), stage II    Depression    Diabetes mellitus, type 2 (HCC)    DVT (deep venous thrombosis) (HCC) 07/2011   S/P coumadin x 6 months   Dyspnea on exertion 03/19/2020   HTN (hypertension)    Hyperlipidemia    Hypertension    Hypokalemia 05/08/2012   Iron deficiency anemia    Leukocytosis 05/08/2012   Mitral regurgitation    Nonischemic cardiomyopathy (HCC) 04/21/2020   Pericardial effusion    small by echo 07/2020   Pulmonary hypertension (HCC)    Tobacco abuse 05/08/2012   Tricuspid regurgitation     Past Surgical History:  Procedure Laterality Date   APPENDECTOMY  Bilateral oophrectomy     Secondary to benign cysts   colonoscopy     with polypectomy   LEFT HEART CATH AND CORONARY ANGIOGRAPHY N/A 03/29/2020   Procedure: LEFT HEART CATH AND CORONARY ANGIOGRAPHY;  Surgeon: Marykay Lex, MD;  Location: [facility] INVASIVE CV LAB;  Service: Cardiovascular;  Laterality: N/A;   RIGHT HEART CATH N/A 10/27/2020   Procedure: RIGHT HEART CATH;  Surgeon: Dolores Patty, MD;  Location: [facility] INVASIVE CV LAB;  Service: Cardiovascular;   Laterality: N/A;   TONSILLECTOMY      Current Medications: Current Meds  Medication Sig   Aflibercept (EYLEA) 2 MG/0.05ML SOLN 2 mg by Intravitreal route every 8 (eight) weeks.   aspirin 81 MG EC tablet Take 81 mg by mouth every evening.    carboxymethylcellulose (REFRESH PLUS) 0.5 % SOLN Place 1 drop into both eyes 3 (three) times daily as needed for dry eyes (dry eye).   colchicine 0.6 MG tablet Take 1 tablet (0.6 mg total) by mouth daily.   empagliflozin (JARDIANCE) 10 MG TABS tablet Take 1 tablet (10 mg total) by mouth daily.   ferrous sulfate 325 (65 FE) MG tablet Take 325 mg by mouth 4 (four) times a week.   metFORMIN (GLUCOPHAGE-XR) 500 MG 24 hr tablet Take 500 mg by mouth every evening.    metoprolol succinate (TOPROL-XL) 25 MG 24 hr tablet Take 1 tablet (25 mg total) by mouth daily. Take with or immediately following a meal.   Multiple Vitamins-Minerals (PRESERVISION AREDS 2+MULTI VIT) CAPS Take 1 Dose by mouth daily.   nitroGLYCERIN (NITROSTAT) 0.4 MG SL tablet Place 0.4 mg under the tongue every 5 (five) minutes as needed for chest pain.   sacubitril-valsartan (ENTRESTO) 24-26 MG Take 1 tablet by mouth 2 (two) times daily.   simvastatin (ZOCOR) 20 MG tablet Take 20 mg by mouth every evening.    spironolactone (ALDACTONE) 25 MG tablet Take 1 tablet (25 mg total) by mouth daily.   torsemide (DEMADEX) 20 MG tablet Take 1.5 tablets (30 mg total) by mouth daily.     Allergies:   Patient has no known allergies.   Social History   Socioeconomic History   Marital status: Married    Spouse name: Yaritzy Huser   Number of children: 2   Years of education: Not on file   Highest education level: Not on file  Occupational History   Occupation: Production manager for a company    Employer: [employer]  Tobacco Use   Smoking status: Former    Packs/day: 0.50    Years: 40.00    Pack years: 20.00    Types: Cigarettes    Quit date: 07/2020    Years since quitting: 0.3   Smokeless tobacco:  Never  Substance and Sexual Activity   Alcohol use: Yes    Alcohol/week: 15.0 standard drinks    Types: 15 Cans of beer per week   Drug use: No   Sexual activity: Never  Other Topics Concern   Not on file  Social History Narrative   Married.  Lives in husband here in [place].  Normally independent of ADLs.   Social Determinants of Health   Financial Resource Strain: Not on file  Food Insecurity: Not on file  Transportation Needs: Not on file  Physical Activity: Not on file  Stress: Not on file  Social Connections: Not on file     Family History: The patient's family history includes Alcoholism in her father; Breast cancer in her mother; CAD  in her paternal grandmother; COPD in her mother; Diabetes in her maternal grandmother and paternal grandmother; Liver disease in her father; Stroke in her maternal grandfather and paternal grandfather; Uterine cancer in her mother.  ROS:   Please see the history of present illness.    All other systems reviewed and are negative.  EKGs/Labs/Other Studies Reviewed:    The following studies were reviewed today: I discussed my findings with the patient at length and hospital records were reviewed.   Recent Labs: 10/26/2020: ALT 17; TSH 1.443 11/13/2020: B Natriuretic Peptide 2,124.0; Hemoglobin 16.0; Platelets 197 11/14/2020: Magnesium 2.4 11/15/2020: BUN 43; Creatinine, Ser 1.25; Potassium 5.0; Sodium 136  Recent Lipid Panel No results found for: CHOL, TRIG, HDL, CHOLHDL, VLDL, LDLCALC, LDLDIRECT  Physical Exam:    VS:  BP 100/64 (BP Location: Right Arm, Patient Position: Sitting, Cuff Size: Small)   Pulse 68   Ht 5\' 4"  (1.626 m)   Wt 111 lb (50.3 kg)   SpO2 96%   BMI 19.05 kg/m     Wt Readings from Last 3 Encounters:  12/02/20 111 lb (50.3 kg)  11/15/20 114 lb (51.7 kg)  11/05/20 122 lb (55.3 kg)     GEN: Patient is in no acute distress HEENT: Normal NECK: No JVD; No carotid bruits LYMPHATICS: No lymphadenopathy CARDIAC:  Hear sounds regular, 2/6 systolic murmur at the apex. RESPIRATORY:  Clear to auscultation without rales, wheezing or rhonchi  ABDOMEN: Soft, non-tender, non-distended MUSCULOSKELETAL:  No edema; No deformity  SKIN: Warm and dry NEUROLOGIC:  Alert and oriented x 3 PSYCHIATRIC:  Normal affect   Signed, 11/07/20, MD  12/02/2020 10:25 AM    [facility]

## 2020-12-15 LAB — BASIC METABOLIC PANEL
BUN/Creatinine Ratio: 33 — ABNORMAL HIGH (ref 12–28)
BUN: 40 mg/dL — ABNORMAL HIGH (ref 8–27)
CO2: 26 mmol/L (ref 20–29)
Calcium: 9.3 mg/dL (ref 8.7–10.3)
Chloride: 101 mmol/L (ref 96–106)
Creatinine, Ser: 1.22 mg/dL — ABNORMAL HIGH (ref 0.57–1.00)
Glucose: 107 mg/dL — ABNORMAL HIGH (ref 65–99)
Potassium: 4.7 mmol/L (ref 3.5–5.2)
Sodium: 141 mmol/L (ref 134–144)
eGFR: 48 mL/min/{1.73_m2} — ABNORMAL LOW (ref >59)

## 2020-12-27 ENCOUNTER — Other Ambulatory Visit: Payer: Self-pay

## 2020-12-27 MED ORDER — METOPROLOL SUCCINATE ER 25 MG PO TB24: 25.0000 mg | ORAL_TABLET | Freq: Every day | ORAL | 3 refills | Status: DC

## 2020-12-27 MED ORDER — TORSEMIDE 20 MG PO TABS: 30.0000 mg | ORAL_TABLET | Freq: Every day | ORAL | 3 refills | Status: DC

## 2021-01-06 ENCOUNTER — Ambulatory Visit: Payer: Medicare Other | Admitting: Cardiology

## 2021-01-07 ENCOUNTER — Ambulatory Visit: Payer: Medicare Other | Admitting: Cardiology

## 2021-01-08 ENCOUNTER — Other Ambulatory Visit: Payer: Self-pay | Admitting: Cardiology

## 2021-01-10 ENCOUNTER — Emergency Department (HOSPITAL_COMMUNITY)
Admission: EM | Admit: 2021-01-10 | Discharge: 2021-01-11 | Disposition: A | Discharge status: Home / Self Care | Payer: Medicare Other | Attending: Emergency Medicine | Admitting: Emergency Medicine

## 2021-01-10 ENCOUNTER — Ambulatory Visit (INDEPENDENT_AMBULATORY_CARE_PROVIDER_SITE_OTHER): Payer: Medicare Other

## 2021-01-10 ENCOUNTER — Other Ambulatory Visit: Payer: Self-pay

## 2021-01-10 ENCOUNTER — Emergency Department (HOSPITAL_COMMUNITY): Payer: Medicare Other

## 2021-01-10 DIAGNOSIS — R61 Generalized hyperhidrosis: Secondary | ICD-10-CM | POA: Diagnosis not present

## 2021-01-10 DIAGNOSIS — R55 Syncope and collapse: Secondary | ICD-10-CM

## 2021-01-10 DIAGNOSIS — Z7982 Long term (current) use of aspirin: Secondary | ICD-10-CM | POA: Insufficient documentation

## 2021-01-10 DIAGNOSIS — Z79899 Other long term (current) drug therapy: Secondary | ICD-10-CM | POA: Insufficient documentation

## 2021-01-10 DIAGNOSIS — R42 Dizziness and giddiness: Secondary | ICD-10-CM | POA: Diagnosis not present

## 2021-01-10 DIAGNOSIS — I428 Other cardiomyopathies: Secondary | ICD-10-CM | POA: Diagnosis not present

## 2021-01-10 DIAGNOSIS — I130 Hypertensive heart and chronic kidney disease with heart failure and stage 1 through stage 4 chronic kidney disease, or unspecified chronic kidney disease: Secondary | ICD-10-CM | POA: Insufficient documentation

## 2021-01-10 DIAGNOSIS — Z7984 Long term (current) use of oral hypoglycemic drugs: Secondary | ICD-10-CM | POA: Insufficient documentation

## 2021-01-10 DIAGNOSIS — E1122 Type 2 diabetes mellitus with diabetic chronic kidney disease: Secondary | ICD-10-CM | POA: Insufficient documentation

## 2021-01-10 DIAGNOSIS — I5042 Chronic combined systolic (congestive) and diastolic (congestive) heart failure: Secondary | ICD-10-CM | POA: Diagnosis not present

## 2021-01-10 DIAGNOSIS — N182 Chronic kidney disease, stage 2 (mild): Secondary | ICD-10-CM | POA: Diagnosis not present

## 2021-01-10 DIAGNOSIS — I2510 Atherosclerotic heart disease of native coronary artery without angina pectoris: Secondary | ICD-10-CM | POA: Diagnosis not present

## 2021-01-10 DIAGNOSIS — Z955 Presence of coronary angioplasty implant and graft: Secondary | ICD-10-CM | POA: Diagnosis not present

## 2021-01-10 DIAGNOSIS — Z87891 Personal history of nicotine dependence: Secondary | ICD-10-CM | POA: Insufficient documentation

## 2021-01-10 LAB — CBC
HCT: 46.2 % — ABNORMAL HIGH (ref 36.0–46.0)
Hemoglobin: 15.2 g/dL — ABNORMAL HIGH (ref 12.0–15.0)
MCH: 31.4 pg (ref 26.0–34.0)
MCHC: 32.9 g/dL (ref 30.0–36.0)
MCV: 95.5 fL (ref 80.0–100.0)
Platelets: 170 10*3/uL (ref 150–400)
RBC: 4.84 MIL/uL (ref 3.87–5.11)
RDW: 17.3 % — ABNORMAL HIGH (ref 11.5–15.5)
WBC: 7.3 10*3/uL (ref 4.0–10.5)
nRBC: 0 % (ref 0.0–0.2)

## 2021-01-10 LAB — BASIC METABOLIC PANEL
Anion gap: 12 (ref 5–15)
BUN: 43 mg/dL — ABNORMAL HIGH (ref 8–23)
CO2: 25 mmol/L (ref 22–32)
Calcium: 9.1 mg/dL (ref 8.9–10.3)
Chloride: 103 mmol/L (ref 98–111)
Creatinine, Ser: 1.36 mg/dL — ABNORMAL HIGH (ref 0.44–1.00)
GFR, Estimated: 42 mL/min — ABNORMAL LOW (ref >60)
Glucose, Bld: 100 mg/dL — ABNORMAL HIGH (ref 70–99)
Potassium: 4.3 mmol/L (ref 3.5–5.1)
Sodium: 140 mmol/L (ref 135–145)

## 2021-01-10 LAB — ECHOCARDIOGRAM COMPLETE
Area-P 1/2: 4.49 cm2
Calc EF: 37.1 %
S' Lateral: 4 cm
Single Plane A2C EF: 35.7 %
Single Plane A4C EF: 39.6 %

## 2021-01-10 LAB — TROPONIN I (HIGH SENSITIVITY)
Troponin I (High Sensitivity): 39 ng/L — ABNORMAL HIGH (ref <18)
Troponin I (High Sensitivity): 41 ng/L — ABNORMAL HIGH (ref <18)

## 2021-01-10 NOTE — ED Provider Notes (Signed)
Emergency Medicine Provider Triage Evaluation Note  [name] , a 70 y.o. female  was evaluated in triage.  Pt complains of near syncope. She was in the kitchen preparing dinner when she became lightheaded and near syncopal. She lowered herself to the floor and did not pass out or sustain trauma. On ems arrival pt was hypotensive.  Review of Systems  Positive: Lightheadedness, near syncope Negative: Chest pain  Physical Exam  BP 119/69   Pulse 69   Temp 98 F (36.7 C) (Oral)   Resp 14   SpO2 99%  Gen:   Awake, no distress   Resp:  Normal effort  MSK:   Moves extremities without difficulty  Other:  Chronically ill appearing  Medical Decision Making  Medically screening exam initiated at 4:05 PM.  Appropriate orders placed.  [name] was informed that the remainder of the evaluation will be completed by another provider, this initial triage assessment does not replace that evaluation, and the importance of remaining in the ED until their evaluation is complete.     Rayne Du 01/10/21 1606    Milagros Loll, MD 01/12/21 1210

## 2021-01-10 NOTE — ED Provider Notes (Signed)
[facility] EMERGENCY DEPARTMENT Provider Note   CSN: [id number] Arrival date & time: 01/10/21  1552     History Chief Complaint  Patient presents with   Near Syncope    [name] is a 70 y.o. female.  The history is provided by the patient.  Near Syncope This is a new problem. The current episode started 1 to 2 hours ago. The problem has been resolved. Pertinent negatives include no chest pain, no abdominal pain, no headaches and no shortness of breath. The symptoms are aggravated by standing. The symptoms are relieved by lying down and rest.   70 year old female presenting to the emergency department after a near syncopal event.  The patient does have a history of CKD, CHF, HTN, HLD, nonischemic cardiomyopathy, pulmonary hypertension.  She states that while cooking dinner and standing she suddenly became lightheaded and sweaty.  She denies any chest pain, palpitations or shortness of breath at that time.  She had to sit down with subsequent resolution of symptoms.  Her initial blood pressure with EMS was 80/60 which subsequently responded to a 3 to 50 cc IV fluid bolus with improvement to 119/69.  She had no further episodes and arrived to the emergency department afebrile, hemodynamically stable.  Past Medical History:  Diagnosis Date   Abnormal nuclear stress test 03/29/2020   Chronic combined systolic and diastolic CHF (congestive heart failure) (HCC)    CKD (chronic kidney disease), stage II    Depression    Diabetes mellitus, type 2 (HCC)    DVT (deep venous thrombosis) (HCC) 07/2011   S/P coumadin x 6 months   Dyspnea on exertion 03/19/2020   HTN (hypertension)    Hyperlipidemia    Hypertension    Hypokalemia 05/08/2012   Iron deficiency anemia    Leukocytosis 05/08/2012   Mitral regurgitation    Nonischemic cardiomyopathy (HCC) 04/21/2020   Pericardial effusion    small by echo 07/2020   Pulmonary hypertension (HCC)    Tobacco abuse  05/08/2012   Tricuspid regurgitation     Patient Active Problem List   Diagnosis Date Noted   Pericarditis 11/13/2020   Pressure injury of skin 10/27/2020   Pulmonary hypertension, unspecified (HCC) 10/27/2020   Pleural effusion 10/26/2020   Shortness of breath 10/26/2020   Pleural effusion on right    Acute on chronic combined systolic and diastolic CHF (congestive heart failure) (HCC)    Wheezing    CAD (coronary artery disease) 04/21/2020   Nonischemic cardiomyopathy (HCC) 04/21/2020   Abnormal nuclear stress test 03/29/2020   Atypical Angina pectoris (HCC) 03/19/2020   Dyspnea on exertion 03/19/2020   Diabetes mellitus due to underlying condition with unspecified complications (HCC) 03/09/2020   Acute combined systolic and diastolic heart failure (HCC) 03/09/2020   Diabetes mellitus, type 2 (HCC)    Hypertension    HTN (hypertension)    Iron deficiency anemia    Iron deficiency anemia, unspecified, symptomatic with dyspnea, dizziness, and shortness of breath with activity 05/08/2012   Hypotension 05/08/2012   Hypokalemia 05/08/2012   Hyperlipidemia 05/08/2012   Depression 05/08/2012   Leukocytosis 05/08/2012   DVT (deep venous thrombosis) (HCC) 07/2011    Past Surgical History:  Procedure Laterality Date   APPENDECTOMY     Bilateral oophrectomy     Secondary to benign cysts   colonoscopy     with polypectomy   LEFT HEART CATH AND CORONARY ANGIOGRAPHY N/A 03/29/2020   Procedure: LEFT HEART CATH AND CORONARY ANGIOGRAPHY;  Surgeon:  Marykay Lex, MD;  Location: [facility] INVASIVE CV LAB;  Service: Cardiovascular;  Laterality: N/A;   RIGHT HEART CATH N/A 10/27/2020   Procedure: RIGHT HEART CATH;  Surgeon: Dolores Patty, MD;  Location: [facility] INVASIVE CV LAB;  Service: Cardiovascular;  Laterality: N/A;   TONSILLECTOMY       OB History   No obstetric history on file.     Family History  Problem Relation Age of Onset   Uterine cancer Mother    Breast cancer Mother     COPD Mother    Alcoholism Father    Liver disease Father    Diabetes Maternal Grandmother    Stroke Maternal Grandfather    CAD Paternal Grandmother    Diabetes Paternal Grandmother    Stroke Paternal Grandfather     Social History   Tobacco Use   Smoking status: Former    Packs/day: 0.50    Years: 40.00    Pack years: 20.00    Types: Cigarettes    Quit date: 07/2020    Years since quitting: 0.4   Smokeless tobacco: Never  Substance Use Topics   Alcohol use: Yes    Alcohol/week: 15.0 standard drinks    Types: 15 Cans of beer per week   Drug use: No    Home Medications Prior to Admission medications   Medication Sig Start Date End Date Taking? Authorizing Provider  spironolactone (ALDACTONE) 25 MG tablet TAKE 1 TABLET (25 MG TOTAL) BY MOUTH DAILY. 01/11/21   Revankar, Aundra Dubin, MD  Aflibercept (EYLEA) 2 MG/0.05ML SOLN 2 mg by Intravitreal route every 8 (eight) weeks. 10/29/20   Lonia Blood, MD  aspirin 81 MG EC tablet Take 81 mg by mouth every evening.     [provider]  carboxymethylcellulose (REFRESH PLUS) 0.5 % SOLN Place 1 drop into both eyes 3 (three) times daily as needed for dry eyes (dry eye).    [provider]  colchicine 0.6 MG tablet Take 1 tablet (0.6 mg total) by mouth daily. 11/16/20   Cyndi Bender, NP  empagliflozin (JARDIANCE) 10 MG TABS tablet Take 1 tablet (10 mg total) by mouth daily. 10/30/20   Lonia Blood, MD  ferrous sulfate 325 (65 FE) MG tablet Take 325 mg by mouth 4 (four) times a week.    [provider]  metFORMIN (GLUCOPHAGE-XR) 500 MG 24 hr tablet Take 500 mg by mouth every evening.  04/03/19   [provider]  metoprolol succinate (TOPROL-XL) 25 MG 24 hr tablet Take 1 tablet (25 mg total) by mouth daily. Take with or immediately following a meal. 12/27/20   Revankar, Aundra Dubin, MD  Multiple Vitamins-Minerals (PRESERVISION AREDS 2+MULTI VIT) CAPS Take 1 Dose by mouth daily.    [provider]   nitroGLYCERIN (NITROSTAT) 0.4 MG SL tablet Place 0.4 mg under the tongue every 5 (five) minutes as needed for chest pain.    [provider]  sacubitril-valsartan (ENTRESTO) 24-26 MG Take 1 tablet by mouth 2 (two) times daily.    [provider]  simvastatin (ZOCOR) 20 MG tablet Take 20 mg by mouth every evening.     [provider]  torsemide (DEMADEX) 20 MG tablet Take 1.5 tablets (30 mg total) by mouth daily. 12/27/20   Revankar, Aundra Dubin, MD    Allergies    Patient has no known allergies.  Review of Systems   Review of Systems  Constitutional:  Positive for diaphoresis. Negative for chills and fever.  HENT:  Negative for ear pain and sore throat.   Eyes:  Negative for pain and visual disturbance.  Respiratory:  Negative for cough and shortness of breath.   Cardiovascular:  Positive for near-syncope. Negative for chest pain and palpitations.  Gastrointestinal:  Negative for abdominal pain and vomiting.  Genitourinary:  Negative for dysuria and hematuria.  Musculoskeletal:  Negative for arthralgias and back pain.  Skin:  Negative for color change and rash.  Neurological:  Positive for light-headedness. Negative for seizures, syncope and headaches.  All other systems reviewed and are negative.  Physical Exam Updated Vital Signs BP 125/67 (BP Location: Left Arm)   Pulse 66   Temp 97.9 F (36.6 C) (Oral)   Resp 18   Ht 5\' 4"  (1.626 m)   Wt 50.3 kg   SpO2 99%   BMI 19.05 kg/m   Physical Exam Vitals and nursing note reviewed.  Constitutional:      General: She is not in acute distress.    Appearance: She is well-developed. She is not ill-appearing or diaphoretic.  HENT:     Head: Normocephalic and atraumatic.  Eyes:     Conjunctiva/sclera: Conjunctivae normal.  Cardiovascular:     Rate and Rhythm: Normal rate and regular rhythm.     Pulses: Normal pulses.     Heart sounds: No murmur heard. Pulmonary:     Effort: Pulmonary effort is normal. No  respiratory distress.     Breath sounds: Normal breath sounds.  Abdominal:     Palpations: Abdomen is soft.     Tenderness: There is no abdominal tenderness.  Musculoskeletal:     Cervical back: Normal range of motion and neck supple.  Skin:    General: Skin is warm and dry.  Neurological:     General: No focal deficit present.     Mental Status: She is alert and oriented to person, place, and time.     Cranial Nerves: No cranial nerve deficit.     Sensory: No sensory deficit.     Motor: No weakness.    ED Results / Procedures / Treatments   Labs (all labs ordered are listed, but only abnormal results are displayed) Labs Reviewed  BASIC METABOLIC PANEL - Abnormal; Notable for the following components:      Result Value   Glucose, Bld 100 (*)    BUN 43 (*)    Creatinine, Ser 1.36 (*)    GFR, Estimated 42 (*)    All other components within normal limits  CBC - Abnormal; Notable for the following components:   Hemoglobin 15.2 (*)    HCT 46.2 (*)    RDW 17.3 (*)    All other components within normal limits  TROPONIN I (HIGH SENSITIVITY) - Abnormal; Notable for the following components:   Troponin I (High Sensitivity) 39 (*)    All other components within normal limits  TROPONIN I (HIGH SENSITIVITY) - Abnormal; Notable for the following components:   Troponin I (High Sensitivity) 41 (*)    All other components within normal limits    EKG EKG Interpretation  Date/Time:  Monday January 10 2021 16:00:55 EDT Ventricular Rate:  70 PR Interval:  150 QRS Duration: 96 QT Interval:  420 QTC Calculation: 453 R Axis:   21 Text Interpretation: Sinus rhythm with Fusion complexes Septal infarct , age undetermined ST & T wave abnormality, consider inferior ischemia ST & T wave abnormality, consider anterolateral ischemia Abnormal ECG no stemi Confirmed by 01-07-1991 (Marianna Fuss) on 01/10/2021 4:06:54 PM  Radiology DG Chest 1 View  Result Date: 01/10/2021 CLINICAL DATA:  Near  syncope EXAM: CHEST  1 VIEW COMPARISON:  11/13/2020 FINDINGS: Cardiomegaly. Both lungs are clear. The visualized skeletal structures are unremarkable. IMPRESSION: Cardiomegaly without acute abnormality of the lungs in AP portable projection. Electronically Signed   By: Lauralyn Primes M.D.   On: 01/10/2021 17:40    Procedures Procedures   Medications Ordered in ED Medications - No data to display  ED Course  I have reviewed the triage vital signs and the nursing notes.  Pertinent labs & imaging results that were available during my care of the patient were reviewed by me and considered in my medical decision making (see chart for details).  Clinical Course as of 01/12/21 1409  Wed Jan 12, 2021  1404 DG Chest 1 View [JL]    Clinical Course User Index [JL] Ernie Avena, MD   MDM Rules/Calculators/A&P                           70 year old female presenting to the emergency department after a near syncopal event.  The patient does have a history of CKD, CHF, HTN, HLD, nonischemic cardiomyopathy, pulmonary hypertension.  She states that while cooking dinner and standing she suddenly became lightheaded and sweaty.  She denies any chest pain, palpitations or shortness of breath at that time.  She had to sit down with subsequent resolution of symptoms.  Her initial blood pressure with EMS was 80/60 which subsequently responded to a 3 to 50 cc IV fluid bolus with improvement to 119/69.  She had no further episodes and arrived to the emergency department afebrile, hemodynamically stable.  Patient's EKG was significant for nonspecific ST-T changes.  She has a history of cardiomyopathy with an ejection fraction on echocardiogram of 10/26/2020 of 25 to 30%.  She incidentally had a repeat echocardiogram this morning which showed improvement to 35 to 40% with no pericardial effusion.  She currently is asymptomatic.  She has had no chest pain or heart palpitations.  No full episode of syncope.  Only the  single episode of lightheadedness and diaphoresis with positive response to fluid challenge.  Symptoms are more consistent with vasovagal syncope versus cardiogenic etiology of the patient's syncope.  Low suspicion for ACS at this time as the patient has asymptomatic.  Chest x-ray revealed cardiomegaly without evidence of pulmonary vascular congestion.  The patient has no JVD and no lower extremity edema.  She has no rales on physical exam.  Low suspicion for CHF exacerbation at this time.  Patient is afebrile, hemodynamically stable, saturating well on room air.  Troponins in the ED were elevated to 39 and 41.  She has a mildly elevated BUN of 43 and a creatinine of 1.36, no electrolyte abnormality.  She recently followed with cardiology on 8/1 with noted significant improvement in her PND, orthopnea and lower extremity edema after torsemide therapy.  During her last hospitalization she also had slight elevations in her troponin, thought to be slight demand ischemia and was also noted to have concern for pericarditis.  She started on ibuprofen, colchicine.  Symptoms were improved on recheck.  She had no pericardial effusion on echocardiogram today.  I have low suspicion for cardiac tamponade or pericarditis today.  Low suspicion for CHF exacerbation.  Symptoms may be due to diuretic use and hypotension however the patient has been normotensive here.   I had a long discussion with the patient bedside about  admission to the hospital versus discharge home.  After discussion of the risks and benefits of admission for troponin trending of the patient's troponins, monitoring of her blood pressure and heart on cardiac telemetry versus discharge, the patient preferred discharge home with outpatient cardiology follow-up.  I explained the differential diagnosis with the patient.  Given her echocardiogram this morning that showed improvement in her cardiac function, apparent euvolemia on exam with no evidence of hypotension  or volume overload, I do not think the patient would significantly benefit from an inpatient admission other than continued observation.  The patient prefers to continue to monitor her symptoms at home and plans to return to the emergency department for any recurrence of her symptoms.  She plans to follow-up with cardiology outpatient and is comfortable with that plan.  Encouraged continued hydration in the setting of her diuretic use.  Final Clinical Impression(s) / ED Diagnoses Final diagnoses:  Near syncope  Vasovagal syncope    Rx / DC Orders ED Discharge Orders     None        Ernie Avena, MD 01/12/21 1409

## 2021-01-10 NOTE — ED Triage Notes (Signed)
Pt from home after near syncopal event-was cooking dinner and became hot and dizzy. Lowered herself to the ground and did not pass out or fall. Initial BP 80/60. Given 350 cc bolus by EMS with improvement to 119/69 on arrival to ED.

## 2021-01-11 NOTE — ED Notes (Signed)
Patient verbalizes understanding of discharge instructions. Follow-up care reviewed. Opportunity for questioning and answers were provided. Armband removed by staff, pt discharged from ED ambulatory. Pt waiting in lobby for husband.

## 2021-01-18 ENCOUNTER — Ambulatory Visit: Payer: Medicare Other | Admitting: Cardiology

## 2021-01-31 ENCOUNTER — Ambulatory Visit: Payer: Medicare Other | Admitting: Cardiology

## 2021-03-08 ENCOUNTER — Other Ambulatory Visit: Payer: Self-pay

## 2021-03-08 DIAGNOSIS — N182 Chronic kidney disease, stage 2 (mild): Secondary | ICD-10-CM | POA: Insufficient documentation

## 2021-03-08 DIAGNOSIS — I340 Nonrheumatic mitral (valve) insufficiency: Secondary | ICD-10-CM | POA: Insufficient documentation

## 2021-03-08 DIAGNOSIS — I3139 Other pericardial effusion (noninflammatory): Secondary | ICD-10-CM | POA: Insufficient documentation

## 2021-03-14 ENCOUNTER — Encounter: Payer: Self-pay | Admitting: Cardiology

## 2021-03-14 ENCOUNTER — Other Ambulatory Visit: Payer: Self-pay

## 2021-03-14 ENCOUNTER — Ambulatory Visit (INDEPENDENT_AMBULATORY_CARE_PROVIDER_SITE_OTHER): Payer: Medicare Other | Admitting: Cardiology

## 2021-03-14 VITALS — BP 114/64 | HR 72 | Ht 64.0 in | Wt 121.2 lb

## 2021-03-14 DIAGNOSIS — I5043 Acute on chronic combined systolic (congestive) and diastolic (congestive) heart failure: Secondary | ICD-10-CM | POA: Diagnosis not present

## 2021-03-14 DIAGNOSIS — I10 Essential (primary) hypertension: Secondary | ICD-10-CM | POA: Diagnosis not present

## 2021-03-14 DIAGNOSIS — I2510 Atherosclerotic heart disease of native coronary artery without angina pectoris: Secondary | ICD-10-CM

## 2021-03-14 DIAGNOSIS — E782 Mixed hyperlipidemia: Secondary | ICD-10-CM | POA: Diagnosis not present

## 2021-03-14 DIAGNOSIS — E088 Diabetes mellitus due to underlying condition with unspecified complications: Secondary | ICD-10-CM

## 2021-03-14 NOTE — Patient Instructions (Signed)
Medication Instructions:  Your physician recommends that you continue on your current medications as directed. Please refer to the Current Medication list given to you today.  *If you need a refill on your cardiac medications before your next appointment, please call your pharmacy*   Lab Work: Your physician recommends that you have labs done in the office today. Your test included  basic metabolic panel, complete blood count, TSH, liver function and BNP.  If you have labs (blood work) drawn today and your tests are completely normal, you will receive your results only by: MyChart Message (if you have MyChart) OR A paper copy in the mail If you have any lab test that is abnormal or we need to change your treatment, we will call you to review the results.   Testing/Procedures: None ordered   Follow-Up: At [facility], you and your health needs are our priority.  As part of our continuing mission to provide you with exceptional heart care, we have created designated Provider Care Teams.  These Care Teams include your primary Cardiologist (physician) and Advanced Practice Providers (APPs -  Physician Assistants and Nurse Practitioners) who all work together to provide you with the care you need, when you need it.  We recommend signing up for the patient portal called "MyChart".  Sign up information is provided on this After Visit Summary.  MyChart is used to connect with patients for Virtual Visits (Telemedicine).  Patients are able to view lab/test results, encounter notes, upcoming appointments, etc.  Non-urgent messages can be sent to your provider as well.   To learn more about what you can do with MyChart, go to [email or web address].    Your next appointment:   3 month(s)  The format for your next appointment:   In Person  Provider:   Belva Crome, MD   Other Instructions NA

## 2021-03-14 NOTE — Progress Notes (Signed)
Cardiology Office Note:    Date:  03/14/2021   ID:  [name], DOB [birth year], MRN [id number]  PCP:  Enid Skeens., MD  Cardiologist:  Jenean Lindau, MD   Referring MD: Enid Skeens., MD    ASSESSMENT:    1. Acute on chronic combined systolic and diastolic CHF (congestive heart failure) ([place])   2. Coronary artery disease involving native coronary artery of native heart without angina pectoris   3. Primary hypertension   4. Mixed hyperlipidemia   5. Diabetes mellitus due to underlying condition with unspecified complications ([place])    PLAN:    In order of problems listed above:  Coronary artery disease: Secondary prevention stressed with the patient.  Importance of compliance with diet medication stressed and she vocalized understanding.  She was advised to walk on a regular basis to the best of her ability.  She promises to do so. Cardiomyopathy: Ejection fraction is significantly better.  Patient is on multiple medications and guideline directed medical therapy and we will check her blood work today including Chem-7.  Salt intake issues and diet was emphasized.  Daily weight checks were also explained again and she is doing well and very meticulous about the follow-up on this. Essential hypertension: Blood pressure stable and diet was emphasized.  Lifestyle modification urged. Mixed dyslipidemia and diabetes mellitus: On statin therapy.  Doing well with diet.   Medication Adjustments/Labs and Tests Ordered: Current medicines are reviewed at length with the patient today.  Concerns regarding medicines are outlined above.  No orders of the defined types were placed in this encounter.  No orders of the defined types were placed in this encounter.    No chief complaint on file.    History of Present Illness:    [name] is a 71 y.o. female.  Patient has past medical history of advanced cardiomyopathy, essential hypertension and dyslipidemia.  She  has history of diabetes mellitus.  She denies any problems at this time and takes care of activities of daily living.  No chest pain orthopnea or PND.  She feels much better and is walking on a regular basis.  She is here for routine follow-up.  At the time of my evaluation, the patient is alert awake oriented and in no distress.  Past Medical History:  Diagnosis Date   Abnormal nuclear stress test 03/29/2020   Acute combined systolic and diastolic heart failure ([place]) 03/09/2020   Acute on chronic combined systolic and diastolic CHF (congestive heart failure) (H[facility])    Atypical Angina pectoris ([place]) 03/19/2020   CAD (coronary artery disease) 04/21/2020   CKD (chronic kidney disease), stage II    Depression    Diabetes mellitus due to underlying condition with unspecified complications ([place]) 123XX123   Diabetes mellitus, type 2 (HCC)    DVT (deep venous thrombosis) ([place]) 07/2011   S/P coumadin x 6 months   Dyspnea on exertion 03/19/2020   HTN (hypertension)    Hyperlipidemia    Hypertension    Hypokalemia 05/08/2012   Hypotension 05/08/2012   Iron deficiency anemia    Iron deficiency anemia, unspecified, symptomatic with dyspnea, dizziness, and shortness of breath with activity 05/08/2012   Leukocytosis 05/08/2012   Mitral regurgitation    Nonischemic cardiomyopathy ([place]) 04/21/2020   Pericardial effusion    small by echo 07/2020   Pericarditis 11/13/2020   Pleural effusion 10/26/2020   Pleural effusion on right    Pressure injury of skin 10/27/2020   Pulmonary hypertension,  unspecified (HCC) 10/27/2020   Shortness of breath 10/26/2020   Tricuspid regurgitation    Wheezing    Recently put on albuterol, no history of asthma    Past Surgical History:  Procedure Laterality Date   APPENDECTOMY     Bilateral oophrectomy     Secondary to benign cysts   colonoscopy     with polypectomy   LEFT HEART CATH AND CORONARY ANGIOGRAPHY N/A 03/29/2020   Procedure: LEFT HEART CATH AND CORONARY  ANGIOGRAPHY;  Surgeon: Marykay Lex, MD;  Location[facility] INVASIVE CV LAB;  Service: Cardiovascular;  Laterality: N/A;   RIGHT HEART CATH N/A 10/27/2020   Procedure: RIGHT HEART CATH;  Surgeon: Dolores Patty, MD;  Location: [facility] INVASIVE CV LAB;  Service: Cardiovascular;  Laterality: N/A;   TONSILLECTOMY      Current Medications: Current Meds  Medication Sig   Aflibercept (EYLEA) 2 MG/0.05ML SOLN 2 mg by Intravitreal route as directed. Every 10 weeks   aspirin 81 MG EC tablet Take 81 mg by mouth every evening.    carboxymethylcellulose (REFRESH PLUS) 0.5 % SOLN Place 1 drop into both eyes 3 (three) times daily as needed for dry eyes (dry eye).   empagliflozin (JARDIANCE) 10 MG TABS tablet Take 1 tablet (10 mg total) by mouth daily.   ferrous sulfate 325 (65 FE) MG tablet Take 325 mg by mouth 4 (four) times a week.   metFORMIN (GLUCOPHAGE-XR) 500 MG 24 hr tablet Take 500 mg by mouth every evening.    metoprolol succinate (TOPROL-XL) 25 MG 24 hr tablet Take 1 tablet (25 mg total) by mouth daily. Take with or immediately following a meal.   Multiple Vitamins-Minerals (PRESERVISION AREDS 2+MULTI VIT) CAPS Take 1 Dose by mouth daily.   nitroGLYCERIN (NITROSTAT) 0.4 MG SL tablet Place 0.4 mg under the tongue every 5 (five) minutes as needed for chest pain.   sacubitril-valsartan (ENTRESTO) 24-26 MG Take 1 tablet by mouth 2 (two) times daily.   simvastatin (ZOCOR) 20 MG tablet Take 20 mg by mouth every evening.    spironolactone (ALDACTONE) 25 MG tablet TAKE 1 TABLET (25 MG TOTAL) BY MOUTH DAILY.   torsemide (DEMADEX) 20 MG tablet Take 1.5 tablets (30 mg total) by mouth daily.     Allergies:   Patient has no known allergies.   Social History   Socioeconomic History   Marital status: Married    Spouse name: Kawanna Christley   Number of children: 2   Years of education: Not on file   Highest education level: Not on file  Occupational History   Occupation: Production manager for a company    Employer:  [employer]  Tobacco Use   Smoking status: Former    Packs/day: 0.50    Years: 40.00    Pack years: 20.00    Types: Cigarettes    Quit date: 07/2020    Years since quitting: 0.6   Smokeless tobacco: Never  Substance and Sexual Activity   Alcohol use: Yes    Alcohol/week: 15.0 standard drinks    Types: 15 Cans of beer per week   Drug use: No   Sexual activity: Never  Other Topics Concern   Not on file  Social History Narrative   Married.  Lives in husband here in [place].  Normally independent of ADLs.   Social Determinants of Health   Financial Resource Strain: Not on file  Food Insecurity: Not on file  Transportation Needs: Not on file  Physical Activity: Not on file  Stress: Not on file  Social Connections: Not on file     Family History: The patient's family history includes Alcoholism in her father; Breast cancer in her mother; CAD in her paternal grandmother; COPD in her mother; Diabetes in her maternal grandmother and paternal grandmother; Liver disease in her father; Stroke in her maternal grandfather and paternal grandfather; Uterine cancer in her mother.  ROS:   Please see the history of present illness.    All other systems reviewed and are negative.  EKGs/Labs/Other Studies Reviewed:    The following studies were reviewed today: I discussed my findings with the patient at length including echo report.   Recent Labs: 10/26/2020: ALT 17; TSH 1.443 11/13/2020: B Natriuretic Peptide 2,124.0 11/14/2020: Magnesium 2.4 01/10/2021: BUN 43; Creatinine, Ser 1.36; Hemoglobin 15.2; Platelets 170; Potassium 4.3; Sodium 140  Recent Lipid Panel No results found for: CHOL, TRIG, HDL, CHOLHDL, VLDL, LDLCALC, LDLDIRECT  Physical Exam:    VS:  BP 114/64   Pulse 72   Ht 5\' 4"  (1.626 m)   Wt 121 lb 3.2 oz (55 kg)   SpO2 95%   BMI 20.80 kg/m     Wt Readings from Last 3 Encounters:  03/14/21 121 lb 3.2 oz (55 kg)  01/10/21 111 lb (50.3 kg)  12/02/20 111 lb (50.3  kg)     GEN: Patient is in no acute distress HEENT: Normal NECK: No JVD; No carotid bruits LYMPHATICS: No lymphadenopathy CARDIAC: Hear sounds regular, 2/6 systolic murmur at the apex. RESPIRATORY:  Clear to auscultation without rales, wheezing or rhonchi  ABDOMEN: Soft, non-tender, non-distended MUSCULOSKELETAL:  No edema; No deformity  SKIN: Warm and dry NEUROLOGIC:  Alert and oriented x 3 PSYCHIATRIC:  Normal affect   Signed, Jenean Lindau, MD  03/14/2021 2:28 PM    [place]

## 2021-03-15 LAB — CBC WITH DIFFERENTIAL/PLATELET
Basophils Absolute: 0.1 10*3/uL (ref 0.0–0.2)
Basos: 1 %
EOS (ABSOLUTE): 0.2 10*3/uL (ref 0.0–0.4)
Eos: 4 %
Hematocrit: 48.9 % — ABNORMAL HIGH (ref 34.0–46.6)
Hemoglobin: 16.4 g/dL — ABNORMAL HIGH (ref 11.1–15.9)
Immature Grans (Abs): 0 10*3/uL (ref 0.0–0.1)
Immature Granulocytes: 0 %
Lymphocytes Absolute: 1.4 10*3/uL (ref 0.7–3.1)
Lymphs: 21 %
MCH: 33.3 pg — ABNORMAL HIGH (ref 26.6–33.0)
MCHC: 33.5 g/dL (ref 31.5–35.7)
MCV: 99 fL — ABNORMAL HIGH (ref 79–97)
Monocytes Absolute: 0.6 10*3/uL (ref 0.1–0.9)
Monocytes: 8 %
Neutrophils Absolute: 4.6 10*3/uL (ref 1.4–7.0)
Neutrophils: 66 %
Platelets: 172 10*3/uL (ref 150–450)
RBC: 4.93 x10E6/uL (ref 3.77–5.28)
RDW: 12.3 % (ref 11.7–15.4)
WBC: 6.8 10*3/uL (ref 3.4–10.8)

## 2021-03-15 LAB — HEPATIC FUNCTION PANEL
ALT: 17 IU/L (ref 0–32)
AST: 21 IU/L (ref 0–40)
Albumin: 4.7 g/dL (ref 3.8–4.8)
Alkaline Phosphatase: 103 IU/L (ref 44–121)
Bilirubin Total: 0.5 mg/dL (ref 0.0–1.2)
Bilirubin, Direct: 0.17 mg/dL (ref 0.00–0.40)
Total Protein: 6.7 g/dL (ref 6.0–8.5)

## 2021-03-15 LAB — TSH: TSH: 0.892 u[IU]/mL (ref 0.450–4.500)

## 2021-03-15 LAB — BASIC METABOLIC PANEL
BUN/Creatinine Ratio: 24 (ref 12–28)
BUN: 26 mg/dL (ref 8–27)
CO2: 22 mmol/L (ref 20–29)
Calcium: 9.8 mg/dL (ref 8.7–10.3)
Chloride: 102 mmol/L (ref 96–106)
Creatinine, Ser: 1.07 mg/dL — ABNORMAL HIGH (ref 0.57–1.00)
Glucose: 83 mg/dL (ref 70–99)
Potassium: 4.1 mmol/L (ref 3.5–5.2)
Sodium: 143 mmol/L (ref 134–144)
eGFR: 56 mL/min/{1.73_m2} — ABNORMAL LOW (ref >59)

## 2021-03-15 LAB — PRO B NATRIURETIC PEPTIDE: NT-Pro BNP: 848 pg/mL — ABNORMAL HIGH (ref 0–301)

## 2021-04-13 ENCOUNTER — Other Ambulatory Visit: Payer: Self-pay | Admitting: Cardiology

## 2021-06-28 ENCOUNTER — Other Ambulatory Visit: Payer: Self-pay

## 2021-06-28 ENCOUNTER — Encounter: Payer: Self-pay | Admitting: Cardiology

## 2021-06-28 ENCOUNTER — Ambulatory Visit (INDEPENDENT_AMBULATORY_CARE_PROVIDER_SITE_OTHER): Payer: Medicare Other | Admitting: Cardiology

## 2021-06-28 VITALS — BP 118/68 | HR 66 | Ht 64.0 in | Wt 126.0 lb

## 2021-06-28 DIAGNOSIS — I428 Other cardiomyopathies: Secondary | ICD-10-CM

## 2021-06-28 DIAGNOSIS — N182 Chronic kidney disease, stage 2 (mild): Secondary | ICD-10-CM

## 2021-06-28 DIAGNOSIS — I10 Essential (primary) hypertension: Secondary | ICD-10-CM | POA: Diagnosis not present

## 2021-06-28 DIAGNOSIS — E088 Diabetes mellitus due to underlying condition with unspecified complications: Secondary | ICD-10-CM

## 2021-06-28 DIAGNOSIS — E782 Mixed hyperlipidemia: Secondary | ICD-10-CM

## 2021-06-28 DIAGNOSIS — Z1321 Encounter for screening for nutritional disorder: Secondary | ICD-10-CM

## 2021-06-28 NOTE — Progress Notes (Signed)
?Cardiology Office Note:   ? ?Date:  06/28/2021  ? ?ID:  [name], DOB 1950/06/21, MRN [id number] ? ?PCP:  Enid Skeens., MD  ?Cardiologist:  Jenean Lindau, MD  ? ?Referring MD: Enid Skeens., MD  ? ? ?ASSESSMENT:   ? ?1. Nonischemic cardiomyopathy ([place])   ?2. Primary hypertension   ?3. Mixed hyperlipidemia   ?4. Diabetes mellitus due to underlying condition with unspecified complications ([place])   ? ?PLAN:   ? ?In order of problems listed above: ? ?Coronary artery disease: Secondary prevention stressed to the patient.  Importance of compliance with diet medication stressed and she vocalized understanding. ?Congestive heart failure with history of cardiomyopathy.  Now preserved ejection fraction.  I discussed this with the patient at extensive length.  She checks her weight on a weekly basis.  Diet and salt intake issues discussed she understands. ?Essential hypertension: Blood pressure is stable and diet was emphasized.  Lifestyle modification urged. ?Mixed dyslipidemia and diabetes mellitus: I discussed this with her.  We will do blood work as she is fasting and I will also do vitamin D level.  Her hemoglobin A1c recently was mildly elevated.  And she mentioned to me the numbers.  This is followed by primary care. ?Patient will be seen in follow-up appointment in 6 months or earlier if the patient has any concerns ? ? ? ?Medication Adjustments/Labs and Tests Ordered: ?Current medicines are reviewed at length with the patient today.  Concerns regarding medicines are outlined above.  ?No orders of the defined types were placed in this encounter. ? ?No orders of the defined types were placed in this encounter. ? ? ? ?No chief complaint on file. ?  ? ?History of Present Illness:   ? ?[name] is a 71 y.o. female.  Patient has past medical history of essential hypertension dyslipidemia history of advanced cardiomyopathy with congestive heart failure which has resolved now.  She denies any  problems at this time and takes care of activities of daily living.  No chest pain orthopnea or PND.  She is a diabetic.  At the time of my evaluation, the patient is alert awake oriented and in no distress. ? ?Past Medical History:  ?Diagnosis Date  ? Abnormal nuclear stress test 03/29/2020  ? Acute combined systolic and diastolic heart failure ([place]) 03/09/2020  ? Acute on chronic combined systolic and diastolic CHF (congestive heart failure) ([place])   ? Atypical Angina pectoris ([place]) 03/19/2020  ? CAD (coronary artery disease) 04/21/2020  ? CKD (chronic kidney disease), stage II   ? Depression   ? Diabetes mellitus due to underlying condition with unspecified complications ([place]) 123XX123  ? Diabetes mellitus, type 2 ([place])   ? DVT (deep venous thrombosis) ([place]) 07/2011  ? S/P coumadin x 6 months  ? Dyspnea on exertion 03/19/2020  ? HTN (hypertension)   ? Hyperlipidemia   ? Hypertension   ? Hypokalemia 05/08/2012  ? Hypotension 05/08/2012  ? Iron deficiency anemia   ? Iron deficiency anemia, unspecified, symptomatic with dyspnea, dizziness, and shortness of breath with activity 05/08/2012  ? Leukocytosis 05/08/2012  ? Mitral regurgitation   ? Nonischemic cardiomyopathy ([place]) 04/21/2020  ? Pericardial effusion   ? small by echo 07/2020  ? Pericarditis 11/13/2020  ? Pleural effusion 10/26/2020  ? Pleural effusion on right   ? Pressure injury of skin 10/27/2020  ? Pulmonary hypertension, unspecified ([place]) 10/27/2020  ? Shortness of breath 10/26/2020  ? Tricuspid regurgitation   ? Wheezing   ?  Recently put on albuterol, no history of asthma  ? ? ?Past Surgical History:  ?Procedure Laterality Date  ? APPENDECTOMY    ? Bilateral oophrectomy    ? Secondary to benign cysts  ? colonoscopy    ? with polypectomy  ? LEFT HEART CATH AND CORONARY ANGIOGRAPHY N/A 03/29/2020  ? Procedure: LEFT HEART CATH AND CORONARY ANGIOGRAPHY;  Surgeon: Leonie Man, MD;  Location: [place] CV LAB;  Service: Cardiovascular;  Laterality: N/A;  ?  RIGHT HEART CATH N/A 10/27/2020  ? Procedure: RIGHT HEART CATH;  Surgeon: Jolaine Artist, MD;  Location: [place] CV LAB;  Service: Cardiovascular;  Laterality: N/A;  ? TONSILLECTOMY    ? ? ?Current Medications: ?Current Meds  ?Medication Sig  ? Aflibercept (EYLEA) 2 MG/0.05ML SOLN 2 mg by Intravitreal route as directed. Every 10 weeks  ? aspirin 81 MG EC tablet Take 81 mg by mouth every evening.   ? carboxymethylcellulose (REFRESH PLUS) 0.5 % SOLN Place 1 drop into both eyes 3 (three) times daily as needed for dry eyes (dry eye).  ? empagliflozin (JARDIANCE) 10 MG TABS tablet Take 1 tablet (10 mg total) by mouth daily.  ? ENTRESTO 24-26 MG TAKE 1 TABLET BY MOUTH TWICE A DAY  ? ferrous sulfate 325 (65 FE) MG tablet Take 325 mg by mouth 4 (four) times a week.  ? metFORMIN (GLUCOPHAGE-XR) 500 MG 24 hr tablet Take 500 mg by mouth every evening.   ? metoprolol succinate (TOPROL-XL) 25 MG 24 hr tablet Take 1 tablet (25 mg total) by mouth daily. Take with or immediately following a meal.  ? Multiple Vitamins-Minerals (PRESERVISION AREDS 2+MULTI VIT) CAPS Take 1 Dose by mouth daily.  ? nitroGLYCERIN (NITROSTAT) 0.4 MG SL tablet Place 0.4 mg under the tongue every 5 (five) minutes as needed for chest pain.  ? simvastatin (ZOCOR) 20 MG tablet Take 20 mg by mouth every evening.   ? spironolactone (ALDACTONE) 25 MG tablet TAKE 1 TABLET (25 MG TOTAL) BY MOUTH DAILY.  ? torsemide (DEMADEX) 20 MG tablet Take 1.5 tablets (30 mg total) by mouth daily.  ?  ? ?Allergies:   Patient has no known allergies.  ? ?Social History  ? ?Socioeconomic History  ? Marital status: Married  ?  Spouse name: Charlierose Stagnitta  ? Number of children: 2  ? Years of education: Not on file  ? Highest education level: Not on file  ?Occupational History  ? Occupation: Banker for a company  ?  Employer: [employer]  ?Tobacco Use  ? Smoking status: Former  ?  Packs/day: 0.50  ?  Years: 40.00  ?  Pack years: 20.00  ?  Types: Cigarettes  ?  Quit date: 07/2020   ?  Years since quitting: 0.9  ? Smokeless tobacco: Never  ?Substance and Sexual Activity  ? Alcohol use: Yes  ?  Alcohol/week: 15.0 standard drinks  ?  Types: 15 Cans of beer per week  ? Drug use: No  ? Sexual activity: Never  ?Other Topics Concern  ? Not on file  ?Social History Narrative  ? Married.  Lives in husband here in [place].  Normally independent of ADLs.  ? ?Social Determinants of Health  ? ?Financial Resource Strain: Not on file  ?Food Insecurity: Not on file  ?Transportation Needs: Not on file  ?Physical Activity: Not on file  ?Stress: Not on file  ?Social Connections: Not on file  ?  ? ?Family History: ?The patient's family history includes Alcoholism  in her father; Breast cancer in her mother; CAD in her paternal grandmother; COPD in her mother; Diabetes in her maternal grandmother and paternal grandmother; Liver disease in her father; Stroke in her maternal grandfather and paternal grandfather; Uterine cancer in her mother. ? ?ROS:   ?Please see the history of present illness.    ?All other systems reviewed and are negative. ? ?EKGs/Labs/Other Studies Reviewed:   ? ?The following studies were reviewed today: ?I discussed my findings with the patient at extensive length ? ? ?Recent Labs: ?11/13/2020: B Natriuretic Peptide 2,124.0 ?11/14/2020: Magnesium 2.4 ?03/14/2021: ALT 17; BUN 26; Creatinine, Ser 1.07; Hemoglobin 16.4; NT-Pro BNP 848; Platelets 172; Potassium 4.1; Sodium 143; TSH 0.892  ?Recent Lipid Panel ?No results found for: CHOL, TRIG, HDL, CHOLHDL, VLDL, LDLCALC, LDLDIRECT ? ?Physical Exam:   ? ?VS:  BP 118/68   Pulse 66   Ht 5\' 4"  (1.626 m)   Wt 126 lb (57.2 kg)   SpO2 96%   BMI 21.63 kg/m?    ? ?Wt Readings from Last 3 Encounters:  ?06/28/21 126 lb (57.2 kg)  ?03/14/21 121 lb 3.2 oz (55 kg)  ?01/10/21 111 lb (50.3 kg)  ?  ? ?GEN: Patient is in no acute distress ?HEENT: Normal ?NECK: No JVD; No carotid bruits ?LYMPHATICS: No lymphadenopathy ?CARDIAC: Hear sounds regular, 2/6  systolic murmur at the apex. ?RESPIRATORY:  Clear to auscultation without rales, wheezing or rhonchi  ?ABDOMEN: Soft, non-tender, non-distended ?MUSCULOSKELETAL:  No edema; No deformity  ?SKIN: Warm and dry ?NEUROLOGIC:

## 2021-06-28 NOTE — Patient Instructions (Addendum)

## 2021-06-29 ENCOUNTER — Telehealth: Payer: Self-pay

## 2021-06-29 DIAGNOSIS — I5041 Acute combined systolic (congestive) and diastolic (congestive) heart failure: Secondary | ICD-10-CM

## 2021-06-29 DIAGNOSIS — I5043 Acute on chronic combined systolic (congestive) and diastolic (congestive) heart failure: Secondary | ICD-10-CM

## 2021-06-29 DIAGNOSIS — E088 Diabetes mellitus due to underlying condition with unspecified complications: Secondary | ICD-10-CM

## 2021-06-29 DIAGNOSIS — I10 Essential (primary) hypertension: Secondary | ICD-10-CM

## 2021-06-29 DIAGNOSIS — E782 Mixed hyperlipidemia: Secondary | ICD-10-CM

## 2021-06-29 LAB — CBC WITH DIFFERENTIAL/PLATELET
Basophils Absolute: 0.1 10*3/uL (ref 0.0–0.2)
Basos: 1 %
EOS (ABSOLUTE): 0.3 10*3/uL (ref 0.0–0.4)
Eos: 5 %
Hematocrit: 48.1 % — ABNORMAL HIGH (ref 34.0–46.6)
Hemoglobin: 16.4 g/dL — ABNORMAL HIGH (ref 11.1–15.9)
Immature Grans (Abs): 0 10*3/uL (ref 0.0–0.1)
Immature Granulocytes: 0 %
Lymphocytes Absolute: 1.3 10*3/uL (ref 0.7–3.1)
Lymphs: 20 %
MCH: 32.5 pg (ref 26.6–33.0)
MCHC: 34.1 g/dL (ref 31.5–35.7)
MCV: 95 fL (ref 79–97)
Monocytes Absolute: 0.5 10*3/uL (ref 0.1–0.9)
Monocytes: 8 %
Neutrophils Absolute: 4.3 10*3/uL (ref 1.4–7.0)
Neutrophils: 66 %
Platelets: 160 10*3/uL (ref 150–450)
RBC: 5.04 x10E6/uL (ref 3.77–5.28)
RDW: 12.3 % (ref 11.7–15.4)
WBC: 6.4 10*3/uL (ref 3.4–10.8)

## 2021-06-29 LAB — BASIC METABOLIC PANEL
BUN/Creatinine Ratio: 23 (ref 12–28)
BUN: 35 mg/dL — ABNORMAL HIGH (ref 8–27)
CO2: 27 mmol/L (ref 20–29)
Calcium: 9.4 mg/dL (ref 8.7–10.3)
Chloride: 100 mmol/L (ref 96–106)
Creatinine, Ser: 1.51 mg/dL — ABNORMAL HIGH (ref 0.57–1.00)
Glucose: 115 mg/dL — ABNORMAL HIGH (ref 70–99)
Potassium: 4.4 mmol/L (ref 3.5–5.2)
Sodium: 141 mmol/L (ref 134–144)
eGFR: 37 mL/min/{1.73_m2} — ABNORMAL LOW (ref >59)

## 2021-06-29 LAB — LIPID PANEL
Chol/HDL Ratio: 2.6 ratio (ref 0.0–4.4)
Cholesterol, Total: 158 mg/dL (ref 100–199)
HDL: 61 mg/dL (ref >39)
LDL Chol Calc (NIH): 73 mg/dL (ref 0–99)
Triglycerides: 137 mg/dL (ref 0–149)
VLDL Cholesterol Cal: 24 mg/dL (ref 5–40)

## 2021-06-29 LAB — HEPATIC FUNCTION PANEL
ALT: 18 IU/L (ref 0–32)
AST: 19 IU/L (ref 0–40)
Albumin: 4.6 g/dL (ref 3.8–4.8)
Alkaline Phosphatase: 84 IU/L (ref 44–121)
Bilirubin Total: 0.7 mg/dL (ref 0.0–1.2)
Bilirubin, Direct: 0.19 mg/dL (ref 0.00–0.40)
Total Protein: 6.7 g/dL (ref 6.0–8.5)

## 2021-06-29 LAB — TSH: TSH: 1.61 u[IU]/mL (ref 0.450–4.500)

## 2021-06-29 NOTE — Telephone Encounter (Signed)
-----   Message from Garwin Brothers, MD sent at 06/29/2021 12:24 PM EDT ----- ?Overall labs are fine.  Please tell her to keep herself well-hydrated and repeat Chem-7 in 1 week.  Copy primary care ?Garwin Brothers, MD 06/29/2021 12:23 PM  ?

## 2021-07-05 ENCOUNTER — Encounter: Payer: Self-pay | Admitting: Cardiology

## 2021-07-05 ENCOUNTER — Other Ambulatory Visit: Payer: Self-pay | Admitting: Cardiology

## 2021-07-07 ENCOUNTER — Other Ambulatory Visit: Payer: Self-pay | Admitting: Cardiology

## 2021-07-07 LAB — BASIC METABOLIC PANEL
BUN/Creatinine Ratio: 22 (ref 12–28)
BUN: 28 mg/dL — ABNORMAL HIGH (ref 8–27)
CO2: 25 mmol/L (ref 20–29)
Calcium: 9.2 mg/dL (ref 8.7–10.3)
Chloride: 101 mmol/L (ref 96–106)
Creatinine, Ser: 1.28 mg/dL — ABNORMAL HIGH (ref 0.57–1.00)
Glucose: 108 mg/dL — ABNORMAL HIGH (ref 70–99)
Potassium: 4.5 mmol/L (ref 3.5–5.2)
Sodium: 139 mmol/L (ref 134–144)
eGFR: 45 mL/min/{1.73_m2} — ABNORMAL LOW (ref >59)

## 2021-09-26 ENCOUNTER — Other Ambulatory Visit: Payer: Self-pay | Admitting: Cardiology

## 2021-11-11 ENCOUNTER — Encounter: Payer: Self-pay | Admitting: Cardiology

## 2021-12-15 ENCOUNTER — Other Ambulatory Visit: Payer: Self-pay | Admitting: Cardiology

## 2021-12-27 ENCOUNTER — Other Ambulatory Visit: Payer: Self-pay

## 2021-12-27 ENCOUNTER — Other Ambulatory Visit: Payer: Self-pay | Admitting: Cardiology

## 2021-12-29 ENCOUNTER — Encounter: Payer: Self-pay | Admitting: Cardiology

## 2021-12-29 ENCOUNTER — Ambulatory Visit: Payer: Medicare Other | Attending: Cardiology | Admitting: Cardiology

## 2021-12-29 VITALS — BP 128/74 | HR 76 | Ht 64.0 in | Wt 126.6 lb

## 2021-12-29 DIAGNOSIS — I428 Other cardiomyopathies: Secondary | ICD-10-CM | POA: Diagnosis present

## 2021-12-29 DIAGNOSIS — I2510 Atherosclerotic heart disease of native coronary artery without angina pectoris: Secondary | ICD-10-CM | POA: Diagnosis present

## 2021-12-29 DIAGNOSIS — E088 Diabetes mellitus due to underlying condition with unspecified complications: Secondary | ICD-10-CM

## 2021-12-29 DIAGNOSIS — N182 Chronic kidney disease, stage 2 (mild): Secondary | ICD-10-CM

## 2021-12-29 NOTE — Progress Notes (Signed)
Cardiology Office Note:    Date:  12/29/2021   ID:  [name], DOB Mar 13, 1951, MRN [id number]  PCP:  [name], MD  Cardiologist:  Garwin Brothers, MD   Referring MD: [name], MD    ASSESSMENT:    1. Nonischemic cardiomyopathy (HCC)   2. Coronary artery disease involving native coronary artery of native heart without angina pectoris   3. Diabetes mellitus due to underlying condition with unspecified complications (HCC)   4. CKD (chronic kidney disease), stage II    PLAN:    In order of problems listed above:  Primary prevention stressed with the patient.  Importance of compliance with diet medication stressed and she vocalized understanding. Chronic systolic congestive heart failure: Stable and well compensated: Patient and on guideline directed medical therapy.  Diet, salt intake issues and medication compliance urged and she is doing well with this.  We will get a Chem-7 today. Hypertension: Blood pressure is stable and diet was emphasized.  Lifestyle modification urged. Nonischemic cardiomyopathy: Stable at this time. Ex-smoker: Promises never to go back to smoking. Mixed dyslipidemia: On lipid-lowering medications.  Followed by primary care.  Diet emphasized. Patient will be seen in follow-up appointment in 6 months or earlier if the patient has any concerns    Medication Adjustments/Labs and Tests Ordered: Current medicines are reviewed at length with the patient today.  Concerns regarding medicines are outlined above.  No orders of the defined types were placed in this encounter.  No orders of the defined types were placed in this encounter.    No chief complaint on file.    History of Present Illness:    ASHA Chung is a 71 y.o. female.  Patient has past medical history of cardiomyopathy and congestive heart., renal insufficiency, diabetes mellitus.  She denies any problems at this time and takes care of activities of daily living.   No chest pain orthopnea or PND.  With guideline directed medical therapy her ejection fraction is improved.  She denies any shortness of breath.  She is ambulating to the best of her ability.  At the time of my evaluation, the patient is alert awake oriented and in no distress.  Past Medical History:  Diagnosis Date   Abnormal nuclear stress test 03/29/2020   Acute combined systolic and diastolic heart failure (HCC) 03/09/2020   Acute on chronic combined systolic and diastolic CHF (congestive heart failure) (HCC)    Atypical Angina pectoris (HCC) 03/19/2020   CAD (coronary artery disease) 04/21/2020   CKD (chronic kidney disease), stage II    Depression    Diabetes mellitus due to underlying condition with unspecified complications (HCC) 03/09/2020   Diabetes mellitus, type 2 (HCC)    DVT (deep venous thrombosis) (HCC) 07/2011   S/P coumadin x 6 months   Dyspnea on exertion 03/19/2020   HTN (hypertension)    Hyperlipidemia    Hypertension    Hypokalemia 05/08/2012   Hypotension 05/08/2012   Iron deficiency anemia    Iron deficiency anemia, unspecified, symptomatic with dyspnea, dizziness, and shortness of breath with activity 05/08/2012   Leukocytosis 05/08/2012   Mitral regurgitation    Nonischemic cardiomyopathy (HCC) 04/21/2020   Pericardial effusion    small by echo 07/2020   Pericarditis 11/13/2020   Pleural effusion 10/26/2020   Pleural effusion on right    Pressure injury of skin 10/27/2020   Pulmonary hypertension, unspecified (HCC) 10/27/2020   Shortness of breath 10/26/2020   Tricuspid regurgitation  Wheezing    Recently put on albuterol, no history of asthma    Past Surgical History:  Procedure Laterality Date   APPENDECTOMY     Bilateral oophrectomy     Secondary to benign cysts   colonoscopy     with polypectomy   LEFT HEART CATH AND CORONARY ANGIOGRAPHY N/A 03/29/2020   Procedure: LEFT HEART CATH AND CORONARY ANGIOGRAPHY;  Surgeon: Marykay Lex, MD;  Location:  [facility] INVASIVE CV LAB;  Service: Cardiovascular;  Laterality: N/A;   RIGHT HEART CATH N/A 10/27/2020   Procedure: RIGHT HEART CATH;  Surgeon: Dolores Patty, MD;  Location: [facility] INVASIVE CV LAB;  Service: Cardiovascular;  Laterality: N/A;   TONSILLECTOMY      Current Medications: Current Meds  Medication Sig   Aflibercept (EYLEA) 2 MG/0.05ML SOLN 2 mg by Intravitreal route as directed. Every 10 weeks   aspirin 81 MG EC tablet Take 81 mg by mouth every evening.    carboxymethylcellulose (REFRESH PLUS) 0.5 % SOLN Place 1 drop into both eyes 3 (three) times daily as needed for dry eyes (dry eye).   empagliflozin (JARDIANCE) 10 MG TABS tablet Take 1 tablet (10 mg total) by mouth daily.   ENTRESTO 24-26 MG TAKE 1 TABLET BY MOUTH TWICE A DAY   ferrous sulfate 325 (65 FE) MG tablet Take 325 mg by mouth 4 (four) times a week.   metoprolol succinate (TOPROL-XL) 25 MG 24 hr tablet TAKE 1 TABLET BY MOUTH DAILY. TAKE WITH OR IMMEDIATELY FOLLOWING A MEAL.   Multiple Vitamins-Minerals (PRESERVISION AREDS 2+MULTI VIT) CAPS Take 1 Dose by mouth daily.   nitroGLYCERIN (NITROSTAT) 0.4 MG SL tablet Place 0.4 mg under the tongue every 5 (five) minutes as needed for chest pain.   simvastatin (ZOCOR) 20 MG tablet Take 20 mg by mouth every evening.    spironolactone (ALDACTONE) 25 MG tablet Take 1 tablet (25 mg total) by mouth daily.   torsemide (DEMADEX) 20 MG tablet Take 1.5 tablets (30 mg total) by mouth daily.     Allergies:   Patient has no known allergies.   Social History   Socioeconomic History   Marital status: Married    Spouse name: Naidelyn Parrella   Number of children: 2   Years of education: Not on file   Highest education level: Not on file  Occupational History   Occupation: Production manager for a company    Employer: [employer]  Tobacco Use   Smoking status: Former    Packs/day: 0.50    Years: 40.00    Total pack years: 20.00    Types: Cigarettes    Quit date: 07/2020    Years since quitting:  1.4   Smokeless tobacco: Never  Substance and Sexual Activity   Alcohol use: Yes    Alcohol/week: 15.0 standard drinks of alcohol    Types: 15 Cans of beer per week   Drug use: No   Sexual activity: Never  Other Topics Concern   Not on file  Social History Narrative   Married.  Lives in husband here in [place].  Normally independent of ADLs.   Social Determinants of Health   Financial Resource Strain: Not on file  Food Insecurity: Not on file  Transportation Needs: Not on file  Physical Activity: Not on file  Stress: Not on file  Social Connections: Not on file     Family History: The patient's family history includes Alcoholism in her father; Breast cancer in her mother; CAD in her paternal grandmother;  COPD in her mother; Diabetes in her maternal grandmother and paternal grandmother; Liver disease in her father; Stroke in her maternal grandfather and paternal grandfather; Uterine cancer in her mother.  ROS:   Please see the history of present illness.    All other systems reviewed and are negative.  EKGs/Labs/Other Studies Reviewed:    The following studies were reviewed today: I discussed my findings with the patient at length   Recent Labs: 03/14/2021: NT-Pro BNP 848 06/28/2021: ALT 18; Hemoglobin 16.4; Platelets 160; TSH 1.610 07/06/2021: BUN 28; Creatinine, Ser 1.28; Potassium 4.5; Sodium 139  Recent Lipid Panel    Component Value Date/Time   CHOL 158 06/28/2021 0942   TRIG 137 06/28/2021 0942   HDL 61 06/28/2021 0942   CHOLHDL 2.6 06/28/2021 0942   LDLCALC 73 06/28/2021 0942    Physical Exam:    VS:  BP 128/74   Pulse 76   Ht 5\' 4"  (1.626 m)   Wt 126 lb 9.6 oz (57.4 kg)   SpO2 95%   BMI 21.73 kg/m     Wt Readings from Last 3 Encounters:  12/29/21 126 lb 9.6 oz (57.4 kg)  06/28/21 126 lb (57.2 kg)  03/14/21 121 lb 3.2 oz (55 kg)     GEN: Patient is in no acute distress HEENT: Normal NECK: No JVD; No carotid bruits LYMPHATICS: No  lymphadenopathy CARDIAC: Hear sounds regular, 2/6 systolic murmur at the apex. RESPIRATORY:  Clear to auscultation without rales, wheezing or rhonchi  ABDOMEN: Soft, non-tender, non-distended MUSCULOSKELETAL:  No edema; No deformity  SKIN: Warm and dry NEUROLOGIC:  Alert and oriented x 3 PSYCHIATRIC:  Normal affect   Signed, Jenean Lindau, MD  12/29/2021 9:46 AM    [place]

## 2021-12-29 NOTE — Addendum Note (Signed)
Addended by: Eleonore Chiquito on: 12/29/2021 09:57 AM   Modules accepted: Orders

## 2021-12-29 NOTE — Patient Instructions (Signed)
Medication Instructions:  ?Your physician recommends that you continue on your current medications as directed. Please refer to the Current Medication list given to you today.  ?*If you need a refill on your cardiac medications before your next appointment, please call your pharmacy* ? ? ?Lab Work: ?Your physician recommends that you have a BMET today in the office. ? ?If you have labs (blood work) drawn today and your tests are completely normal, you will receive your results only by: ?MyChart Message (if you have MyChart) OR ?A paper copy in the mail ?If you have any lab test that is abnormal or we need to change your treatment, we will call you to review the results. ? ? ?Testing/Procedures: ?None ordered ? ? ?Follow-Up: ?At [facility], you and your health needs are our priority.  As part of our continuing mission to provide you with exceptional heart care, we have created designated Provider Care Teams.  These Care Teams include your primary Cardiologist (physician) and Advanced Practice Providers (APPs -  Physician Assistants and Nurse Practitioners) who all work together to provide you with the care you need, when you need it. ? ?We recommend signing up for the patient portal called "MyChart".  Sign up information is provided on this After Visit Summary.  MyChart is used to connect with patients for Virtual Visits (Telemedicine).  Patients are able to view lab/test results, encounter notes, upcoming appointments, etc.  Non-urgent messages can be sent to your provider as well.   ?To learn more about what you can do with MyChart, go to [email or web address].   ? ?Your next appointment:   ?6 month(s) ? ?The format for your next appointment:   ?In Person ? ?Provider:   ?Rajan Revankar, MD ? ? ?Other Instructions ?NA ? ?

## 2021-12-30 LAB — BASIC METABOLIC PANEL
BUN/Creatinine Ratio: 27 (ref 12–28)
BUN: 36 mg/dL — ABNORMAL HIGH (ref 8–27)
CO2: 28 mmol/L (ref 20–29)
Calcium: 9.4 mg/dL (ref 8.7–10.3)
Chloride: 102 mmol/L (ref 96–106)
Creatinine, Ser: 1.34 mg/dL — ABNORMAL HIGH (ref 0.57–1.00)
Glucose: 115 mg/dL — ABNORMAL HIGH (ref 70–99)
Potassium: 3.8 mmol/L (ref 3.5–5.2)
Sodium: 144 mmol/L (ref 134–144)
eGFR: 42 mL/min/{1.73_m2} — ABNORMAL LOW (ref >59)

## 2022-01-12 ENCOUNTER — Other Ambulatory Visit: Payer: Self-pay | Admitting: Cardiology

## 2022-04-07 LAB — LAB REPORT - SCANNED
A1c: 5.7
EGFR: 38

## 2022-06-16 ENCOUNTER — Other Ambulatory Visit: Payer: Self-pay | Admitting: Cardiology

## 2022-06-21 ENCOUNTER — Other Ambulatory Visit: Payer: Self-pay | Admitting: Cardiology

## 2022-07-07 ENCOUNTER — Ambulatory Visit: Payer: Medicare Other | Attending: Cardiology | Admitting: Cardiology

## 2022-07-07 ENCOUNTER — Encounter: Payer: Self-pay | Admitting: Cardiology

## 2022-07-07 VITALS — BP 106/62 | HR 65 | Ht 64.0 in | Wt 131.0 lb

## 2022-07-07 DIAGNOSIS — I10 Essential (primary) hypertension: Secondary | ICD-10-CM | POA: Diagnosis present

## 2022-07-07 DIAGNOSIS — I340 Nonrheumatic mitral (valve) insufficiency: Secondary | ICD-10-CM

## 2022-07-07 DIAGNOSIS — E782 Mixed hyperlipidemia: Secondary | ICD-10-CM | POA: Diagnosis present

## 2022-07-07 DIAGNOSIS — I428 Other cardiomyopathies: Secondary | ICD-10-CM

## 2022-07-07 DIAGNOSIS — I2510 Atherosclerotic heart disease of native coronary artery without angina pectoris: Secondary | ICD-10-CM | POA: Diagnosis not present

## 2022-07-07 NOTE — Progress Notes (Signed)
Cardiology Office Note:    Date:  07/07/2022   ID:  [name], DOB Feb 10, 1951, MRN [id number]  PCP:  Enid Skeens., MD  Cardiologist:  Jenean Lindau, MD   Referring MD: Enid Skeens., MD    ASSESSMENT:    1. Coronary artery disease involving native coronary artery of native heart without angina pectoris   2. Mitral valve insufficiency, unspecified etiology   3. Nonischemic cardiomyopathy ([place])   4. Mixed hyperlipidemia    PLAN:    In order of problems listed above:  Coronary artery disease: Secondary prevention stressed with the patient.  Importance of compliance with diet medication stressed and she vocalized understanding. History of congestive heart failure: CHF education was given.  She has gained weight after going on a cruise.  She promises to do better.  Diet and daily weight checks were advised and she is doing this very meticulously. Essential hypertension: Blood pressure stable and diet was emphasized. Mixed dyslipidemia: On lipid-lowering medications followed by primary care.  She is fasting and will have complete blood work today. Patient will be seen in follow-up appointment in 6 months or earlier if the patient has any concerns.    Medication Adjustments/Labs and Tests Ordered: Current medicines are reviewed at length with the patient today.  Concerns regarding medicines are outlined above.  No orders of the defined types were placed in this encounter.  No orders of the defined types were placed in this encounter.    No chief complaint on file.    History of Present Illness:    [name] is a 72 y.o. female.  Patient has past medical history of coronary artery disease, cardiomyopathy which has improved significantly on guideline directed medical therapy, diabetes mellitus and dyslipidemia.  She denies any problems at this time and takes care of activities of daily living.  No chest pain orthopnea or PND.  At the time of my  evaluation, the patient is alert awake oriented and in no distress.  She has history of congestive heart failure.  She tells me that she was not on a cruise and gained some weight but she is feeling better now.  Past Medical History:  Diagnosis Date   Abnormal nuclear stress test 03/29/2020   Acute combined systolic and diastolic heart failure ([place]) 03/09/2020   Acute on chronic combined systolic and diastolic CHF (congestive heart failure) (HCC)    Atypical Angina pectoris ([place]) 03/19/2020   CAD (coronary artery disease) 04/21/2020   CKD (chronic kidney disease), stage II    Depression    Diabetes mellitus due to underlying condition with unspecified complications ([place]) [id number]   Diabetes mellitus, type 2 (HCC)    DVT (deep venous thrombosis) ([place]) 07/2011   S/P coumadin x 6 months   Dyspnea on exertion 03/19/2020   HTN (hypertension)    Hyperlipidemia    Hypertension    Hypokalemia 05/08/2012   Hypotension 05/08/2012   Iron deficiency anemia    Iron deficiency anemia, unspecified, symptomatic with dyspnea, dizziness, and shortness of breath with activity 05/08/2012   Leukocytosis 05/08/2012   Mitral regurgitation    Nonischemic cardiomyopathy ([place]) 04/21/2020   Pericardial effusion    small by echo 07/2020   Pericarditis 11/13/2020   Pleural effusion 10/26/2020   Pleural effusion on right    Pressure injury of skin 10/27/2020   Pulmonary hypertension, unspecified ([place]) 10/27/2020   Shortness of breath 10/26/2020   Tricuspid regurgitation    Wheezing    Recently  put on albuterol, no history of asthma    Past Surgical History:  Procedure Laterality Date   APPENDECTOMY     Bilateral oophrectomy     Secondary to benign cysts   colonoscopy     with polypectomy   LEFT HEART CATH AND CORONARY ANGIOGRAPHY N/A 03/29/2020   Procedure: LEFT HEART CATH AND CORONARY ANGIOGRAPHY;  Surgeon: Leonie Man, MD;  Location: [facility] CV LAB;  Service: Cardiovascular;  Laterality: N/A;    RIGHT HEART CATH N/A 10/27/2020   Procedure: RIGHT HEART CATH;  Surgeon: Jolaine Artist, MD;  Location: [place] CV LAB;  Service: Cardiovascular;  Laterality: N/A;   TONSILLECTOMY      Current Medications: Current Meds  Medication Sig   Aflibercept (EYLEA) 2 MG/0.05ML SOLN 2 mg by Intravitreal route as directed. Every 10 weeks   aspirin 81 MG EC tablet Take 81 mg by mouth every evening.    carboxymethylcellulose (REFRESH PLUS) 0.5 % SOLN Place 1 drop into both eyes 3 (three) times daily as needed for dry eyes (dry eye).   empagliflozin (JARDIANCE) 10 MG TABS tablet Take 1 tablet (10 mg total) by mouth daily.   ferrous sulfate 325 (65 FE) MG tablet Take 325 mg by mouth 4 (four) times a week.   metoprolol succinate (TOPROL-XL) 25 MG 24 hr tablet TAKE 1 TABLET BY MOUTH DAILY. TAKE WITH OR IMMEDIATELY FOLLOWING A MEAL.   Multiple Vitamins-Minerals (PRESERVISION AREDS 2+MULTI VIT) CAPS Take 1 Dose by mouth daily.   nitroGLYCERIN (NITROSTAT) 0.4 MG SL tablet Place 0.4 mg under the tongue every 5 (five) minutes as needed for chest pain.   sacubitril-valsartan (ENTRESTO) 24-26 MG Take 1 tablet by mouth 2 (two) times daily.   simvastatin (ZOCOR) 20 MG tablet Take 20 mg by mouth every evening.    spironolactone (ALDACTONE) 25 MG tablet Take 1 tablet (25 mg total) by mouth daily.   torsemide (DEMADEX) 20 MG tablet Take 1.5 tablets (30 mg total) by mouth daily.     Allergies:   Patient has no known allergies.   Social History   Socioeconomic History   Marital status: Married    Spouse name: Nylia Sweigart   Number of children: 2   Years of education: Not on file   Highest education level: Not on file  Occupational History   Occupation: Banker for a company    Employer: [employer]  Tobacco Use   Smoking status: Former    Packs/day: 0.50    Years: 40.00    Additional pack years: 0.00    Total pack years: 20.00    Types: Cigarettes    Quit date: 07/2020    Years since quitting: 1.9    Smokeless tobacco: Never  Substance and Sexual Activity   Alcohol use: Yes    Alcohol/week: 15.0 standard drinks of alcohol    Types: 15 Cans of beer per week   Drug use: No   Sexual activity: Never  Other Topics Concern   Not on file  Social History Narrative   Married.  Lives in husband here in [place].  Normally independent of ADLs.   Social Determinants of Health   Financial Resource Strain: Not on file  Food Insecurity: Not on file  Transportation Needs: Not on file  Physical Activity: Not on file  Stress: Not on file  Social Connections: Not on file     Family History: The patient's family history includes Alcoholism in her father; Breast cancer in her mother; CAD  in her paternal grandmother; COPD in her mother; Diabetes in her maternal grandmother and paternal grandmother; Liver disease in her father; Stroke in her maternal grandfather and paternal grandfather; Uterine cancer in her mother.  ROS:   Please see the history of present illness.    All other systems reviewed and are negative.  EKGs/Labs/Other Studies Reviewed:    The following studies were reviewed today: I discussed my findings with the patient at length.  EKG reveals sinus rhythm PACs and poor anterior forces.   Recent Labs: 12/29/2021: BUN 36; Creatinine, Ser 1.34; Potassium 3.8; Sodium 144  Recent Lipid Panel    Component Value Date/Time   CHOL 158 06/28/2021 0942   TRIG 137 06/28/2021 0942   HDL 61 06/28/2021 0942   CHOLHDL 2.6 06/28/2021 0942   LDLCALC 73 06/28/2021 0942    Physical Exam:    VS:  BP 106/62   Pulse 65   Ht 5\' 4"  (1.626 m)   Wt 131 lb (59.4 kg)   SpO2 97%   BMI 22.49 kg/m     Wt Readings from Last 3 Encounters:  07/07/22 131 lb (59.4 kg)  12/29/21 126 lb 9.6 oz (57.4 kg)  06/28/21 126 lb (57.2 kg)     GEN: Patient is in no acute distress HEENT: Normal NECK: No JVD; No carotid bruits LYMPHATICS: No lymphadenopathy CARDIAC: Hear sounds regular, 2/6 systolic  murmur at the apex. RESPIRATORY:  Clear to auscultation without rales, wheezing or rhonchi  ABDOMEN: Soft, non-tender, non-distended MUSCULOSKELETAL:  No edema; No deformity  SKIN: Warm and dry NEUROLOGIC:  Alert and oriented x 3 PSYCHIATRIC:  Normal affect   Signed, Jenean Lindau, MD  07/07/2022 9:56 AM    [facility]

## 2022-07-07 NOTE — Patient Instructions (Signed)
Medication Instructions:  Your physician recommends that you continue on your current medications as directed. Please refer to the Current Medication list given to you today.  *If you need a refill on your cardiac medications before your next appointment, please call your pharmacy*   Lab Work: Your physician recommends that you have labs done in the office today. Your test included complete metabolic panel, complete blood count, TSH and lipids.  If you have labs (blood work) drawn today and your tests are completely normal, you will receive your results only by: [place] (if you have MyChart) OR A paper copy in the mail If you have any lab test that is abnormal or we need to change your treatment, we will call you to review the results.   Testing/Procedures: None ordered   Follow-Up: At [facility], you and your health needs are our priority.  As part of our continuing mission to provide you with exceptional heart care, we have created designated Provider Care Teams.  These Care Teams include your primary Cardiologist (physician) and Advanced Practice Providers (APPs -  Physician Assistants and Nurse Practitioners) who all work together to provide you with the care you need, when you need it.  We recommend signing up for the patient portal called "MyChart".  Sign up information is provided on this After Visit Summary.  MyChart is used to connect with patients for Virtual Visits (Telemedicine).  Patients are able to view lab/test results, encounter notes, upcoming appointments, etc.  Non-urgent messages can be sent to your provider as well.   To learn more about what you can do with MyChart, go to [email or web address].    Your next appointment:   9 month(s)  The format for your next appointment:   In Person  Provider:   Jyl Heinz, MD    Other Instructions none  Important Information About Sugar

## 2022-07-08 LAB — CBC
Hematocrit: 50.4 % — ABNORMAL HIGH (ref 34.0–46.6)
Hemoglobin: 16.8 g/dL — ABNORMAL HIGH (ref 11.1–15.9)
MCH: 32.1 pg (ref 26.6–33.0)
MCHC: 33.3 g/dL (ref 31.5–35.7)
MCV: 96 fL (ref 79–97)
Platelets: 157 10*3/uL (ref 150–450)
RBC: 5.24 x10E6/uL (ref 3.77–5.28)
RDW: 12.2 % (ref 11.7–15.4)
WBC: 6.7 10*3/uL (ref 3.4–10.8)

## 2022-07-08 LAB — COMPREHENSIVE METABOLIC PANEL
ALT: 17 IU/L (ref 0–32)
AST: 17 IU/L (ref 0–40)
Albumin/Globulin Ratio: 2 (ref 1.2–2.2)
Albumin: 4.4 g/dL (ref 3.8–4.8)
Alkaline Phosphatase: 98 IU/L (ref 44–121)
BUN/Creatinine Ratio: 29 — ABNORMAL HIGH (ref 12–28)
BUN: 33 mg/dL — ABNORMAL HIGH (ref 8–27)
Bilirubin Total: 0.9 mg/dL (ref 0.0–1.2)
CO2: 21 mmol/L (ref 20–29)
Calcium: 9.3 mg/dL (ref 8.7–10.3)
Chloride: 101 mmol/L (ref 96–106)
Creatinine, Ser: 1.13 mg/dL — ABNORMAL HIGH (ref 0.57–1.00)
Globulin, Total: 2.2 g/dL (ref 1.5–4.5)
Glucose: 113 mg/dL — ABNORMAL HIGH (ref 70–99)
Potassium: 4 mmol/L (ref 3.5–5.2)
Sodium: 142 mmol/L (ref 134–144)
Total Protein: 6.6 g/dL (ref 6.0–8.5)
eGFR: 52 mL/min/{1.73_m2} — ABNORMAL LOW (ref >59)

## 2022-07-08 LAB — SPECIMEN STATUS REPORT

## 2022-07-08 LAB — LIPID PANEL
Chol/HDL Ratio: 2.8 ratio (ref 0.0–4.4)
Cholesterol, Total: 170 mg/dL (ref 100–199)
HDL: 60 mg/dL (ref >39)
LDL Chol Calc (NIH): 85 mg/dL (ref 0–99)
Triglycerides: 145 mg/dL (ref 0–149)
VLDL Cholesterol Cal: 25 mg/dL (ref 5–40)

## 2022-07-08 LAB — TSH: TSH: 1.13 u[IU]/mL (ref 0.450–4.500)

## 2022-07-25 IMAGING — US US THORACENTESIS ASP PLEURAL SPACE W/IMG GUIDE
1 series · 2 of 2 positions shown · non-contrast
Comparison: none

INDICATION: Shortness of breath. Large right pleural effusion. Request
diagnostic and therapeutic thoracentesis.

[Series 1: us thoracentesis asp pleural s mc & wl · 2 of 2 slices shown]
[im 1/2]
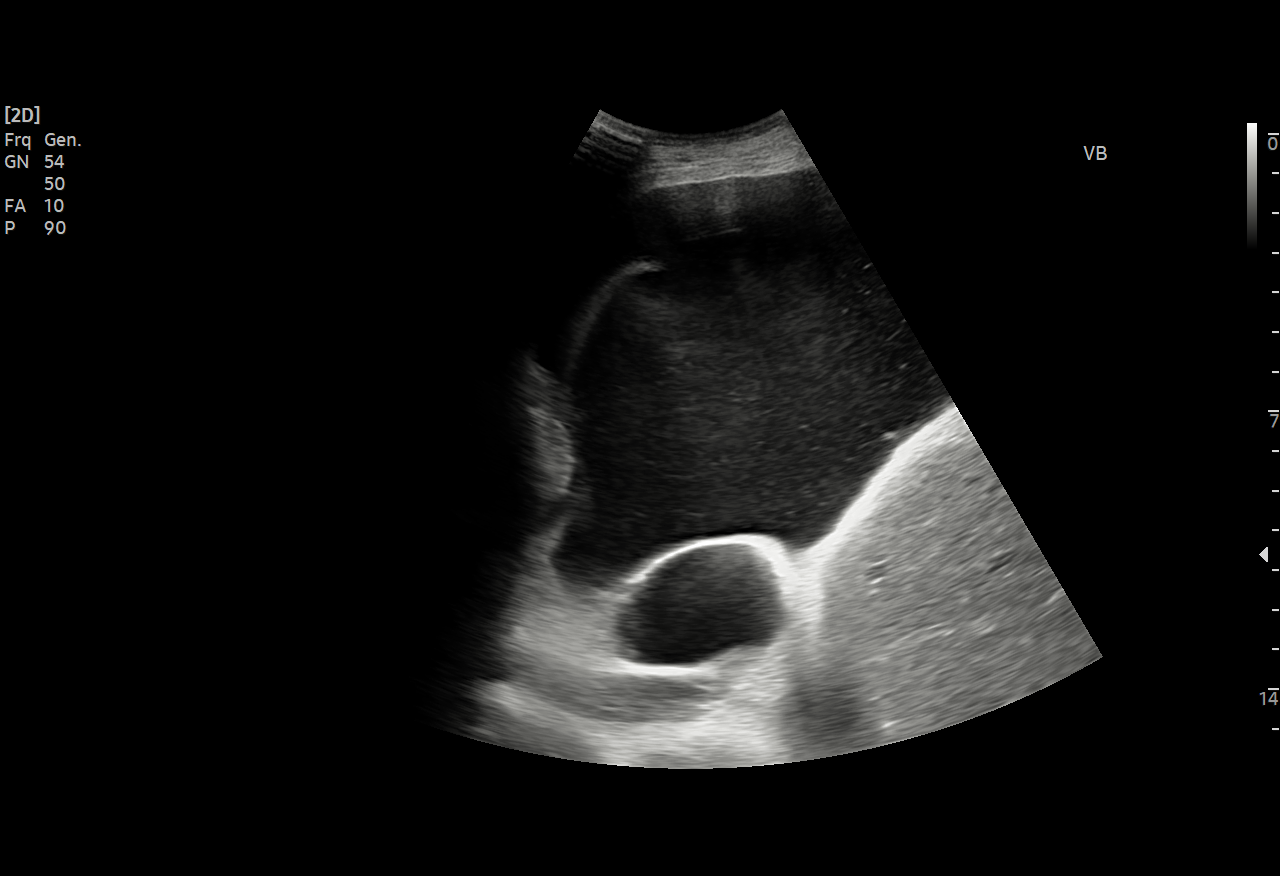
[im 2/2]
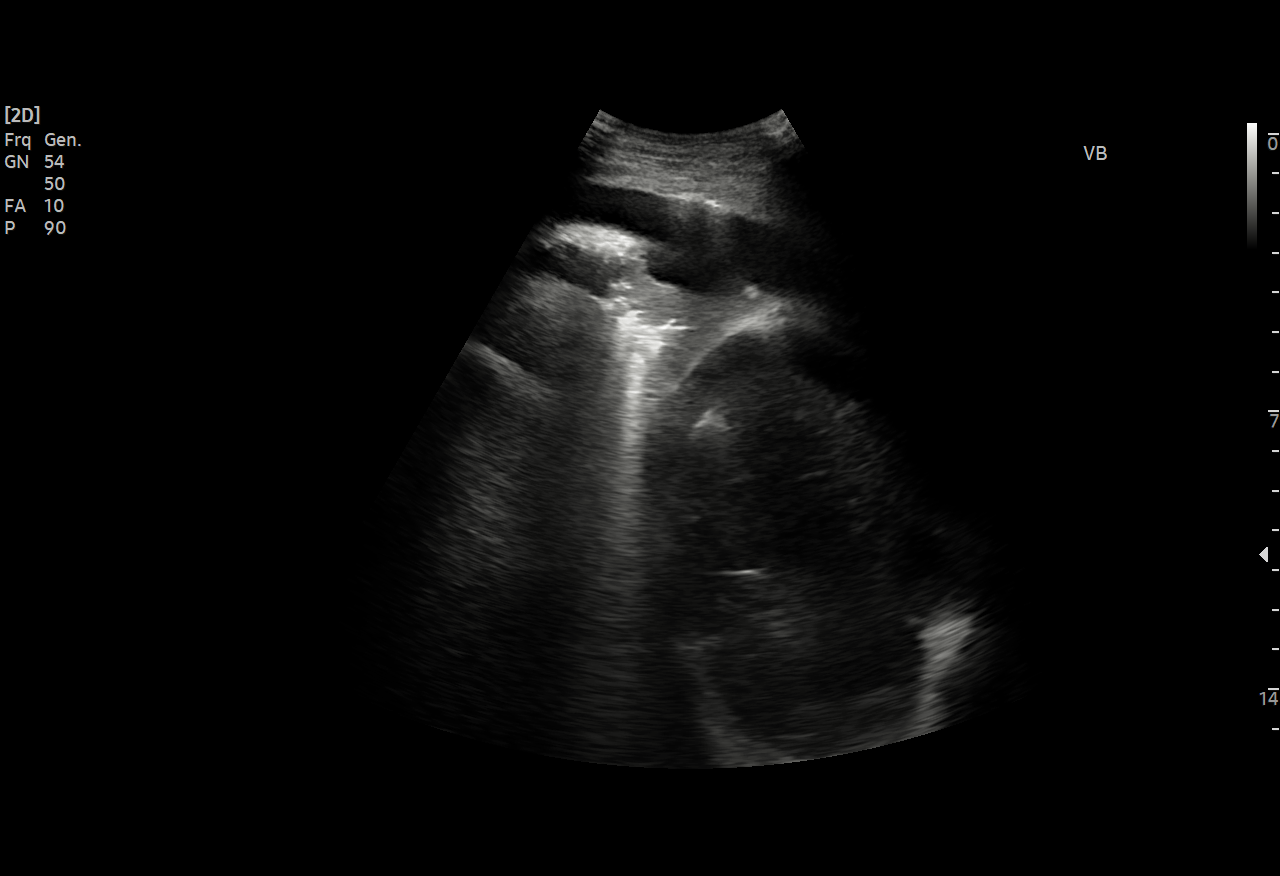

[2 of 2 positions shown; findings below may reference images not displayed]

EXAM:
ULTRASOUND GUIDED RIGHT THORACENTESIS

MEDICATIONS:
1% plain lidocaine, 5 mL

COMPLICATIONS:
None immediate.  No pneumothorax on follow-up chest radiograph.

PROCEDURE:
An ultrasound guided thoracentesis was thoroughly discussed with the
patient and questions answered. The benefits, risks, alternatives
and complications were also discussed. The patient understands and
wishes to proceed with the procedure. Written consent was obtained.

Ultrasound was performed to localize and mark an adequate pocket of
fluid in the right chest. The area was then prepped and draped in
the normal sterile fashion. 1% Lidocaine was used for local
anesthesia. Under ultrasound guidance a 6 Fr Safe-T-Centesis
catheter was introduced. Thoracentesis was performed. The catheter
was removed and a dressing applied.
FINDINGS: A total of approximately 1.9 L of clear yellow fluid was removed.
Samples were sent to the laboratory as requested by the clinical
team.
IMPRESSION: Successful ultrasound guided right thoracentesis yielding 1.9 L of
pleural fluid.

## 2022-12-13 ENCOUNTER — Other Ambulatory Visit: Payer: Self-pay | Admitting: Cardiology

## 2022-12-18 ENCOUNTER — Other Ambulatory Visit: Payer: Self-pay | Admitting: Cardiology

## 2022-12-23 ENCOUNTER — Other Ambulatory Visit: Payer: Self-pay | Admitting: Cardiology

## 2023-01-01 ENCOUNTER — Other Ambulatory Visit: Payer: Self-pay | Admitting: Cardiology

## 2023-03-29 ENCOUNTER — Ambulatory Visit: Payer: Medicare Other | Admitting: Cardiology

## 2023-04-03 ENCOUNTER — Ambulatory Visit: Payer: Medicare Other | Attending: Cardiology | Admitting: Cardiology

## 2023-04-03 ENCOUNTER — Encounter: Payer: Self-pay | Admitting: Cardiology

## 2023-04-03 VITALS — BP 98/52 | HR 76 | Ht 64.0 in | Wt 135.6 lb

## 2023-04-03 DIAGNOSIS — E088 Diabetes mellitus due to underlying condition with unspecified complications: Secondary | ICD-10-CM

## 2023-04-03 DIAGNOSIS — I10 Essential (primary) hypertension: Secondary | ICD-10-CM | POA: Diagnosis present

## 2023-04-03 DIAGNOSIS — I5043 Acute on chronic combined systolic (congestive) and diastolic (congestive) heart failure: Secondary | ICD-10-CM | POA: Diagnosis present

## 2023-04-03 DIAGNOSIS — I428 Other cardiomyopathies: Secondary | ICD-10-CM | POA: Diagnosis present

## 2023-04-03 DIAGNOSIS — E782 Mixed hyperlipidemia: Secondary | ICD-10-CM

## 2023-04-03 NOTE — Patient Instructions (Addendum)
Medication Instructions:  Your physician recommends that you continue on your current medications as directed. Please refer to the Current Medication list given to you today.  *If you need a refill on your cardiac medications before your next appointment, please call your pharmacy*   Lab Work: Your physician recommends that you have a CMP, CBC, TSH and lipids today in the office.  If you have labs (blood work) drawn today and your tests are completely normal, you will receive your results only by: MyChart Message (if you have MyChart) OR A paper copy in the mail If you have any lab test that is abnormal or we need to change your treatment, we will call you to review the results.   Testing/Procedures: Your physician has requested that you have an echocardiogram. Echocardiography is a painless test that uses sound waves to create images of your heart. It provides your doctor with information about the size and shape of your heart and how well your heart's chambers and valves are working. This procedure takes approximately one hour. There are no restrictions for this procedure. Please do NOT wear cologne, perfume, aftershave, or lotions (deodorant is allowed). Please arrive 15 minutes prior to your appointment time.     Follow-Up: At [facility], you and your health needs are our priority.  As part of our continuing mission to provide you with exceptional heart care, we have created designated Provider Care Teams.  These Care Teams include your primary Cardiologist (physician) and Advanced Practice Providers (APPs -  Physician Assistants and Nurse Practitioners) who all work together to provide you with the care you need, when you need it.  We recommend signing up for the patient portal called "MyChart".  Sign up information is provided on this After Visit Summary.  MyChart is used to connect with patients for Virtual Visits (Telemedicine).  Patients are able to view lab/test results,  encounter notes, upcoming appointments, etc.  Non-urgent messages can be sent to your provider as well.   To learn more about what you can do with MyChart, go to [email or web address].    Your next appointment:   6 month(s)  The format for your next appointment:   In Person  Provider:      Other Instructions Echocardiogram An echocardiogram is a test that uses sound waves (ultrasound) to produce images of the heart. Images from an echocardiogram can provide important information about: Heart size and shape. The size and thickness and movement of your heart's walls. Heart muscle function and strength. Heart valve function or if you have stenosis. Stenosis is when the heart valves are too narrow. If blood is flowing backward through the heart valves (regurgitation). A tumor or infectious growth around the heart valves. Areas of heart muscle that are not working well because of poor blood flow or injury from a heart attack. Aneurysm detection. An aneurysm is a weak or damaged part of an artery wall. The wall bulges out from the normal force of blood pumping through the body. Tell a health care provider about: Any allergies you have. All medicines you are taking, including vitamins, herbs, eye drops, creams, and over-the-counter medicines. Any blood disorders you have. Any surgeries you have had. Any medical conditions you have. Whether you are pregnant or may be pregnant. What are the risks? Generally, this is a safe test. However, problems may occur, including an allergic reaction to dye (contrast) that may be used during the test. What happens before the test? No specific preparation is needed.  You may eat and drink normally. What happens during the test? You will take off your clothes from the waist up and put on a hospital gown. Electrodes or electrocardiogram (ECG)patches may be placed on your chest. The electrodes or patches are then connected to a device that monitors your  heart rate and rhythm. You will lie down on a table for an ultrasound exam. A gel will be applied to your chest to help sound waves pass through your skin. A handheld device, called a transducer, will be pressed against your chest and moved over your heart. The transducer produces sound waves that travel to your heart and bounce back (or "echo" back) to the transducer. These sound waves will be captured in real-time and changed into images of your heart that can be viewed on a video monitor. The images will be recorded on a computer and reviewed by your health care provider. You may be asked to change positions or hold your breath for a short time. This makes it easier to get different views or better views of your heart. In some cases, you may receive contrast through an IV in one of your veins. This can improve the quality of the pictures from your heart. The procedure may vary among health care providers and hospitals.   What can I expect after the test? You may return to your normal, everyday life, including diet, activities, and medicines, unless your health care provider tells you not to do that. Follow these instructions at home: It is up to you to get the results of your test. Ask your health care provider, or the department that is doing the test, when your results will be ready. Keep all follow-up visits. This is important. Summary An echocardiogram is a test that uses sound waves (ultrasound) to produce images of the heart. Images from an echocardiogram can provide important information about the size and shape of your heart, heart muscle function, heart valve function, and other possible heart problems. You do not need to do anything to prepare before this test. You may eat and drink normally. After the echocardiogram is completed, you may return to your normal, everyday life, unless your health care provider tells you not to do that. This information is not intended to replace advice  given to you by your health care provider. Make sure you discuss any questions you have with your health care provider. Document Revised: 11/25/2019 Document Reviewed: 11/25/2019 Elsevier Patient Education  2021 Elsevier Inc.   Important Information About Sugar

## 2023-04-03 NOTE — Progress Notes (Signed)
Cardiology Office Note:    Date:  04/03/2023   ID:  [name], DOB 1950-05-28, MRN [id number]  PCP:  [name], MD  Cardiologist:  Garwin Brothers, MD   Referring MD: [name], MD    ASSESSMENT:    1. Nonischemic cardiomyopathy (HCC)   2. Primary hypertension   3. Diabetes mellitus due to underlying condition with unspecified complications (HCC)   4. Mixed hyperlipidemia    PLAN:    In order of problems listed above:  Primary prevention stressed with the patient.  Importance of compliance with diet medication stressed and patient verbalized standing. Nonischemic cardiomyopathy: Moderately depressed ejection fraction.  Congestive heart failure education was given diet explained she checks her weights on a regular basis first thing in the morning.  I congratulated her about her meticulous perspective about this. Essential hypertension: Blood pressure is borderline.  She is asymptomatic and I discussed this with her. Mixed dyslipidemia: On lipid-lowering medications followed by primary care. History of alcohol use: I discussed with her the adverse effects.  She vocalized understanding and questions were answered to her satisfaction. She is fasting and will have complete blood work today. Patient will be seen in follow-up appointment in 6 months or earlier if the patient has any concerns.    Medication Adjustments/Labs and Tests Ordered: Current medicines are reviewed at length with the patient today.  Concerns regarding medicines are outlined above.  No orders of the defined types were placed in this encounter.  No orders of the defined types were placed in this encounter.    No chief complaint on file.    History of Present Illness:    [name] is a 72 y.o. female.  Patient has past medical history of nonischemic cardiomyopathy, essential hypertension, mixed dyslipidemia.  She has history of significant alcohol use and admits to it.  No  history of falls no chest pain orthopnea or PND.  At the time of my evaluation, the patient is alert awake oriented and in no distress.  Past Medical History:  Diagnosis Date   Abnormal nuclear stress test 03/29/2020   Acute combined systolic and diastolic heart failure (HCC) 03/09/2020   Acute on chronic combined systolic and diastolic CHF (congestive heart failure) (HCC)    Atypical Angina pectoris (HCC) 03/19/2020   CAD (coronary artery disease) 04/21/2020   CKD (chronic kidney disease), stage II    Depression    Diabetes mellitus due to underlying condition with unspecified complications (HCC) 03/09/2020   Diabetes mellitus, type 2 (HCC)    DVT (deep venous thrombosis) (HCC) 07/2011   S/P coumadin x 6 months   Dyspnea on exertion 03/19/2020   HTN (hypertension)    Hyperlipidemia    Hypertension    Hypokalemia 05/08/2012   Hypotension 05/08/2012   Iron deficiency anemia    Iron deficiency anemia, unspecified, symptomatic with dyspnea, dizziness, and shortness of breath with activity 05/08/2012   Leukocytosis 05/08/2012   Mitral regurgitation    Nonischemic cardiomyopathy (HCC) 04/21/2020   Pericardial effusion    small by echo 07/2020   Pericarditis 11/13/2020   Pleural effusion 10/26/2020   Pleural effusion on right    Pressure injury of skin 10/27/2020   Pulmonary hypertension, unspecified (HCC) 10/27/2020   Shortness of breath 10/26/2020   Tricuspid regurgitation    Wheezing    Recently put on albuterol, no history of asthma    Past Surgical History:  Procedure Laterality Date   APPENDECTOMY  Bilateral oophrectomy     Secondary to benign cysts   colonoscopy     with polypectomy   LEFT HEART CATH AND CORONARY ANGIOGRAPHY N/A 03/29/2020   Procedure: LEFT HEART CATH AND CORONARY ANGIOGRAPHY;  Surgeon: Marykay Lex, MD;  Location: [facility] INVASIVE CV LAB;  Service: Cardiovascular;  Laterality: N/A;   RIGHT HEART CATH N/A 10/27/2020   Procedure: RIGHT HEART CATH;  Surgeon:  Dolores Patty, MD;  Location: [facility] INVASIVE CV LAB;  Service: Cardiovascular;  Laterality: N/A;   TONSILLECTOMY      Current Medications: Current Meds  Medication Sig   Aflibercept (EYLEA) 2 MG/0.05ML SOLN 2 mg by Intravitreal route as directed. Every 10 weeks   aspirin 81 MG EC tablet Take 81 mg by mouth every evening.    carboxymethylcellulose (REFRESH PLUS) 0.5 % SOLN Place 1 drop into both eyes 3 (three) times daily as needed for dry eyes (dry eye).   empagliflozin (JARDIANCE) 10 MG TABS tablet Take 1 tablet (10 mg total) by mouth daily.   ENTRESTO 24-26 MG TAKE 1 TABLET BY MOUTH TWICE A DAY   ferrous sulfate 325 (65 FE) MG tablet Take 325 mg by mouth 4 (four) times a week.   metoprolol succinate (TOPROL-XL) 25 MG 24 hr tablet TAKE 1 TABLET BY MOUTH EVERY DAY WITH OR IMMEDIATELY FOLLOWING A MEAL   Multiple Vitamins-Minerals (PRESERVISION AREDS 2+MULTI VIT) CAPS Take 1 Dose by mouth daily.   nitroGLYCERIN (NITROSTAT) 0.4 MG SL tablet Place 0.4 mg under the tongue every 5 (five) minutes as needed for chest pain.   simvastatin (ZOCOR) 20 MG tablet Take 20 mg by mouth every evening.    spironolactone (ALDACTONE) 25 MG tablet Take 1 tablet (25 mg total) by mouth daily.   torsemide (DEMADEX) 20 MG tablet Take 1.5 tablets (30 mg total) by mouth daily.     Allergies:   Patient has no known allergies.   Social History   Socioeconomic History   Marital status: Married    Spouse name: Caryol Shane   Number of children: 2   Years of education: Not on file   Highest education level: Not on file  Occupational History   Occupation: Production manager for a company    Employer: [employer]  Tobacco Use   Smoking status: Former    Current packs/day: 0.00    Average packs/day: 0.5 packs/day for 40.0 years (20.0 ttl pk-yrs)    Types: Cigarettes    Start date: 07/1980    Quit date: 07/2020    Years since quitting: 2.7   Smokeless tobacco: Never  Substance and Sexual Activity   Alcohol use: Yes     Alcohol/week: 15.0 standard drinks of alcohol    Types: 15 Cans of beer per week   Drug use: No   Sexual activity: Never  Other Topics Concern   Not on file  Social History Narrative   Married.  Lives in husband here in [place].  Normally independent of ADLs.   Social Drivers of Corporate investment banker Strain: Not on file  Food Insecurity: Not on file  Transportation Needs: Not on file  Physical Activity: Not on file  Stress: Not on file  Social Connections: Not on file     Family History: The patient's family history includes Alcoholism in her father; Breast cancer in her mother; CAD in her paternal grandmother; COPD in her mother; Diabetes in her maternal grandmother and paternal grandmother; Liver disease in her father; Stroke in her maternal  grandfather and paternal grandfather; Uterine cancer in her mother.  ROS:   Please see the history of present illness.    All other systems reviewed and are negative.  EKGs/Labs/Other Studies Reviewed:    The following studies were reviewed today: I discussed my findings with the patient at length    Recent Labs: 07/07/2022: ALT 17; BUN 33; Creatinine, Ser 1.13; Hemoglobin 16.8; Platelets 157; Potassium 4.0; Sodium 142; TSH 1.130  Recent Lipid Panel    Component Value Date/Time   CHOL 170 07/07/2022 0000   TRIG 145 07/07/2022 0000   HDL 60 07/07/2022 0000   CHOLHDL 2.8 07/07/2022 0000   LDLCALC 85 07/07/2022 0000    Physical Exam:    VS:  BP (!) 98/52   Pulse 76   Ht 5\' 4"  (1.626 m)   Wt 135 lb 9.6 oz (61.5 kg)   SpO2 98%   BMI 23.28 kg/m     Wt Readings from Last 3 Encounters:  04/03/23 135 lb 9.6 oz (61.5 kg)  07/07/22 131 lb (59.4 kg)  12/29/21 126 lb 9.6 oz (57.4 kg)     GEN: Patient is in no acute distress HEENT: Normal NECK: No JVD; No carotid bruits LYMPHATICS: No lymphadenopathy CARDIAC: Hear sounds regular, 2/6 systolic murmur at the apex. RESPIRATORY:  Clear to auscultation without rales,  wheezing or rhonchi  ABDOMEN: Soft, non-tender, non-distended MUSCULOSKELETAL:  No edema; No deformity  SKIN: Warm and dry NEUROLOGIC:  Alert and oriented x 3 PSYCHIATRIC:  Normal affect   Signed, Garwin Brothers, MD  04/03/2023 9:50 AM    [facility]

## 2023-04-04 LAB — COMPREHENSIVE METABOLIC PANEL
ALT: 12 [IU]/L (ref 0–32)
AST: 18 [IU]/L (ref 0–40)
Albumin: 4.1 g/dL (ref 3.8–4.8)
Alkaline Phosphatase: 80 [IU]/L (ref 44–121)
BUN/Creatinine Ratio: 24 (ref 12–28)
BUN: 30 mg/dL — ABNORMAL HIGH (ref 8–27)
Bilirubin Total: 0.5 mg/dL (ref 0.0–1.2)
CO2: 24 mmol/L (ref 20–29)
Calcium: 9.3 mg/dL (ref 8.7–10.3)
Chloride: 101 mmol/L (ref 96–106)
Creatinine, Ser: 1.24 mg/dL — ABNORMAL HIGH (ref 0.57–1.00)
Globulin, Total: 2 g/dL (ref 1.5–4.5)
Glucose: 101 mg/dL — ABNORMAL HIGH (ref 70–99)
Potassium: 4.6 mmol/L (ref 3.5–5.2)
Sodium: 141 mmol/L (ref 134–144)
Total Protein: 6.1 g/dL (ref 6.0–8.5)
eGFR: 46 mL/min/{1.73_m2} — ABNORMAL LOW (ref >59)

## 2023-04-04 LAB — CBC
Hematocrit: 50.3 % — ABNORMAL HIGH (ref 34.0–46.6)
Hemoglobin: 16.5 g/dL — ABNORMAL HIGH (ref 11.1–15.9)
MCH: 32.2 pg (ref 26.6–33.0)
MCHC: 32.8 g/dL (ref 31.5–35.7)
MCV: 98 fL — ABNORMAL HIGH (ref 79–97)
Platelets: 178 10*3/uL (ref 150–450)
RBC: 5.13 x10E6/uL (ref 3.77–5.28)
RDW: 12.1 % (ref 11.7–15.4)
WBC: 7.9 10*3/uL (ref 3.4–10.8)

## 2023-04-04 LAB — LIPID PANEL
Chol/HDL Ratio: 2.8 {ratio} (ref 0.0–4.4)
Cholesterol, Total: 141 mg/dL (ref 100–199)
HDL: 51 mg/dL (ref >39)
LDL Chol Calc (NIH): 72 mg/dL (ref 0–99)
Triglycerides: 99 mg/dL (ref 0–149)
VLDL Cholesterol Cal: 18 mg/dL (ref 5–40)

## 2023-04-04 LAB — TSH: TSH: 0.991 u[IU]/mL (ref 0.450–4.500)

## 2023-04-26 ENCOUNTER — Ambulatory Visit: Payer: Medicare Other | Attending: Cardiology

## 2023-04-26 DIAGNOSIS — I428 Other cardiomyopathies: Secondary | ICD-10-CM | POA: Diagnosis not present

## 2023-04-26 DIAGNOSIS — I5043 Acute on chronic combined systolic (congestive) and diastolic (congestive) heart failure: Secondary | ICD-10-CM | POA: Insufficient documentation

## 2023-04-26 LAB — ECHOCARDIOGRAM COMPLETE
Area-P 1/2: 8.82 cm2
S' Lateral: 3.9 cm

## 2023-06-16 ENCOUNTER — Other Ambulatory Visit: Payer: Self-pay | Admitting: Cardiology

## 2023-07-13 ENCOUNTER — Other Ambulatory Visit: Payer: Self-pay | Admitting: Cardiology

## 2023-10-12 LAB — LAB REPORT - SCANNED
A1c: 6
EGFR: 31

## 2023-10-18 ENCOUNTER — Ambulatory Visit: Attending: Cardiology | Admitting: Cardiology

## 2023-10-18 ENCOUNTER — Encounter: Payer: Self-pay | Admitting: Cardiology

## 2023-10-18 VITALS — BP 104/80 | HR 80 | Ht 64.0 in | Wt 123.0 lb

## 2023-10-18 DIAGNOSIS — I10 Essential (primary) hypertension: Secondary | ICD-10-CM | POA: Insufficient documentation

## 2023-10-18 DIAGNOSIS — I2510 Atherosclerotic heart disease of native coronary artery without angina pectoris: Secondary | ICD-10-CM | POA: Diagnosis present

## 2023-10-18 DIAGNOSIS — E782 Mixed hyperlipidemia: Secondary | ICD-10-CM | POA: Insufficient documentation

## 2023-10-18 NOTE — Progress Notes (Signed)
 Cardiology Office Note:    Date:  10/18/2023   ID:  [name], DOB Mar 20, 1951, MRN [id number]  PCP:  Sabas Norleen PARAS., MD  Cardiologist:  Jennifer JONELLE Crape, MD   Referring MD: Sabas Norleen PARAS., MD    ASSESSMENT:    1. Primary hypertension   2. Coronary artery disease involving native coronary artery of native heart without angina pectoris   3. Mixed hyperlipidemia    PLAN:    In order of problems listed above:  Primary prevention stressed with the patient.  Importance of compliance with diet medication stressed and patient verbalized standing. History of cardiomyopathy: Stable at this time.  Ejection fraction is better and guideline directed medical therapy is on.  CHF education was given to her extensively. Renal insufficiency: Because of this I asked her to keep herself well-hydrated in the next 1 or 2 weeks and follow-up with primary about follow-up Chem-7.  Her ejection fraction is normal so she should be able to use and handle a little more fluid at this time. Essential hypertension: Blood pressure stable and diet was emphasized. Patient will be seen in follow-up appointment in 6 months or earlier if the patient has any concerns.    Medication Adjustments/Labs and Tests Ordered: Current medicines are reviewed at length with the patient today.  Concerns regarding medicines are outlined above.  Orders Placed This Encounter  Procedures   EKG 12-Lead   No orders of the defined types were placed in this encounter.    Chief Complaint  Patient presents with   Follow-up     History of Present Illness:    [name] is a 73 y.o. female.  Patient has past medical history of cardiomyopathy, essential hypertension and mixed dyslipidemia.  She denies any problems at this time and takes care of activities of daily living.  She keeps a good track of her daily weights.  No chest pain orthopnea or PND.  At the time of my evaluation, the patient is alert awake  oriented and in no distress.  Past Medical History:  Diagnosis Date   Abnormal nuclear stress test 03/29/2020   Acute combined systolic and diastolic heart failure (HCC) 03/09/2020   Acute on chronic combined systolic and diastolic CHF (congestive heart failure) (HCC)    Atypical Angina pectoris (HCC) 03/19/2020   CAD (coronary artery disease) 04/21/2020   CKD (chronic kidney disease), stage II    Depression    Diabetes mellitus due to underlying condition with unspecified complications (HCC) 03/09/2020   Diabetes mellitus, type 2 (HCC)    DVT (deep venous thrombosis) (HCC) 07/2011   S/P coumadin x 6 months   Dyspnea on exertion 03/19/2020   HTN (hypertension)    Hyperlipidemia    Hypertension    Hypokalemia 05/08/2012   Hypotension 05/08/2012   Iron  deficiency anemia    Iron  deficiency anemia, unspecified, symptomatic with dyspnea, dizziness, and shortness of breath with activity 05/08/2012   Leukocytosis 05/08/2012   Mitral regurgitation    Nonischemic cardiomyopathy (HCC) 04/21/2020   Pericardial effusion    small by echo 07/2020   Pericarditis 11/13/2020   Pleural effusion 10/26/2020   Pleural effusion on right    Pressure injury of skin 10/27/2020   Pulmonary hypertension, unspecified (HCC) 10/27/2020   Shortness of breath 10/26/2020   Tricuspid regurgitation    Wheezing    Recently put on albuterol , no history of asthma    Past Surgical History:  Procedure Laterality Date   APPENDECTOMY  Bilateral oophrectomy     Secondary to benign cysts   colonoscopy     with polypectomy   LEFT HEART CATH AND CORONARY ANGIOGRAPHY N/A 03/29/2020   Procedure: LEFT HEART CATH AND CORONARY ANGIOGRAPHY;  Surgeon: Anner Alm ORN, MD;  Location[facility] INVASIVE CV LAB;  Service: Cardiovascular;  Laterality: N/A;   RIGHT HEART CATH N/A 10/27/2020   Procedure: RIGHT HEART CATH;  Surgeon: Cherrie Toribio SAUNDERS, MD;  Location: [facility] INVASIVE CV LAB;  Service: Cardiovascular;  Laterality: N/A;    TONSILLECTOMY      Current Medications: Current Meds  Medication Sig   Aflibercept  (EYLEA ) 2 MG/0.05ML SOLN 2 mg by Intravitreal route as directed. Every 10 weeks   aspirin  81 MG EC tablet Take 81 mg by mouth every evening.    Calcium-Magnesium-Vitamin D (CALCIUM 1200+D3 PO) Take 1 tablet by mouth daily. Calcium 1200, and 800 units Vit D   carboxymethylcellulose (REFRESH PLUS) 0.5 % SOLN Place 1 drop into both eyes 3 (three) times daily as needed for dry eyes (dry eye).   empagliflozin  (JARDIANCE ) 10 MG TABS tablet Take 1 tablet (10 mg total) by mouth daily.   ENTRESTO  24-26 MG TAKE 1 TABLET BY MOUTH TWICE A DAY   ferrous sulfate  325 (65 FE) MG tablet Take 325 mg by mouth 4 (four) times a week.   metoprolol  succinate (TOPROL -XL) 25 MG 24 hr tablet TAKE 1 TABLET BY MOUTH EVERY DAY WITH OR IMMEDIATELY FOLLOWING A MEAL   Multiple Vitamins-Minerals (PRESERVISION AREDS 2+MULTI VIT) CAPS Take 1 Dose by mouth daily.   nitroGLYCERIN  (NITROSTAT ) 0.4 MG SL tablet Place 0.4 mg under the tongue every 5 (five) minutes as needed for chest pain.   simvastatin  (ZOCOR ) 20 MG tablet Take 20 mg by mouth every evening.    spironolactone  (ALDACTONE ) 25 MG tablet TAKE 1 TABLET (25 MG TOTAL) BY MOUTH DAILY.   torsemide  (DEMADEX ) 20 MG tablet Take 1.5 tablets (30 mg total) by mouth daily.     Allergies:   Patient has no known allergies.   Social History   Socioeconomic History   Marital status: Married    Spouse name: Shenetta Schnackenberg   Number of children: 2   Years of education: Not on file   Highest education level: Not on file  Occupational History   Occupation: Production manager for a company    Employer: [employer]  Tobacco Use   Smoking status: Former    Current packs/day: 0.00    Average packs/day: 0.5 packs/day for 40.0 years (20.0 ttl pk-yrs)    Types: Cigarettes    Start date: 07/1980    Quit date: 07/2020    Years since quitting: 3.2   Smokeless tobacco: Never  Substance and Sexual Activity    Alcohol  use: Yes    Alcohol /week: 15.0 standard drinks of alcohol     Types: 15 Cans of beer per week   Drug use: No   Sexual activity: Never  Other Topics Concern   Not on file  Social History Narrative   Married.  Lives in husband here in [place].  Normally independent of ADLs.   Social Drivers of Corporate investment banker Strain: Not on file  Food Insecurity: Not on file  Transportation Needs: Not on file  Physical Activity: Not on file  Stress: Not on file  Social Connections: Not on file     Family History: The patient's family history includes Alcoholism in her father; Breast cancer in her mother; CAD in her paternal grandmother; COPD  in her mother; Diabetes in her maternal grandmother and paternal grandmother; Liver disease in her father; Stroke in her maternal grandfather and paternal grandfather; Uterine cancer in her mother.  ROS:   Please see the history of present illness.    All other systems reviewed and are negative.  EKGs/Labs/Other Studies Reviewed:    The following studies were reviewed today: .SABRAEKG Interpretation Date/Time:  Thursday October 18 2023 09:48:27 EDT Ventricular Rate:  80 PR Interval:  156 QRS Duration:  84 QT Interval:  404 QTC Calculation: 465 R Axis:   55  Text Interpretation: Sinus rhythm with frequent Premature ventricular complexes Nonspecific ST and T wave abnormality Abnormal ECG When compared with ECG of 10-Jan-2021 16:00, Fusion complexes are no longer Present Premature ventricular complexes are now Present Criteria for Septal infarct are no longer Present T wave inversion no longer evident in Inferior leads T wave inversion no longer evident in Anterolateral leads Confirmed by Edwyna Backers [phone]) on 10/18/2023 10:09:51 AM     Recent Labs: 04/03/2023: ALT 12; BUN 30; Creatinine, Ser 1.24; Hemoglobin 16.5; Platelets 178; Potassium 4.6; Sodium 141; TSH 0.991  Recent Lipid Panel    Component Value Date/Time   CHOL 141 04/03/2023  1000   TRIG 99 04/03/2023 1000   HDL 51 04/03/2023 1000   CHOLHDL 2.8 04/03/2023 1000   LDLCALC 72 04/03/2023 1000    Physical Exam:    VS:  BP 104/80   Pulse 80   Ht 5' 4 (1.626 m)   Wt 123 lb (55.8 kg)   SpO2 96%   BMI 21.11 kg/m     Wt Readings from Last 3 Encounters:  10/18/23 123 lb (55.8 kg)  04/03/23 135 lb 9.6 oz (61.5 kg)  07/07/22 131 lb (59.4 kg)     GEN: Patient is in no acute distress HEENT: Normal NECK: No JVD; No carotid bruits LYMPHATICS: No lymphadenopathy CARDIAC: Hear sounds regular, 2/6 systolic murmur at the apex. RESPIRATORY:  Clear to auscultation without rales, wheezing or rhonchi  ABDOMEN: Soft, non-tender, non-distended MUSCULOSKELETAL:  No edema; No deformity  SKIN: Warm and dry NEUROLOGIC:  Alert and oriented x 3 PSYCHIATRIC:  Normal affect   Signed, Backers JONELLE Edwyna, MD  10/18/2023 10:16 AM    [facility]

## 2023-10-18 NOTE — Patient Instructions (Signed)
 Medication Instructions:  No medication changes were made at this visit. Continue current regimen.   *If you need a refill on your cardiac medications before your next appointment, please call your pharmacy*  Lab Work: None ordered today. If you have labs (blood work) drawn today and your tests are completely normal, you will receive your results only by: MyChart Message (if you have MyChart) OR A paper copy in the mail If you have any lab test that is abnormal or we need to change your treatment, we will call you to review the results.  Testing/Procedures: None ordered today.  Follow-Up: At [facility], you and your health needs are our priority.  As part of our continuing mission to provide you with exceptional heart care, our providers are all part of one team.  This team includes your primary Cardiologist (physician) and Advanced Practice Providers or APPs (Physician Assistants and Nurse Practitioners) who all work together to provide you with the care you need, when you need it.  Your next appointment:   9 month(s)  Provider:   Jennifer Crape, MD

## 2023-10-31 ENCOUNTER — Other Ambulatory Visit (HOSPITAL_BASED_OUTPATIENT_CLINIC_OR_DEPARTMENT_OTHER): Payer: Self-pay | Admitting: Family Medicine

## 2023-10-31 DIAGNOSIS — R062 Wheezing: Secondary | ICD-10-CM

## 2023-12-03 ENCOUNTER — Emergency Department (HOSPITAL_COMMUNITY): Admission: EM | Admit: 2023-12-03 | Discharge: 2023-12-03 | Disposition: A | Discharge status: Home / Self Care

## 2023-12-03 ENCOUNTER — Emergency Department (HOSPITAL_COMMUNITY)

## 2023-12-03 ENCOUNTER — Other Ambulatory Visit: Payer: Self-pay

## 2023-12-03 ENCOUNTER — Emergency Department (EMERGENCY_DEPARTMENT_HOSPITAL): Admit: 2023-12-03 | Discharge: 2023-12-03 | Disposition: A | Discharge status: Home / Self Care

## 2023-12-03 DIAGNOSIS — I10 Essential (primary) hypertension: Secondary | ICD-10-CM | POA: Diagnosis not present

## 2023-12-03 DIAGNOSIS — M79661 Pain in right lower leg: Secondary | ICD-10-CM

## 2023-12-03 DIAGNOSIS — Z7982 Long term (current) use of aspirin: Secondary | ICD-10-CM | POA: Insufficient documentation

## 2023-12-03 DIAGNOSIS — M25552 Pain in left hip: Secondary | ICD-10-CM | POA: Diagnosis not present

## 2023-12-03 DIAGNOSIS — M25551 Pain in right hip: Secondary | ICD-10-CM | POA: Diagnosis present

## 2023-12-03 MED ORDER — LIDOCAINE 5 % EX PTCH
1.0000 | MEDICATED_PATCH | CUTANEOUS | Status: DC
  Administered 2023-12-03: 1 via TRANSDERMAL
  Filled 2023-12-03: qty 1

## 2023-12-03 MED ORDER — ACETAMINOPHEN 325 MG PO TABS
975.0000 mg | ORAL_TABLET | Freq: Once | ORAL | Status: AC
  Administered 2023-12-03: 975 mg via ORAL
  Filled 2023-12-03: qty 3

## 2023-12-03 MED ORDER — LIDOCAINE 5 % EX PTCH: 1.0000 | MEDICATED_PATCH | CUTANEOUS | 0 refills | Status: AC

## 2023-12-03 NOTE — Discharge Instructions (Addendum)
 Please follow up with orthopedic surgery.    For pain, you can take 1000 mg of Tylenol  or 1 g of Tylenol  every 6-8 hours.  Do not exceed more than 4000 mg or 4 g in a 24-hour period.  You can also use the lidocaine  patches for pain.    As we discussed, there are some concerning findings on your femur.  You need MRI of this femur.  Please discuss this with your primary care physician as well as the orthopedic surgeon.  They can order MRI outpatient.  Need to make sure this is not cancer and is likely just some bone reaction due to old trauma.  Is important that you follow-up for this.  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  This could be contributing to your pain.  Please discuss this with orthopedic surgery.  The name and number to the orthopedic group as above.

## 2023-12-03 NOTE — ED Triage Notes (Signed)
 Patient to ED by POV from home with c/o r leg pain states she was given prednisone with no relief. She denies fall or injury to cause pain. She now c/o left leg pain. She states she has become weaker and now uses a cane to ambulate.

## 2023-12-03 NOTE — ED Provider Notes (Signed)
 [place] EMERGENCY DEPARTMENT AT [facility] Provider Note   CSN: [id number] Arrival date & time: 12/03/23  1148     Patient presents with: No chief complaint on file.   [name] is a 73 y.o. female.   HPI    Patient presents because of bilateral hip pain as well as bilateral knee pain.  Patient states that she followed up with PCP because of right hip pain and was given a steroid taper.  This was back in June.  Started with increase in left hip pain.  Patient states that she has had difficulty walking because of increasing pain.  No back pain.  No bowel bladder incontinence.  No saddle anesthesia.  No numbness or tingling wear.  Patient states it just hurts and bilateral hips, bilateral thighs and bilateral knees when ambulating.  Because of this, she has been ambulating very little here recently.  No falls.  Has not followed up with physical therapy.  Has not followed up with orthopedics.   Previous medical history reviewed : Patient last seen in the ED in 2022.  Presented because of near syncope.  Also up with cardiology outpatient.  Coronary disease involving native coronary artery.  Primary hypertension.   Prior to Admission medications   Medication Sig Start Date End Date Taking? Authorizing Provider  lidocaine  (LIDODERM ) 5 % Place 1 patch onto the skin daily. Remove & Discard patch within 12 hours or as directed by MD 12/03/23  Yes Simon Lavonia SAILOR, MD  Aflibercept  (EYLEA ) 2 MG/0.05ML SOLN 2 mg by Intravitreal route as directed. Every 10 weeks    [provider]  aspirin  81 MG EC tablet Take 81 mg by mouth every evening.     [provider]  Calcium-Magnesium-Vitamin D (CALCIUM 1200+D3 PO) Take 1 tablet by mouth daily. Calcium 1200, and 800 units Vit D 07/04/23   [provider]  carboxymethylcellulose (REFRESH PLUS) 0.5 % SOLN Place 1 drop into both eyes 3 (three) times daily as needed for dry eyes (dry eye).    [provider]  empagliflozin  (JARDIANCE ) 10 MG TABS tablet Take 1 tablet (10 mg total) by mouth daily. 10/30/20   Danton Reyes DASEN, MD  ENTRESTO  24-26 MG TAKE 1 TABLET BY MOUTH TWICE A DAY 01/01/23   Revankar, Jennifer SAUNDERS, MD  ferrous sulfate  325 (65 FE) MG tablet Take 325 mg by mouth 4 (four) times a week.    [provider]  metoprolol  succinate (TOPROL -XL) 25 MG 24 hr tablet TAKE 1 TABLET BY MOUTH EVERY DAY WITH OR IMMEDIATELY FOLLOWING A MEAL 12/19/22   Revankar, Jennifer SAUNDERS, MD  Multiple Vitamins-Minerals (PRESERVISION AREDS 2+MULTI VIT) CAPS Take 1 Dose by mouth daily.    [provider]  nitroGLYCERIN  (NITROSTAT ) 0.4 MG SL tablet Place 0.4 mg under the tongue every 5 (five) minutes as needed for chest pain.    [provider]  simvastatin  (ZOCOR ) 20 MG tablet Take 20 mg by mouth every evening.     [provider]  spironolactone  (ALDACTONE ) 25 MG tablet TAKE 1 TABLET (25 MG TOTAL) BY MOUTH DAILY. 07/13/23   Revankar, Jennifer SAUNDERS, MD  torsemide  (DEMADEX ) 20 MG tablet Take 1.5 tablets (30 mg total) by mouth daily. 06/18/23   Revankar, Jennifer SAUNDERS, MD    Allergies: Patient has no known allergies.    Review of Systems  Constitutional:  Negative for chills and fever.  HENT:  Negative for ear pain and sore throat.   Eyes:  Negative for pain and visual disturbance.  Respiratory:  Negative for cough and shortness of breath.   Cardiovascular:  Negative for chest pain and palpitations.  Gastrointestinal:  Negative for abdominal pain and vomiting.  Genitourinary:  Negative for dysuria and hematuria.  Musculoskeletal:  Negative for arthralgias and back pain.  Skin:  Negative for color change and rash.  Neurological:  Negative for seizures and syncope.  All other systems reviewed and are negative.   Updated Vital Signs BP (!) 113/96   Pulse 81   Temp 98.3 F (36.8 C) (Oral)   Resp 16   Ht 5' 4 (1.626 m)   Wt 53.1 kg   SpO2 95%   BMI 20.08 kg/m   Physical Exam Vitals and  nursing note reviewed.  Constitutional:      General: She is not in acute distress.    Appearance: She is well-developed.  HENT:     Head: Normocephalic and atraumatic.  Eyes:     Conjunctiva/sclera: Conjunctivae normal.  Cardiovascular:     Rate and Rhythm: Normal rate and regular rhythm.     Heart sounds: No murmur heard. Pulmonary:     Effort: Pulmonary effort is normal. No respiratory distress.     Breath sounds: Normal breath sounds.  Abdominal:     Palpations: Abdomen is soft.     Tenderness: There is no abdominal tenderness.  Musculoskeletal:        General: No swelling.     Cervical back: Neck supple.       Legs:  Skin:    General: Skin is warm and dry.     Capillary Refill: Capillary refill takes less than 2 seconds.  Neurological:     Mental Status: She is alert.  Psychiatric:        Mood and Affect: Mood normal.     (all labs ordered are listed, but only abnormal results are displayed) Labs Reviewed - No data to display  EKG: None  Radiology: DG Hips Bilat W or Wo Pelvis 3-4 Views Result Date: 12/03/2023 CLINICAL DATA:  Pain. EXAM: DG HIP (WITH OR WITHOUT PELVIS) 3-4V BILAT COMPARISON:  None Available. FINDINGS: There is no evidence of hip fracture or dislocation. Femoral heads are seated within the acetabula. Mild osteoarthritis of the bilateral hips. Sacroiliac joints and pubic symphysis are anatomically aligned. IMPRESSION: 1. No acute osseous abnormality. 2. Mild osteoarthritis of the bilateral hips. Electronically Signed   By: Harrietta Sherry M.D.   On: 12/03/2023 17:06   DG Knee Complete 4 Views Right Result Date: 12/03/2023 CLINICAL DATA:  Pain.  Difficulty walking. EXAM: RIGHT KNEE - COMPLETE 4+ VIEW COMPARISON:  None Available. FINDINGS: No evidence of acute fracture, dislocation, or sizable joint effusion. Partially visualized cortical thickening of the right medial femoral diaphysis with indistinctness of the underlying corticomedullary junction. Mild  medial femorotibial compartment joint space narrowing. Soft tissues are unremarkable. IMPRESSION: 1. Partially visualized cortical thickening of the medial right femoral diaphysis with indistinctness of the underlying corticomedullary junction. These findings are nonspecific and could reflect sequela of prior trauma such as stress injury, however, neoplasm cannot be entirely excluded. Recommend correlation with prior history. Consider further evaluation with MRI of the right femur. 2. No acute osseous abnormality. 3. Mild medial femorotibial compartment joint space narrowing. Electronically Signed   By: Harrietta Sherry M.D.   On: 12/03/2023 17:05   DG Knee Complete 4 Views Left Result Date: 12/03/2023 CLINICAL DATA:  Pain.  Difficulty walking. EXAM: LEFT KNEE - COMPLETE  4+ VIEW COMPARISON:  None Available. FINDINGS: No evidence of acute fracture, dislocation, or sizable joint effusion. Mild medial femorotibial compartment joint space narrowing. Soft tissues are unremarkable. IMPRESSION: 1. No acute osseous abnormality. 2. Mild medial femorotibial compartment joint space narrowing. Electronically Signed   By: Harrietta Sherry M.D.   On: 12/03/2023 16:53   VAS US  LOWER EXTREMITY VENOUS (DVT) (ONLY [facility] & [facility]) Result Date: 12/03/2023  Lower Venous DVT Study Patient Name:  Rhaelyn E Sahagun  Date of Exam:   12/03/2023 Medical Rec #: [id number]           Accession #:    [id number] Date of Birth: Feb 03, 1951            Patient Gender: F Patient Age:   75 years Exam Location:  [facility] Procedure:      VAS US  LOWER EXTREMITY VENOUS (DVT) Referring Phys: LAVONIA PAT --------------------------------------------------------------------------------  Indications: Pain.  Risk Factors: None identified. Comparison Study: No prior studies. Performing Technologist: Cordella Collet RVT  Examination Guidelines: A complete evaluation includes B-mode imaging, spectral Doppler, color Doppler, and power Doppler as needed of all  accessible portions of each vessel. Bilateral testing is considered an integral part of a complete examination. Limited examinations for reoccurring indications may be performed as noted. The reflux portion of the exam is performed with the patient in reverse Trendelenburg.  +---------+---------------+---------+-----------+----------+--------------+ RIGHT    CompressibilityPhasicitySpontaneityPropertiesThrombus Aging +---------+---------------+---------+-----------+----------+--------------+ CFV      Full           Yes      Yes                                 +---------+---------------+---------+-----------+----------+--------------+ SFJ      Full                                                        +---------+---------------+---------+-----------+----------+--------------+ FV Prox  Full                                                        +---------+---------------+---------+-----------+----------+--------------+ FV Mid   Full                                                        +---------+---------------+---------+-----------+----------+--------------+ FV DistalFull                                                        +---------+---------------+---------+-----------+----------+--------------+ PFV      Full                                                        +---------+---------------+---------+-----------+----------+--------------+  POP      Full           Yes      Yes                                 +---------+---------------+---------+-----------+----------+--------------+ PTV      Full                                                        +---------+---------------+---------+-----------+----------+--------------+ PERO     Full                                                        +---------+---------------+---------+-----------+----------+--------------+   +---------+---------------+---------+-----------+----------+--------------+  LEFT     CompressibilityPhasicitySpontaneityPropertiesThrombus Aging +---------+---------------+---------+-----------+----------+--------------+ CFV      Full           Yes      Yes                                 +---------+---------------+---------+-----------+----------+--------------+ SFJ      Full                                                        +---------+---------------+---------+-----------+----------+--------------+ FV Prox  Full                                                        +---------+---------------+---------+-----------+----------+--------------+ FV Mid   Full                                                        +---------+---------------+---------+-----------+----------+--------------+ FV DistalFull                                                        +---------+---------------+---------+-----------+----------+--------------+ PFV      Full                                                        +---------+---------------+---------+-----------+----------+--------------+ POP      Full           Yes      Yes                                 +---------+---------------+---------+-----------+----------+--------------+  PTV      Full                                                        +---------+---------------+---------+-----------+----------+--------------+ PERO     Full                                                        +---------+---------------+---------+-----------+----------+--------------+     Summary: RIGHT: - There is no evidence of deep vein thrombosis in the lower extremity.  - No cystic structure found in the popliteal fossa.  LEFT: - There is no evidence of deep vein thrombosis in the lower extremity.  - No cystic structure found in the popliteal fossa.  *See table(s) above for measurements and observations. Electronically signed by Fonda Rim on 12/03/2023 at 4:46:32 PM.    Final      Procedures    Medications Ordered in the ED  acetaminophen  (TYLENOL ) tablet 975 mg (has no administration in time range)  lidocaine  (LIDODERM ) 5 % 1 patch (has no administration in time range)                                    Medical Decision Making Amount and/or Complexity of Data Reviewed Radiology: ordered.  Risk OTC drugs. Prescription drug management.      Patient presents because of bilateral hip pain as well as bilateral knee pain.  Patient states that she followed up with PCP because of right hip pain and was given a steroid taper.  This was back in June.  Started with increase in left hip pain.  Patient states that she has had difficulty walking because of increasing pain.  No back pain.  No bowel bladder incontinence.  No saddle anesthesia.  No numbness or tingling wear.  Patient states it just hurts and bilateral hips, bilateral thighs and bilateral knees when ambulating.  Because of this, she has been ambulating very little here recently.  No falls.  Has not followed up with physical therapy.  Has not followed up with orthopedics.  Previous medical history reviewed : Patient last seen in the ED in 2022.  Presented because of near syncope.  Also up with cardiology outpatient.  Coronary disease involving native coronary artery.  Primary hypertension.   On exam, patient stable.  A and O x 3 GCS 15.  Denies all complaints other than bilateral hip and knee pain.  Patient has slightly decreased rinks at left hip with flexion because of pain.  5 of 5 strength right hip.  Neurovascularly intact.  Sensation intact in bilateral lower extremities.  2+ dorsal pedal pulses bilaterally.   Given nonspecific bilateral pain and patient thigh regions, did obtain DVT ultrasound.  This was negative.  No DVT was noted.  Also obtained pelvis as well as bilateral hip and bilateral knee x-rays.  Pelvic x-ray showed mild osteoarthritis of the bilateral hips.  Could be contributing.  Patient also has a  ill-defined cortical thickening in the medial right femoral diaphysis with indistinction of the underlying cortical medullary junction.  Unclear whether or not this  could be old trauma versus lytic lesion.  Try to order MRI of the hip unfortunately, MRI was gone for the night.  Do not think there is any indication for transfer for emergent MRI do think she needs this followed closely outpatient.  Did refer her to orthopedic surgery.  She will also discuss with her PCP.  Explained to her at length that she needs this MRI to make sure there is no underlying lytic lesion.  Otherwise, follow-up with orthopedic surgery for chronic pain of bilateral hip area.  Recommended Tylenol  as well as lidocaine  patches.  She does have some difficulty walking because of bilateral hip pain and weakness.  Not immediate fall risk but could become increase in fall risk due to deconditioning.  Therefore, I did place order for home physical therapy.        Final diagnoses:  Bilateral hip pain    ED Discharge Orders          Ordered    Home Health        12/03/23 1751    Face-to-face encounter (required for Medicare/Medicaid patients)       Comments: I Lavonia LOISE Pat certify that this patient is under my care and that I, or a nurse practitioner or physician's assistant working with me, had a face-to-face encounter that meets the physician face-to-face encounter requirements with this patient on 12/03/2023. The encounter with the patient was in whole, or in part for the following medical condition(s) which is the primary reason for home health care (List medical condition): Bilateral hip pain worse with ambulation and deconditioning   12/03/23 1751    lidocaine  (LIDODERM ) 5 %  Every 24 hours        12/03/23 1807               Pat Lavonia LOISE, MD 12/03/23 1815

## 2023-12-03 NOTE — Progress Notes (Signed)
 Bilateral lower extremity venous duplex has been completed. Preliminary results can be found in CV Proc through chart review.  Results were given to Dr. Simon.  12/03/23 4:32 PM Cathlyn Collet RVT

## 2023-12-15 ENCOUNTER — Emergency Department (HOSPITAL_COMMUNITY)
Admission: EM | Admit: 2023-12-15 | Discharge: 2023-12-15 | Attending: Emergency Medicine | Admitting: Emergency Medicine

## 2023-12-15 ENCOUNTER — Emergency Department (HOSPITAL_COMMUNITY)

## 2023-12-15 ENCOUNTER — Encounter (HOSPITAL_COMMUNITY): Payer: Self-pay

## 2023-12-15 DIAGNOSIS — I509 Heart failure, unspecified: Secondary | ICD-10-CM | POA: Insufficient documentation

## 2023-12-15 DIAGNOSIS — M84452A Pathological fracture, left femur, initial encounter for fracture: Secondary | ICD-10-CM | POA: Insufficient documentation

## 2023-12-15 DIAGNOSIS — M898X9 Other specified disorders of bone, unspecified site: Secondary | ICD-10-CM | POA: Insufficient documentation

## 2023-12-15 DIAGNOSIS — E1122 Type 2 diabetes mellitus with diabetic chronic kidney disease: Secondary | ICD-10-CM | POA: Diagnosis not present

## 2023-12-15 DIAGNOSIS — N189 Chronic kidney disease, unspecified: Secondary | ICD-10-CM | POA: Diagnosis not present

## 2023-12-15 DIAGNOSIS — I5020 Unspecified systolic (congestive) heart failure: Secondary | ICD-10-CM | POA: Insufficient documentation

## 2023-12-15 DIAGNOSIS — M25551 Pain in right hip: Secondary | ICD-10-CM | POA: Insufficient documentation

## 2023-12-15 DIAGNOSIS — I130 Hypertensive heart and chronic kidney disease with heart failure and stage 1 through stage 4 chronic kidney disease, or unspecified chronic kidney disease: Secondary | ICD-10-CM | POA: Diagnosis not present

## 2023-12-15 DIAGNOSIS — M25552 Pain in left hip: Secondary | ICD-10-CM | POA: Insufficient documentation

## 2023-12-15 DIAGNOSIS — N1831 Chronic kidney disease, stage 3a: Secondary | ICD-10-CM | POA: Insufficient documentation

## 2023-12-15 DIAGNOSIS — Z79899 Other long term (current) drug therapy: Secondary | ICD-10-CM | POA: Diagnosis not present

## 2023-12-15 DIAGNOSIS — Z7982 Long term (current) use of aspirin: Secondary | ICD-10-CM | POA: Diagnosis not present

## 2023-12-15 LAB — BASIC METABOLIC PANEL WITH GFR
Anion gap: 12 (ref 5–15)
BUN: 29 mg/dL — ABNORMAL HIGH (ref 8–23)
CO2: 21 mmol/L — ABNORMAL LOW (ref 22–32)
Calcium: 9 mg/dL (ref 8.9–10.3)
Chloride: 101 mmol/L (ref 98–111)
Creatinine, Ser: 1.2 mg/dL — ABNORMAL HIGH (ref 0.44–1.00)
GFR, Estimated: 48 mL/min — ABNORMAL LOW (ref >60)
Glucose, Bld: 111 mg/dL — ABNORMAL HIGH (ref 70–99)
Potassium: 4.7 mmol/L (ref 3.5–5.1)
Sodium: 134 mmol/L — ABNORMAL LOW (ref 135–145)

## 2023-12-15 LAB — CBC WITH DIFFERENTIAL/PLATELET
Abs Immature Granulocytes: 0.03 K/uL (ref 0.00–0.07)
Basophils Absolute: 0.1 K/uL (ref 0.0–0.1)
Basophils Relative: 1 %
Eosinophils Absolute: 0.3 K/uL (ref 0.0–0.5)
Eosinophils Relative: 3 %
HCT: 44.4 % (ref 36.0–46.0)
Hemoglobin: 14.7 g/dL (ref 12.0–15.0)
Immature Granulocytes: 0 %
Lymphocytes Relative: 12 %
Lymphs Abs: 1 K/uL (ref 0.7–4.0)
MCH: 31.7 pg (ref 26.0–34.0)
MCHC: 33.1 g/dL (ref 30.0–36.0)
MCV: 95.7 fL (ref 80.0–100.0)
Monocytes Absolute: 0.6 K/uL (ref 0.1–1.0)
Monocytes Relative: 7 %
Neutro Abs: 6.4 K/uL (ref 1.7–7.7)
Neutrophils Relative %: 77 %
Platelets: 184 K/uL (ref 150–400)
RBC: 4.64 MIL/uL (ref 3.87–5.11)
RDW: 13.5 % (ref 11.5–15.5)
WBC: 8.5 K/uL (ref 4.0–10.5)
nRBC: 0 % (ref 0.0–0.2)

## 2023-12-15 MED ORDER — LACTATED RINGERS IV BOLUS
1000.0000 mL | Freq: Once | INTRAVENOUS | Status: AC
  Administered 2023-12-15: 1000 mL via INTRAVENOUS

## 2023-12-15 MED ORDER — OXYCODONE-ACETAMINOPHEN 5-325 MG PO TABS
1.0000 | ORAL_TABLET | Freq: Once | ORAL | Status: AC
  Administered 2023-12-15: 1 via ORAL
  Filled 2023-12-15: qty 1

## 2023-12-15 MED ORDER — FENTANYL CITRATE PF 50 MCG/ML IJ SOSY
25.0000 ug | PREFILLED_SYRINGE | INTRAMUSCULAR | Status: DC | PRN
  Administered 2023-12-15: 25 ug via INTRAVENOUS
  Filled 2023-12-15 (×2): qty 1

## 2023-12-15 NOTE — ED Provider Notes (Signed)
 [facility] Provider Note   CSN: [id number] Arrival date & time: 12/15/23  1145     History Chief Complaint  Patient presents with   Hip Pain     Hip Pain   HPI: SHERILYN WINDHORST is a 73 y.o. female with history pertinent for T2DM, HTN, iron  deficiency anemia, prior DVT not anticoagulation, CHF, pulmonary hypertension, CKD who presents complaining of left greater than right hip pain. Patient arrived via EMS accompanied by her husband, Charlena.  History provided by patient and spouse/partner.  No interpreter required during this encounter.  Patient reports that she is here for left greater than right hip pain.  Reports that she was recently seen in the emergency department on 8/18 for bilateral hip and knee pain.  Reports at that time she was worked up for DVT.  Workup was negative.  Reports that she was prescribed lidocaine  patches, educated on Tylenol  use, and referred to orthopedic surgery.  Reports that she has a appointment on September 17.  Reports that right-sided hip pain has been stable, however she has had worsening left-sided hip pain over the past 24 hours.  Reports that she is usually a side sleeper, however had to sleep on her back due to her hip pain overnight, and this morning she was unable to ambulate due to the pain.  Her husband expresses significant concern that she is unable to get around the house because they have stairs and he is frustrated with the delay in the time to follow-up.  Patient reports that pain is worse with active and passive range of motion, worse with active more so than passive.  Reports that when a episode pain comes on she has associated dizziness and nausea.  When the pain improves her dizziness and nausea resolves.  She does not become dizzy or nauseous at any other time such as with movement, standing, at rest.  Denies fever, chills, chest pain, shortness of breath vomiting diarrhea, abdominal pain.  Denies  any changes in activity, denies recent trauma or falls.  Patient localizes her pain primarily to her groin, and it is worse with active range of motion passive range of motion  Patient's recorded medical, surgical, social, medication list and allergies were reviewed in the Snapshot window as part of the initial history.   Prior to Admission medications   Medication Sig Start Date End Date Taking? Authorizing Provider  acetaminophen  (TYLENOL ) 500 MG tablet Take 500 mg by mouth every 6 (six) hours as needed for mild pain (pain score 1-3) or moderate pain (pain score 4-6).   Yes [provider]  Aflibercept  (EYLEA ) 2 MG/0.05ML SOLN 2 mg by Intravitreal route as directed. Every 10 weeks   Yes [provider]  aspirin  81 MG EC tablet Take 81 mg by mouth every evening.    Yes [provider]  Calcium-Magnesium-Vitamin D (CALCIUM 1200+D3 PO) Take 1 tablet by mouth daily. Calcium 1200, and 800 units Vit D 07/04/23  Yes [provider]  carboxymethylcellulose (REFRESH PLUS) 0.5 % SOLN Place 1 drop into both eyes 3 (three) times daily as needed for dry eyes (dry eye).   Yes [provider]  ENTRESTO  24-26 MG TAKE 1 TABLET BY MOUTH TWICE A DAY 01/01/23  Yes Revankar, Jennifer SAUNDERS, MD  FARXIGA 5 MG TABS tablet Take 5 mg by mouth in the morning. 10/30/23  Yes [provider]  ferrous sulfate  325 (65 FE) MG tablet Take 325 mg by mouth  3 (three) times a week. Monday, Wednesday, and Friday   Yes [provider]  lidocaine  (LIDODERM ) 5 % Place 1 patch onto the skin daily. Remove & Discard patch within 12 hours or as directed by MD 12/03/23  Yes Lemly, Tatum N, MD  metoprolol  succinate (TOPROL -XL) 25 MG 24 hr tablet TAKE 1 TABLET BY MOUTH EVERY DAY WITH OR IMMEDIATELY FOLLOWING A MEAL Patient taking differently: Take 25 mg by mouth daily. 12/19/22  Yes Revankar, Jennifer SAUNDERS, MD  Multiple Vitamins-Minerals (PRESERVISION AREDS 2+MULTI VIT) CAPS Take 1 Dose by mouth in the  morning and at bedtime.   Yes [provider]  nitroGLYCERIN  (NITROSTAT ) 0.4 MG SL tablet Place 0.4 mg under the tongue every 5 (five) minutes as needed for chest pain.   Yes [provider]  simvastatin  (ZOCOR ) 20 MG tablet Take 20 mg by mouth every evening.    Yes [provider]  spironolactone  (ALDACTONE ) 25 MG tablet TAKE 1 TABLET (25 MG TOTAL) BY MOUTH DAILY. 07/13/23  Yes Revankar, Jennifer SAUNDERS, MD  torsemide  (DEMADEX ) 20 MG tablet Take 1.5 tablets (30 mg total) by mouth daily. Patient taking differently: Take 20 mg by mouth daily. 06/18/23  Yes Revankar, Jennifer SAUNDERS, MD     Allergies: Patient has no known allergies.   Review of Systems   ROS as per HPI  Physical Exam Updated Vital Signs BP 131/65   Pulse 78   Temp 97.8 F (36.6 C)   Resp 18   SpO2 98%  Physical Exam Vitals and nursing note reviewed.  Constitutional:      General: She is not in acute distress.    Appearance: Normal appearance.  HENT:     Head: Normocephalic and atraumatic.  Eyes:     Extraocular Movements: Extraocular movements intact.  Cardiovascular:     Rate and Rhythm: Normal rate.     Pulses: Normal pulses.  Pulmonary:     Effort: Pulmonary effort is normal.  Musculoskeletal:     Right hip: Normal range of motion. Normal strength (5/5 in hip flexors and extensors).     Left hip: No deformity. Decreased range of motion (Decreased active and passive ROM due to pain). Decreased strength (3+/5 in flexors and extensors).     Right ankle: Normal range of motion. Normal pulse.     Left ankle: Normal range of motion. Normal pulse.     Comments: 5/5 strength in ankle flexors and extensors bilaterally  Skin:    General: Skin is warm and dry.     Capillary Refill: Capillary refill takes less than 2 seconds.  Neurological:     General: No focal deficit present.     Mental Status: She is alert and oriented to person, place, and time.     ED Course/ Medical Decision Making/ A&P Clinical  Course as of 12/15/23 2041  Sat Dec 15, 2023  1738 NWB [LS]    Clinical Course User Index [LS] Rogelia Jerilynn RAMAN, MD    Procedures Procedures   Medications Ordered in ED Medications  fentaNYL  (SUBLIMAZE ) injection 25 mcg (25 mcg Intravenous Given 12/15/23 2030)  lactated ringers  bolus 1,000 mL (0 mLs Intravenous Stopped 12/15/23 1529)  oxyCODONE -acetaminophen  (PERCOCET/ROXICET) 5-325 MG per tablet 1 tablet (1 tablet Oral Given 12/15/23 1351)    Medical Decision Making:   AQSA SENSABAUGH is a 73 y.o. female who presents for left greater than right as per above.  Physical exam is pertinent for decreased active and passive range of motion  and strength in left hip flexors and extensors  The differential includes but is not limited to strain, sprain, effusion, fracture, dislocation, arthritis, septic arthritis  Independent historian: Spouse/partner  External data reviewed: Notes: Reviewed notes from prior ED visit, patient was evaluated for bilateral hip and knee pain at that time, found to have negative DVT workup, had abnormal medial right femoral diaphysis cortical thickening for which MRI was considered, however unavailable.  Patient was referred to orthopedic surgery for follow-up.  Labs: Reviewed patient's prior BMP for baseline kidney function.  Radiology: Personally reviewed patient's prior pelvic and hip x-rays for direct comparison of fracture, suspect trochanter fracture was not present on 8/18  Labs: Ordered, Independent interpretation, and Details: CBC without leukocytosis, anemia, thrombocytopenia.  BMP without emergent electrolyte derangement, elevation of creatinine and BUN are at baseline for patient on comparison to prior labs  Radiology: Ordered, Independent interpretation, and Details: Personally reviewed x-rays of the pelvis, I do appreciate cortical irregularity of the lesser trochanter of the left femur concerning for avulsion fracture.  I do not appreciate any  displaced fracture or dislocation of the pelvis.  On x-ray of the femur, I do not appreciate displaced fracture or dislocation outside of the lesser trochanter.  On x-rays of the knee, I do not appreciate displaced fracture or dislocation DG Knee Complete 4 Views Left Result Date: 12/15/2023 EXAM: 4 VIEW(S) XRAY OF THE LEFT KNEE 12/15/2023 01:50:00 PM COMPARISON: None available. CLINICAL HISTORY: Left hip pain, eval for fx. FINDINGS: BONES AND JOINTS: No acute fracture. Lucent lesion in proximal tibial diaphysis. No joint dislocation. No significant joint effusion. No significant degenerative changes. SOFT TISSUES: The soft tissues are unremarkable. IMPRESSION: 1. No acute fracture. 2. Lucent lesion in proximal tibial diaphysis. In concert with the lesion in the proximal femur, this is concerning for metastatic disease. Electronically signed by: Lonni Necessary MD 12/15/2023 02:50 PM EDT RP Workstation: [id number]   DG Femur Portable Min 2 Views Left Result Date: 12/15/2023 EXAM: 2 VIEW(S) XRAY OF THE LEFT FEMUR 12/15/2023 01:50:00 PM COMPARISON: Pelvis and hip radiographs 12/03/2023. CLINICAL HISTORY: Left hip pain, eval for fx. FINDINGS: BONES AND JOINTS: Lytic lesion within the proximal left femur with pathologic fracture of the lesser trochanter. No other focal lesions in the femur. SOFT TISSUES: The soft tissues are unremarkable. IMPRESSION: 1. Lytic lesion within the proximal left femur with pathologic fracture of the lesser trochanter. Electronically signed by: Lonni Necessary MD 12/15/2023 02:49 PM EDT RP Workstation: [id number]   DG HIP UNILAT WITH PELVIS 2-3 VIEWS LEFT Result Date: 12/15/2023 EXAM: 2 or more VIEW(S) XRAY OF THE LEFT HIP 12/15/2023 01:50:00 PM COMPARISON: None available. CLINICAL HISTORY: Pt c/o left hip pain, was recently seen for same - no new fall or injury, increased left hip pain. FINDINGS: BONES AND JOINTS: Lytic lesion involving the medial aspect of the left femoral  neck with probable pathologic fracture and displacement of the lesser trochanter. SOFT TISSUES: The soft tissues are unremarkable. IMPRESSION: 1. Lytic lesion involving the medial aspect of the left femoral neck with probable pathologic fracture and displacement of the lesser trochanter. Electronically signed by: Lonni Necessary MD 12/15/2023 02:47 PM EDT RP Workstation: [id number]   DG Hips Bilat W or Wo Pelvis 3-4 Views Result Date: 12/03/2023 CLINICAL DATA:  Pain. EXAM: DG HIP (WITH OR WITHOUT PELVIS) 3-4V BILAT COMPARISON:  None Available. FINDINGS: There is no evidence of hip fracture or dislocation. Femoral heads are seated within the acetabula. Mild osteoarthritis of the bilateral hips. Sacroiliac  joints and pubic symphysis are anatomically aligned. IMPRESSION: 1. No acute osseous abnormality. 2. Mild osteoarthritis of the bilateral hips. Electronically Signed   By: Harrietta Sherry M.D.   On: 12/03/2023 17:06   DG Knee Complete 4 Views Right Result Date: 12/03/2023 CLINICAL DATA:  Pain.  Difficulty walking. EXAM: RIGHT KNEE - COMPLETE 4+ VIEW COMPARISON:  None Available. FINDINGS: No evidence of acute fracture, dislocation, or sizable joint effusion. Partially visualized cortical thickening of the right medial femoral diaphysis with indistinctness of the underlying corticomedullary junction. Mild medial femorotibial compartment joint space narrowing. Soft tissues are unremarkable. IMPRESSION: 1. Partially visualized cortical thickening of the medial right femoral diaphysis with indistinctness of the underlying corticomedullary junction. These findings are nonspecific and could reflect sequela of prior trauma such as stress injury, however, neoplasm cannot be entirely excluded. Recommend correlation with prior history. Consider further evaluation with MRI of the right femur. 2. No acute osseous abnormality. 3. Mild medial femorotibial compartment joint space narrowing. Electronically Signed   By:  Harrietta Sherry M.D.   On: 12/03/2023 17:05   DG Knee Complete 4 Views Left Result Date: 12/03/2023 CLINICAL DATA:  Pain.  Difficulty walking. EXAM: LEFT KNEE - COMPLETE 4+ VIEW COMPARISON:  None Available. FINDINGS: No evidence of acute fracture, dislocation, or sizable joint effusion. Mild medial femorotibial compartment joint space narrowing. Soft tissues are unremarkable. IMPRESSION: 1. No acute osseous abnormality. 2. Mild medial femorotibial compartment joint space narrowing. Electronically Signed   By: Harrietta Sherry M.D.   On: 12/03/2023 16:53   VAS US  LOWER EXTREMITY VENOUS (DVT) (ONLY [facility] & [facility]) Result Date: 12/03/2023  Lower Venous DVT Study Patient Name:  Bristyn E Venturino  Date of Exam:   12/03/2023 Medical Rec #: [id number]           Accession #:    [id number] Date of Birth: [birth year]            Patient Gender: F Patient Age:   76 years Exam Location:  [facility] Procedure:      VAS US  LOWER EXTREMITY VENOUS (DVT) Referring Phys: LAVONIA PAT --------------------------------------------------------------------------------  Indications: Pain.  Risk Factors: None identified. Comparison Study: No prior studies. Performing Technologist: Cordella Collet RVT  Examination Guidelines: A complete evaluation includes B-mode imaging, spectral Doppler, color Doppler, and power Doppler as needed of all accessible portions of each vessel. Bilateral testing is considered an integral part of a complete examination. Limited examinations for reoccurring indications may be performed as noted. The reflux portion of the exam is performed with the patient in reverse Trendelenburg.  +---------+---------------+---------+-----------+----------+--------------+ RIGHT    CompressibilityPhasicitySpontaneityPropertiesThrombus Aging +---------+---------------+---------+-----------+----------+--------------+ CFV      Full           Yes      Yes                                  +---------+---------------+---------+-----------+----------+--------------+ SFJ      Full                                                        +---------+---------------+---------+-----------+----------+--------------+ FV Prox  Full                                                        +---------+---------------+---------+-----------+----------+--------------+  FV Mid   Full                                                        +---------+---------------+---------+-----------+----------+--------------+ FV DistalFull                                                        +---------+---------------+---------+-----------+----------+--------------+ PFV      Full                                                        +---------+---------------+---------+-----------+----------+--------------+ POP      Full           Yes      Yes                                 +---------+---------------+---------+-----------+----------+--------------+ PTV      Full                                                        +---------+---------------+---------+-----------+----------+--------------+ PERO     Full                                                        +---------+---------------+---------+-----------+----------+--------------+   +---------+---------------+---------+-----------+----------+--------------+ LEFT     CompressibilityPhasicitySpontaneityPropertiesThrombus Aging +---------+---------------+---------+-----------+----------+--------------+ CFV      Full           Yes      Yes                                 +---------+---------------+---------+-----------+----------+--------------+ SFJ      Full                                                        +---------+---------------+---------+-----------+----------+--------------+ FV Prox  Full                                                         +---------+---------------+---------+-----------+----------+--------------+ FV Mid   Full                                                        +---------+---------------+---------+-----------+----------+--------------+  FV DistalFull                                                        +---------+---------------+---------+-----------+----------+--------------+ PFV      Full                                                        +---------+---------------+---------+-----------+----------+--------------+ POP      Full           Yes      Yes                                 +---------+---------------+---------+-----------+----------+--------------+ PTV      Full                                                        +---------+---------------+---------+-----------+----------+--------------+ PERO     Full                                                        +---------+---------------+---------+-----------+----------+--------------+     Summary: RIGHT: - There is no evidence of deep vein thrombosis in the lower extremity.  - No cystic structure found in the popliteal fossa.  LEFT: - There is no evidence of deep vein thrombosis in the lower extremity.  - No cystic structure found in the popliteal fossa.  *See table(s) above for measurements and observations. Electronically signed by Fonda Rim on 12/03/2023 at 4:46:32 PM.    Final     EKG/Medicine tests: Not indicated EKG Interpretation:    Interventions: Oxycodone , LR bolus, fentanyl   See the EMR for full details regarding lab and imaging results.  Patient presents for progressive pain, and has had significant decrease strength s worsening pain on the left side.  Notably patient was consented for MRI of the right hip during this presentation due to abnormal cortical thickening, however notably patient's left hip is the primary source of pain today.  Patient without other infectious symptoms, however given  worsening pain with movement, do feel the patient warrants repeat imaging as well as labs.  Will initially treat pain with oxycodone  p.o.  Given patient only has dizziness and nausea with severe pain, has no other associated symptoms, and symptoms occur contemporaneously minutes resolved spontaneously, do not feel the patient requires additional workup for other emergent etiologies of dizziness such as stroke.  Symptoms not consistent with peripheral causes of vertigo such as BPPV, Mnire's, etc., feel that these 2 symptoms are most likely pain related.  Patient with resolution of pain as well as dizziness after oxycodone .  X-rays obtained, and show lytic lesion and likely pathologic fracture of the lesser trochanter.  I personally reviewed prior x-rays from 8/18, and these were not present at that time.  Given  worsening pain, and this fracture not being present previously despite lack of trauma, x-rays of the femur and knee also obtained.  On radiology read there is note of likely lytic lesion of the proximal tibia, as well as lesion underlying the lesser trochanter.  Especially in combination with the contralateral concerns for lesion of the right femoral diaphysis, this is concerning for underlying malignancy with metastasis.  Did discuss pathologic fracture with patient at bedside, as well as concern for possible malignancy and possible concern for metastatic malignancy.  Will page orthopedic surgery.  Discussed with Dr. Sherida with [facility] to discuss need for operative intervention.  Per Dr. Sherida, patient potentially will need stabilization of this fracture as well as preventative stabilization of other lytic lesions evident on imaging (left tibia today, concern for lytic lesion on 8/18 on right femur), thus recommends transfer to [facility] for evaluation by orthopedic oncology.  I contacted [facility], and patient was accepted by Dr. Everet Don for ED to ED transfer for  subspecialty evaluation.  Patient reevaluated prior to transfer by [facility], hemodynamically stable, pain well-controlled after fentanyl .  Presentation is most consistent with acute life/limb-threatening illness  Discussion of management or test interpretations with external provider(s): Dr. Sherida with [facility], with first [facility] for subspecialty Ortho  Risk Drugs:OTC drugs, Prescription drug management, and Parenteral controlled substances Treatment: Decision regarding hospitalization  Disposition: Transfer to [facility] emergency department for subspecialty evaluation  MDM generated using voice dictation software and may contain dictation errors.  Please contact me for any clarification or with any questions.  Clinical Impression:  1. Left hip pain      Transfer via Transport   Final Clinical Impression(s) / ED Diagnoses Final diagnoses:  Left hip pain    Rx / DC Orders ED Discharge Orders     None        Rogelia Jerilynn RAMAN, MD 12/15/23 2041

## 2023-12-15 NOTE — ED Notes (Signed)
 Pt with L hip fx, takes diuretics at home. Purewick placed using nursing judgement.

## 2023-12-15 NOTE — ED Triage Notes (Signed)
 Pt bib EMS for left hip that started at least a week ago out of nowhere. She denies falls. Pt also reports dizziness. She states she was just seen for the same about a week ago.

## 2023-12-16 DIAGNOSIS — N2889 Other specified disorders of kidney and ureter: Secondary | ICD-10-CM | POA: Insufficient documentation

## 2023-12-16 DIAGNOSIS — E041 Nontoxic single thyroid nodule: Secondary | ICD-10-CM | POA: Insufficient documentation

## 2023-12-18 DIAGNOSIS — J9811 Atelectasis: Secondary | ICD-10-CM | POA: Insufficient documentation

## 2023-12-18 DIAGNOSIS — R918 Other nonspecific abnormal finding of lung field: Secondary | ICD-10-CM | POA: Insufficient documentation

## 2023-12-21 ENCOUNTER — Other Ambulatory Visit: Payer: Self-pay | Admitting: Cardiology

## 2024-01-02 ENCOUNTER — Ambulatory Visit: Admitting: Orthopaedic Surgery

## 2024-01-15 ENCOUNTER — Encounter: Payer: Self-pay | Admitting: *Deleted

## 2024-01-15 ENCOUNTER — Telehealth: Payer: Self-pay | Admitting: *Deleted

## 2024-01-15 MED ORDER — SACUBITRIL-VALSARTAN 24-26 MG PO TABS: 1.0000 | ORAL_TABLET | Freq: Two times a day (BID) | ORAL | 2 refills | Status: AC

## 2024-01-15 MED ORDER — SPIRONOLACTONE 25 MG PO TABS: 25.0000 mg | ORAL_TABLET | Freq: Every day | ORAL | 2 refills | Status: AC

## 2024-01-15 NOTE — Telephone Encounter (Signed)
 Rx refill sent to pharmacy.

## 2024-01-24 ENCOUNTER — Emergency Department (HOSPITAL_COMMUNITY)

## 2024-01-24 ENCOUNTER — Other Ambulatory Visit: Payer: Self-pay

## 2024-01-24 ENCOUNTER — Emergency Department (HOSPITAL_COMMUNITY): Admission: EM | Admit: 2024-01-24 | Discharge: 2024-01-24 | Disposition: A | Discharge status: Home / Self Care

## 2024-01-24 DIAGNOSIS — Z7982 Long term (current) use of aspirin: Secondary | ICD-10-CM | POA: Insufficient documentation

## 2024-01-24 DIAGNOSIS — Z859 Personal history of malignant neoplasm, unspecified: Secondary | ICD-10-CM | POA: Diagnosis not present

## 2024-01-24 DIAGNOSIS — M545 Low back pain, unspecified: Secondary | ICD-10-CM | POA: Insufficient documentation

## 2024-01-24 DIAGNOSIS — J168 Pneumonia due to other specified infectious organisms: Secondary | ICD-10-CM | POA: Insufficient documentation

## 2024-01-24 DIAGNOSIS — J189 Pneumonia, unspecified organism: Secondary | ICD-10-CM

## 2024-01-24 LAB — CBC WITH DIFFERENTIAL/PLATELET
Abs Immature Granulocytes: 0.06 K/uL (ref 0.00–0.07)
Basophils Absolute: 0 K/uL (ref 0.0–0.1)
Basophils Relative: 0 %
Eosinophils Absolute: 0 K/uL (ref 0.0–0.5)
Eosinophils Relative: 0 %
HCT: 36.3 % (ref 36.0–46.0)
Hemoglobin: 11 g/dL — ABNORMAL LOW (ref 12.0–15.0)
Immature Granulocytes: 1 %
Lymphocytes Relative: 5 %
Lymphs Abs: 0.6 K/uL — ABNORMAL LOW (ref 0.7–4.0)
MCH: 29.9 pg (ref 26.0–34.0)
MCHC: 30.3 g/dL (ref 30.0–36.0)
MCV: 98.6 fL (ref 80.0–100.0)
Monocytes Absolute: 0.9 K/uL (ref 0.1–1.0)
Monocytes Relative: 7 %
Neutro Abs: 11.5 K/uL — ABNORMAL HIGH (ref 1.7–7.7)
Neutrophils Relative %: 87 %
Platelets: 254 K/uL (ref 150–400)
RBC: 3.68 MIL/uL — ABNORMAL LOW (ref 3.87–5.11)
RDW: 14.7 % (ref 11.5–15.5)
WBC: 13.1 K/uL — ABNORMAL HIGH (ref 4.0–10.5)
nRBC: 0 % (ref 0.0–0.2)

## 2024-01-24 LAB — BASIC METABOLIC PANEL WITH GFR
Anion gap: 12 (ref 5–15)
BUN: 34 mg/dL — ABNORMAL HIGH (ref 8–23)
CO2: 25 mmol/L (ref 22–32)
Calcium: 9.7 mg/dL (ref 8.9–10.3)
Chloride: 97 mmol/L — ABNORMAL LOW (ref 98–111)
Creatinine, Ser: 1.21 mg/dL — ABNORMAL HIGH (ref 0.44–1.00)
GFR, Estimated: 47 mL/min — ABNORMAL LOW (ref >60)
Glucose, Bld: 117 mg/dL — ABNORMAL HIGH (ref 70–99)
Potassium: 4.7 mmol/L (ref 3.5–5.1)
Sodium: 134 mmol/L — ABNORMAL LOW (ref 135–145)

## 2024-01-24 MED ORDER — HYDROCODONE BIT-HOMATROP MBR 5-1.5 MG/5ML PO SOLN: 5.0000 mL | Freq: Four times a day (QID) | ORAL | 0 refills | Status: AC | PRN

## 2024-01-24 MED ORDER — AZITHROMYCIN 250 MG PO TABS
500.0000 mg | ORAL_TABLET | Freq: Once | ORAL | Status: AC
Start: 2024-01-24 — End: 2024-01-24
  Administered 2024-01-24: 500 mg via ORAL
  Filled 2024-01-24: qty 2

## 2024-01-24 MED ORDER — AMOXICILLIN-POT CLAVULANATE 875-125 MG PO TABS: 1.0000 | ORAL_TABLET | Freq: Two times a day (BID) | ORAL | 0 refills | Status: AC

## 2024-01-24 MED ORDER — FENTANYL CITRATE PF 50 MCG/ML IJ SOSY
50.0000 ug | PREFILLED_SYRINGE | Freq: Once | INTRAMUSCULAR | Status: AC
  Administered 2024-01-24: 50 ug via INTRAVENOUS
  Filled 2024-01-24: qty 1

## 2024-01-24 MED ORDER — AMOXICILLIN-POT CLAVULANATE 875-125 MG PO TABS
1.0000 | ORAL_TABLET | Freq: Once | ORAL | Status: AC
  Administered 2024-01-24: 1 via ORAL
  Filled 2024-01-24: qty 1

## 2024-01-24 MED ORDER — AZITHROMYCIN 250 MG PO TABS: 250.0000 mg | ORAL_TABLET | Freq: Every day | ORAL | 0 refills | Status: AC

## 2024-01-24 NOTE — ED Provider Notes (Signed)
 [facility] AT [facility] Provider Note   CSN: [id number] Arrival date & time: 01/24/24  9150     Patient presents with: Back Pain and Cough   [name] is a 73 y.o. female.   73 year old female presents for evaluation of back pain.  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  States she otherwise has back pain but today is much worse and she has had some trouble bending over.  Denies any falls or injury.  States the pain is from her lower back down to her tailbone.  She has been taking Tylenol  for her pain without relief.  Denies any other symptoms or concerns.   Back Pain Associated symptoms: no abdominal pain, no chest pain, no dysuria and no fever   Cough Associated symptoms: no chest pain, no chills, no ear pain, no fever, no rash, no shortness of breath and no sore throat        Prior to Admission medications   Medication Sig Start Date End Date Taking? Authorizing Provider  amoxicillin-clavulanate (AUGMENTIN) 875-125 MG tablet Take 1 tablet by mouth every 12 (twelve) hours. 01/24/24  Yes Pasha Gadison L, DO  azithromycin (ZITHROMAX) 250 MG tablet Take 1 tablet (250 mg total) by mouth daily for 4 days. Take first 2 tablets together, then 1 every day until finished. 01/24/24 01/28/24 Yes Emarion Toral L, DO  HYDROcodone bit-homatropine (HYCODAN) 5-1.5 MG/5ML syrup Take 5 mLs by mouth every 6 (six) hours as needed for up to 4 days for cough. 01/24/24 01/28/24 Yes Melonee Gerstel L, DO  acetaminophen  (TYLENOL ) 500 MG tablet Take 500 mg by mouth every 6 (six) hours as needed for mild pain (pain score 1-3) or moderate pain (pain score 4-6).    [provider]  Aflibercept  (EYLEA ) 2 MG/0.05ML SOLN 2 mg by Intravitreal route as directed. Every 10 weeks    [provider]  aspirin  81 MG EC tablet Take 81 mg by mouth every evening.     [provider]  Calcium-Magnesium-Vitamin D (CALCIUM 1200+D3 PO) Take 1 tablet by mouth daily. Calcium 1200, and 800 units Vit D 07/04/23   [provider]  carboxymethylcellulose (REFRESH PLUS) 0.5 % SOLN Place 1 drop into both eyes 3 (three) times daily as needed for dry eyes (dry eye).    [provider]  FARXIGA 5 MG TABS tablet Take 5 mg by mouth in the morning. 10/30/23   [provider]  ferrous sulfate  325 (65 FE) MG tablet Take 325 mg by mouth 3 (three) times a week. Monday, Wednesday, and Friday    [provider]  lidocaine  (LIDODERM ) 5 % Place 1 patch onto the skin daily. Remove & Discard patch within 12 hours or as directed by MD 12/03/23   Simon Lavonia SAILOR, MD  metoprolol  succinate (TOPROL -XL) 25 MG 24 hr tablet TAKE 1 TABLET BY MOUTH EVERY DAY WITH OR IMMEDIATELY FOLLOWING A MEAL 12/21/23   Revankar, Jennifer SAUNDERS, MD  Multiple Vitamins-Minerals (PRESERVISION AREDS 2+MULTI VIT) CAPS Take 1 Dose by mouth in the morning and at bedtime.    [provider]  nitroGLYCERIN  (NITROSTAT ) 0.4 MG SL tablet Place 0.4 mg under the tongue every 5 (five) minutes as needed for chest pain.    [provider]  sacubitril -valsartan  (ENTRESTO ) 24-26 MG Take 1 tablet by mouth 2 (two) times daily. 01/15/24  Revankar, Jennifer SAUNDERS, MD  simvastatin  (ZOCOR ) 20 MG tablet Take 20 mg by mouth every evening.     [provider]  spironolactone  (ALDACTONE ) 25 MG tablet Take 1 tablet (25 mg total) by mouth daily. 01/15/24   Revankar, Jennifer SAUNDERS, MD  torsemide  (DEMADEX ) 20 MG tablet Take 1.5 tablets (30 mg total) by mouth daily. Patient taking differently: Take 20 mg by mouth daily. 06/18/23   Revankar, Jennifer SAUNDERS, MD    Allergies: Patient has no known allergies.    Review of Systems  Constitutional:  Negative for chills and fever.  HENT:  Negative for ear pain and sore throat.   Eyes:  Negative for pain and visual disturbance.  Respiratory:  Positive for cough. Negative for  shortness of breath.   Cardiovascular:  Negative for chest pain and palpitations.  Gastrointestinal:  Negative for abdominal pain and vomiting.  Genitourinary:  Negative for dysuria and hematuria.  Musculoskeletal:  Positive for back pain. Negative for arthralgias.  Skin:  Negative for color change and rash.  Neurological:  Negative for seizures and syncope.  All other systems reviewed and are negative.   Updated Vital Signs BP (!) 111/50 (BP Location: Right Arm)   Pulse 87   Temp 97.8 F (36.6 C) (Oral)   Resp (!) 24   SpO2 93%   Physical Exam Vitals and nursing note reviewed.  Constitutional:      General: She is not in acute distress.    Appearance: Normal appearance. She is well-developed. She is ill-appearing.     Comments: Frail, chronically ill-appearing  HENT:     Head: Normocephalic and atraumatic.  Eyes:     Conjunctiva/sclera: Conjunctivae normal.  Cardiovascular:     Rate and Rhythm: Normal rate and regular rhythm.     Heart sounds: No murmur heard. Pulmonary:     Effort: Pulmonary effort is normal. No respiratory distress.     Breath sounds: Normal breath sounds.  Abdominal:     General: There is no distension.     Palpations: Abdomen is soft. There is no mass.     Tenderness: There is no abdominal tenderness.     Hernia: No hernia is present.  Musculoskeletal:        General: No swelling, tenderness or deformity. Normal range of motion.     Cervical back: Neck supple.  Skin:    General: Skin is warm and dry.     Capillary Refill: Capillary refill takes less than 2 seconds.  Neurological:     Mental Status: She is alert.  Psychiatric:        Mood and Affect: Mood normal.     (all labs ordered are listed, but only abnormal results are displayed) Labs Reviewed  BASIC METABOLIC PANEL WITH GFR - Abnormal; Notable for the following components:      Result Value   Sodium 134 (*)    Chloride 97 (*)    Glucose, Bld 117 (*)    BUN 34 (*)    Creatinine,  Ser 1.21 (*)    GFR, Estimated 47 (*)    All other components within normal limits  CBC WITH DIFFERENTIAL/PLATELET - Abnormal; Notable for the following components:   WBC 13.1 (*)    RBC 3.68 (*)    Hemoglobin 11.0 (*)    Neutro Abs 11.5 (*)    Lymphs Abs 0.6 (*)    All other components within normal limits    EKG: None  Radiology: DG Lumbar Spine Complete Result  Date: 01/24/2024 CLINICAL DATA:  Back pain. EXAM: LUMBAR SPINE - COMPLETE 4+ VIEW COMPARISON:  None Available. FINDINGS: Normal alignment of the lumbar vertebral bodies. No acute bony findings destructive bony lesions. Advanced aortoiliac vascular calcifications without definite aneurysm. Moderate air distended bowel could suggest an ileus. IMPRESSION: 1. Normal alignment and no acute bony findings. 2. Advanced aortoiliac vascular calcifications. 3. Moderate air distended bowel could suggest an ileus. Electronically Signed   By: MYRTIS Stammer M.D.   On: 01/24/2024 11:12   DG Sacrum/Coccyx Result Date: 01/24/2024 CLINICAL DATA:  Low back pain. EXAM: SACRUM AND COCCYX - 2+ VIEW COMPARISON:  None Available. FINDINGS: The pubic symphysis and SI joints are intact. No obvious bone lesions. Bilateral hip hardware noted along with vascular coils in the inguinal regions. The lateral film is over exposed most making it difficult to visualize the lower sacrum. IMPRESSION: 1. No acute bony findings. 2. Bilateral hip hardware. Electronically Signed   By: MYRTIS Stammer M.D.   On: 01/24/2024 11:11   DG Chest 1 View Result Date: 01/24/2024 CLINICAL DATA:  Nonproductive cough.  History of COVID. EXAM: DG CHEST 1V COMPARISON:  Chest x-ray from 2022 FINDINGS: The heart is normal in size. Slightly prominent left hilar contour. Calcification of the thoracic aorta. Multiple pulmonary nodules suggesting metastatic disease. There is also a left pleural effusion and possible left hilar mass or adenopathy and left upper lobe infiltrate core obstructive  atelectasis. The left mainstem bronchus is abnormal and likely at least partially obstructed likely by tumor. No obvious bone lesions. IMPRESSION: 1. Multiple pulmonary nodules suggesting metastatic disease. 2. Suspect left hilar mass and at least partially obstruction of the left mainstem bronchus with associated loss of volume in the left hemithorax and left pleural effusion. Recommend follow-up CT chest abdomen/pelvis with contrast for further evaluation with contrast for further evaluation. Electronically Signed   By: MYRTIS Stammer M.D.   On: 01/24/2024 11:10     Procedures   Medications Ordered in the ED  fentaNYL  (SUBLIMAZE ) injection 50 mcg (50 mcg Intravenous Given 01/24/24 1047)  amoxicillin-clavulanate (AUGMENTIN) 875-125 MG per tablet 1 tablet (1 tablet Oral Given 01/24/24 1246)  azithromycin (ZITHROMAX) tablet 500 mg (500 mg Oral Given 01/24/24 1246)                                    Medical Decision Making Cardiac monitor interpretation: Sinus rhythm, no ectopy  Patient's lab workup reviewed by me and fairly unremarkable she does have pneumonia on her x-ray.  Has a known lung mass.  Will treat her with antibiotics.  Will give her Augmentin and azithromycin.  Will give her her first dose here.  She received fluids and pain meds and feeling much improved.  Advise close up with primary care and given strict return precautions.  Advise any other over-the-counter medications as needed for her symptoms and to return to the ER for any new or worsening symptoms.  She feels comfortable being discharged home.  Problems Addressed: Acute bilateral low back pain without sciatica: acute illness or injury Pneumonia due to infectious organism, unspecified laterality, unspecified part of lung: acute illness or injury  Amount and/or Complexity of Data Reviewed External Data Reviewed: notes.    Details: Prior hospital records reviewed and patient has a history of orthopedic surgery procedures  unremarkable Labs: ordered. Decision-making details documented in ED Course.    Details: Ordered and reviewed by me  and fairly unremarkable Radiology: ordered and independent interpretation performed. Decision-making details documented in ED Course.    Details: Ordered and interpreted by me independently radiology Chest x-ray: Shows evidence of pneumonia and known lung mass Lumbar x-ray: There is no acute bony abnormality Sacrum and coccyx x-ray: Shows no acute bony abnormality  Risk OTC drugs. Prescription drug management. Parenteral controlled substances. Drug therapy requiring intensive monitoring for toxicity.     Final diagnoses:  Pneumonia due to infectious organism, unspecified laterality, unspecified part of lung  Acute bilateral low back pain without sciatica    ED Discharge Orders          Ordered    amoxicillin-clavulanate (AUGMENTIN) 875-125 MG tablet  Every 12 hours        01/24/24 1228    azithromycin (ZITHROMAX) 250 MG tablet  Daily        01/24/24 1228    HYDROcodone bit-homatropine (HYCODAN) 5-1.5 MG/5ML syrup  Every 6 hours PRN        01/24/24 1247               Kaitlin Ardito L, DO 01/24/24 1537

## 2024-01-24 NOTE — Discharge Instructions (Signed)
 Take your antibiotics as prescribed.  You can continue your pain medication and other medications as prescribed at home.  Follow-up with your primary care doctor in 1 to 2 weeks.  Return to the ER for new or worsening symptoms.

## 2024-01-24 NOTE — ED Triage Notes (Signed)
 Pt BIB EMS w complain of back pain. Stated it woke her up. Not taking any muscle relaxant.  Pt has non-productive cough d/t covid 10 days. Had rods placed in both legs [facility]. Pt has cancer.  BP: 104/54 O2 varies from 88-90% on RA HR: 70 irregular  R: 24  EMS stated she is a smoker, however, denied with me.

## 2024-02-18 ENCOUNTER — Encounter: Payer: Self-pay | Admitting: Radiology
